# Patient Record
Sex: Female | Born: 1945 | Race: Black or African American | Hispanic: No | Marital: Married | Smoking: Never smoker
Health system: Southern US, Community
[De-identification: ages and names within clinical notes are randomized; demographics above are authoritative.]

## PROBLEM LIST (undated history)

## (undated) DIAGNOSIS — C801 Malignant (primary) neoplasm, unspecified: Secondary | ICD-10-CM

## (undated) DIAGNOSIS — C50919 Malignant neoplasm of unspecified site of unspecified female breast: Secondary | ICD-10-CM

## (undated) DIAGNOSIS — K56609 Unspecified intestinal obstruction, unspecified as to partial versus complete obstruction: Secondary | ICD-10-CM

## (undated) DIAGNOSIS — T783XXA Angioneurotic edema, initial encounter: Secondary | ICD-10-CM

## (undated) DIAGNOSIS — T4145XA Adverse effect of unspecified anesthetic, initial encounter: Secondary | ICD-10-CM

## (undated) DIAGNOSIS — J189 Pneumonia, unspecified organism: Secondary | ICD-10-CM

## (undated) DIAGNOSIS — Z923 Personal history of irradiation: Secondary | ICD-10-CM

## (undated) DIAGNOSIS — F419 Anxiety disorder, unspecified: Secondary | ICD-10-CM

## (undated) DIAGNOSIS — T8859XA Other complications of anesthesia, initial encounter: Secondary | ICD-10-CM

## (undated) HISTORY — PX: ABDOMINAL HYSTERECTOMY: SHX81

## (undated) HISTORY — PX: BREAST SURGERY: SHX581

## (undated) HISTORY — PX: APPENDECTOMY: SHX54

## (undated) HISTORY — PX: CYST EXCISION: SHX5701

## (undated) HISTORY — PX: CHOLECYSTECTOMY: SHX55

---

## 2004-04-25 ENCOUNTER — Ambulatory Visit (HOSPITAL_COMMUNITY): Admission: RE | Admit: 2004-04-25 | Discharge: 2004-04-25 | Payer: Self-pay | Admitting: Gastroenterology

## 2011-06-16 DIAGNOSIS — C50919 Malignant neoplasm of unspecified site of unspecified female breast: Secondary | ICD-10-CM

## 2011-06-16 HISTORY — DX: Malignant neoplasm of unspecified site of unspecified female breast: C50.919

## 2011-06-16 HISTORY — PX: BREAST LUMPECTOMY: SHX2

## 2014-08-13 ENCOUNTER — Inpatient Hospital Stay (HOSPITAL_COMMUNITY)
Admission: EM | Admit: 2014-08-13 | Discharge: 2014-08-14 | DRG: 390 | Disposition: A | Discharge status: Home / Self Care | Payer: Medicare HMO | Attending: Internal Medicine | Admitting: Internal Medicine

## 2014-08-13 ENCOUNTER — Emergency Department (HOSPITAL_COMMUNITY): Payer: Medicare HMO

## 2014-08-13 ENCOUNTER — Encounter (HOSPITAL_COMMUNITY): Payer: Self-pay | Admitting: *Deleted

## 2014-08-13 ENCOUNTER — Inpatient Hospital Stay (HOSPITAL_COMMUNITY): Payer: Medicare HMO

## 2014-08-13 DIAGNOSIS — K5669 Other intestinal obstruction: Secondary | ICD-10-CM

## 2014-08-13 DIAGNOSIS — Z880 Allergy status to penicillin: Secondary | ICD-10-CM

## 2014-08-13 DIAGNOSIS — Z8679 Personal history of other diseases of the circulatory system: Secondary | ICD-10-CM

## 2014-08-13 DIAGNOSIS — R7989 Other specified abnormal findings of blood chemistry: Secondary | ICD-10-CM

## 2014-08-13 DIAGNOSIS — Z853 Personal history of malignant neoplasm of breast: Secondary | ICD-10-CM

## 2014-08-13 DIAGNOSIS — R109 Unspecified abdominal pain: Secondary | ICD-10-CM | POA: Diagnosis present

## 2014-08-13 DIAGNOSIS — R030 Elevated blood-pressure reading, without diagnosis of hypertension: Secondary | ICD-10-CM | POA: Diagnosis present

## 2014-08-13 DIAGNOSIS — Z91018 Allergy to other foods: Secondary | ICD-10-CM

## 2014-08-13 DIAGNOSIS — Z885 Allergy status to narcotic agent status: Secondary | ICD-10-CM | POA: Diagnosis not present

## 2014-08-13 DIAGNOSIS — K565 Intestinal adhesions [bands] with obstruction (postprocedural) (postinfection): Principal | ICD-10-CM | POA: Diagnosis present

## 2014-08-13 DIAGNOSIS — E876 Hypokalemia: Secondary | ICD-10-CM | POA: Diagnosis present

## 2014-08-13 DIAGNOSIS — Z87898 Personal history of other specified conditions: Secondary | ICD-10-CM

## 2014-08-13 DIAGNOSIS — Z4659 Encounter for fitting and adjustment of other gastrointestinal appliance and device: Secondary | ICD-10-CM

## 2014-08-13 DIAGNOSIS — K56609 Unspecified intestinal obstruction, unspecified as to partial versus complete obstruction: Secondary | ICD-10-CM | POA: Diagnosis present

## 2014-08-13 HISTORY — DX: Unspecified intestinal obstruction, unspecified as to partial versus complete obstruction: K56.609

## 2014-08-13 HISTORY — DX: Malignant (primary) neoplasm, unspecified: C80.1

## 2014-08-13 HISTORY — DX: Malignant neoplasm of unspecified site of unspecified female breast: C50.919

## 2014-08-13 HISTORY — DX: Angioneurotic edema, initial encounter: T78.3XXA

## 2014-08-13 LAB — CBC WITH DIFFERENTIAL/PLATELET
BASOS ABS: 0 10*3/uL (ref 0.0–0.1)
Basophils Absolute: 0 10*3/uL (ref 0.0–0.1)
Basophils Relative: 0 % (ref 0–1)
Basophils Relative: 0 % (ref 0–1)
EOS PCT: 0 % (ref 0–5)
Eosinophils Absolute: 0 10*3/uL (ref 0.0–0.7)
Eosinophils Absolute: 0 10*3/uL (ref 0.0–0.7)
Eosinophils Relative: 0 % (ref 0–5)
HCT: 40.3 % (ref 36.0–46.0)
HEMATOCRIT: 42.5 % (ref 36.0–46.0)
Hemoglobin: 13.6 g/dL (ref 12.0–15.0)
Hemoglobin: 14.3 g/dL (ref 12.0–15.0)
LYMPHS ABS: 1.7 10*3/uL (ref 0.7–4.0)
LYMPHS PCT: 18 % (ref 12–46)
Lymphocytes Relative: 13 % (ref 12–46)
Lymphs Abs: 1.1 10*3/uL (ref 0.7–4.0)
MCH: 29.8 pg (ref 26.0–34.0)
MCH: 30.4 pg (ref 26.0–34.0)
MCHC: 33.7 g/dL (ref 30.0–36.0)
MCHC: 33.6 g/dL (ref 30.0–36.0)
MCV: 88.2 fL (ref 78.0–100.0)
MCV: 90.4 fL (ref 78.0–100.0)
MONO ABS: 0 10*3/uL — AB (ref 0.1–1.0)
Monocytes Absolute: 0.2 10*3/uL (ref 0.1–1.0)
Monocytes Relative: 2 % — ABNORMAL LOW (ref 3–12)
Monocytes Relative: 1 % — ABNORMAL LOW (ref 3–12)
NEUTROS PCT: 80 % — AB (ref 43–77)
Neutro Abs: 7.4 10*3/uL (ref 1.7–7.7)
Neutro Abs: 7.7 10*3/uL (ref 1.7–7.7)
Neutrophils Relative %: 86 % — ABNORMAL HIGH (ref 43–77)
PLATELETS: 342 10*3/uL (ref 150–400)
Platelets: 351 10*3/uL (ref 150–400)
RBC: 4.57 MIL/uL (ref 3.87–5.11)
RBC: 4.7 MIL/uL (ref 3.87–5.11)
RDW: 13.6 % (ref 11.5–15.5)
RDW: 13.4 % (ref 11.5–15.5)
WBC: 9.3 10*3/uL (ref 4.0–10.5)
WBC: 8.8 10*3/uL (ref 4.0–10.5)

## 2014-08-13 LAB — COMPREHENSIVE METABOLIC PANEL
ALBUMIN: 3.6 g/dL (ref 3.5–5.2)
ALBUMIN: 3.4 g/dL — AB (ref 3.5–5.2)
ALK PHOS: 85 U/L (ref 39–117)
ALT: 14 U/L (ref 0–35)
ALT: 14 U/L (ref 0–35)
ANION GAP: 9 (ref 5–15)
AST: 22 U/L (ref 0–37)
AST: 28 U/L (ref 0–37)
Alkaline Phosphatase: 85 U/L (ref 39–117)
Anion gap: 8 (ref 5–15)
BILIRUBIN TOTAL: 0.8 mg/dL (ref 0.3–1.2)
BUN: 7 mg/dL (ref 6–23)
BUN: <5 mg/dL — ABNORMAL LOW (ref 6–23)
CHLORIDE: 101 mmol/L (ref 96–112)
CHLORIDE: 104 mmol/L (ref 96–112)
CO2: 27 mmol/L (ref 19–32)
CO2: 28 mmol/L (ref 19–32)
CREATININE: 0.82 mg/dL (ref 0.50–1.10)
Calcium: 9.7 mg/dL (ref 8.4–10.5)
Calcium: 9.4 mg/dL (ref 8.4–10.5)
Creatinine, Ser: 0.78 mg/dL (ref 0.50–1.10)
GFR calc Af Amer: 82 mL/min — ABNORMAL LOW (ref >90)
GFR calc Af Amer: >90 mL/min (ref >90)
GFR calc non Af Amer: 71 mL/min — ABNORMAL LOW (ref >90)
GFR calc non Af Amer: 83 mL/min — ABNORMAL LOW (ref >90)
GLUCOSE: 174 mg/dL — AB (ref 70–99)
Glucose, Bld: 174 mg/dL — ABNORMAL HIGH (ref 70–99)
Potassium: 3.3 mmol/L — ABNORMAL LOW (ref 3.5–5.1)
Potassium: 3.3 mmol/L — ABNORMAL LOW (ref 3.5–5.1)
Sodium: 137 mmol/L (ref 135–145)
Sodium: 140 mmol/L (ref 135–145)
Total Bilirubin: 0.9 mg/dL (ref 0.3–1.2)
Total Protein: 7.7 g/dL (ref 6.0–8.3)
Total Protein: 7.8 g/dL (ref 6.0–8.3)

## 2014-08-13 LAB — GLUCOSE, CAPILLARY
GLUCOSE-CAPILLARY: 146 mg/dL — AB (ref 70–99)
GLUCOSE-CAPILLARY: 121 mg/dL — AB (ref 70–99)

## 2014-08-13 LAB — I-STAT CG4 LACTIC ACID, ED
Lactic Acid, Venous: 3.1 mmol/L (ref 0.5–2.0)
Lactic Acid, Venous: 1.8 mmol/L (ref 0.5–2.0)

## 2014-08-13 LAB — LIPASE, BLOOD: Lipase: 24 U/L (ref 11–59)

## 2014-08-13 LAB — LACTIC ACID, PLASMA
LACTIC ACID, VENOUS: 2.4 mmol/L — AB (ref 0.5–2.0)
Lactic Acid, Venous: 2 mmol/L (ref 0.5–2.0)

## 2014-08-13 MED ORDER — ACETAMINOPHEN 325 MG PO TABS: 650.0000 mg | ORAL_TABLET | Freq: Four times a day (QID) | ORAL | Status: DC | PRN

## 2014-08-13 MED ORDER — ACETAMINOPHEN 650 MG RE SUPP: 650.0000 mg | Freq: Four times a day (QID) | RECTAL | Status: DC | PRN

## 2014-08-13 MED ORDER — SODIUM CHLORIDE 0.9 % IV SOLN
1000.0000 mL | Freq: Once | INTRAVENOUS | Status: AC
  Administered 2014-08-13: 1000 mL via INTRAVENOUS

## 2014-08-13 MED ORDER — ONDANSETRON HCL 4 MG/2ML IJ SOLN: 4.0000 mg | Freq: Four times a day (QID) | INTRAMUSCULAR | Status: DC | PRN

## 2014-08-13 MED ORDER — ONDANSETRON 4 MG PO TBDP
ORAL_TABLET | ORAL | Status: AC
  Filled 2014-08-13: qty 2

## 2014-08-13 MED ORDER — KCL IN DEXTROSE-NACL 20-5-0.9 MEQ/L-%-% IV SOLN
INTRAVENOUS | Status: AC
  Administered 2014-08-13 – 2014-08-14 (×3): via INTRAVENOUS
  Filled 2014-08-13 (×4): qty 1000

## 2014-08-13 MED ORDER — IOHEXOL 300 MG/ML  SOLN
25.0000 mL | Freq: Once | INTRAMUSCULAR | Status: AC | PRN
  Administered 2014-08-13: 25 mL via ORAL

## 2014-08-13 MED ORDER — ONDANSETRON 4 MG PO TBDP
8.0000 mg | ORAL_TABLET | Freq: Once | ORAL | Status: AC
  Administered 2014-08-13: 8 mg via ORAL

## 2014-08-13 MED ORDER — DIATRIZOATE MEGLUMINE & SODIUM 66-10 % PO SOLN
90.0000 mL | Freq: Once | ORAL | Status: AC
  Administered 2014-08-13: 90 mL via ORAL

## 2014-08-13 MED ORDER — IOHEXOL 300 MG/ML  SOLN
100.0000 mL | Freq: Once | INTRAMUSCULAR | Status: AC | PRN
  Administered 2014-08-13: 100 mL via INTRAVENOUS

## 2014-08-13 MED ORDER — LIDOCAINE VISCOUS 2 % MT SOLN
15.0000 mL | Freq: Once | OROMUCOSAL | Status: AC
  Administered 2014-08-13: 15 mL via OROMUCOSAL
  Filled 2014-08-13: qty 15

## 2014-08-13 MED ORDER — SODIUM CHLORIDE 0.9 % IV SOLN
1000.0000 mL | INTRAVENOUS | Status: DC
  Administered 2014-08-13: 1000 mL via INTRAVENOUS

## 2014-08-13 MED ORDER — ONDANSETRON HCL 4 MG PO TABS: 4.0000 mg | ORAL_TABLET | Freq: Four times a day (QID) | ORAL | Status: DC | PRN

## 2014-08-13 MED ORDER — METHYLPREDNISOLONE SODIUM SUCC 125 MG IJ SOLR
125.0000 mg | Freq: Once | INTRAMUSCULAR | Status: AC
  Administered 2014-08-13: 125 mg via INTRAVENOUS
  Filled 2014-08-13: qty 2

## 2014-08-13 MED ORDER — MORPHINE SULFATE 2 MG/ML IJ SOLN: 1.0000 mg | INTRAMUSCULAR | Status: DC | PRN

## 2014-08-13 MED ORDER — DIPHENHYDRAMINE HCL 50 MG/ML IJ SOLN
25.0000 mg | Freq: Once | INTRAMUSCULAR | Status: AC
  Administered 2014-08-13: 25 mg via INTRAVENOUS
  Filled 2014-08-13: qty 1

## 2014-08-13 NOTE — ED Notes (Signed)
MD Glick at bedside. 

## 2014-08-13 NOTE — ED Notes (Signed)
Multiple attempts made by myself to place NG tube without success.  Dr Hal Hope notified.  States to let her rest and have the floor try to place the tube.  Dione Plover from [employer] notified.

## 2014-08-13 NOTE — ED Notes (Signed)
Ambulated to the bathroom without difficulty.  States my pain is coming back but I can't take anything for pain.

## 2014-08-13 NOTE — ED Notes (Signed)
Attempts made to place NG tube twice, patient not tolerating well. Paged admitting MD to request numbing medication.

## 2014-08-13 NOTE — Progress Notes (Signed)
CRITICAL VALUE ALERT  Critical value received:  Lactic acid 2.4  Date of notification:  08/13/14  Time of notification:  9562  Critical value read back: Yes  Nurse who received alert:  Mayra Neer, RN  MD notified (1st page):    Time of first page:    MD notified (2nd page):  Time of second page:  Responding MD:   Time MD responded:

## 2014-08-13 NOTE — ED Notes (Signed)
Pt transported to CT ?

## 2014-08-13 NOTE — ED Notes (Signed)
CT notified patient is done with contrast

## 2014-08-13 NOTE — ED Provider Notes (Signed)
CSN: [id number]     Arrival date & time 08/13/14  0036 History  This chart was scribed for Tracy Fuel, MD by Randa Evens, ED Scribe. This patient was seen in room B14C/B14C and the patient's care was started at 1:29 AM.    Chief Complaint  Patient presents with  . Abdominal Pain   Patient is a 69 y.o. female presenting with abdominal pain. The history is provided by the patient. No language interpreter was used.  Abdominal Pain Associated symptoms: constipation, nausea and vomiting   Associated symptoms: no diarrhea    HPI Comments: Nathasha Fiorillo is a 69 y.o. female with PSHx hysterectomy and cholecystectomy who presents to the Emergency Department complaining of severe abdominal pain onset 1 day prior after eating a grilled cheese sandwich. Pt states she has associated nausea, vomiting and abdominal distention. Pt states she has a Hx of angio edema that begins in her intestines and will begin to radiate up into her throat. Pt states she has a hx of flu symptoms 3 weeks prior. Pt denies any medications PTA. Pt denies recent BM or flatus. Pt states that her symptoms feel similar to previous angioedema that improved after receiving benadryl and Solu-medrol. Pt denies diarrhea, or other related symptoms.    Past Medical History  Diagnosis Date  . Cancer    History reviewed. No pertinent past surgical history. No family history on file. History  Substance Use Topics  . Smoking status: Never Smoker   . Smokeless tobacco: Not on file  . Alcohol Use: Yes   OB History    No data available     Review of Systems  Gastrointestinal: Positive for nausea, vomiting, abdominal pain, constipation and abdominal distention. Negative for diarrhea.  All other systems reviewed and are negative.   Allergies  Codeine; Morphine and related; Other; and Penicillins  Home Medications   Prior to Admission medications   Not on File   BP 154/75 mmHg  Pulse 96  Temp(Src) 98.8 F (37.1  C)  Resp 16  Ht 5' 7.5" (1.715 m)  Wt 258 lb (117.028 kg)  BMI 39.79 kg/m2  SpO2 96%   Physical Exam  Constitutional: She is oriented to person, place, and time. She appears well-developed and well-nourished. No distress.  Appears uncomfortable and  intermittently vomiting   HENT:  Head: Normocephalic and atraumatic.  Eyes: Conjunctivae and EOM are normal. Pupils are equal, round, and reactive to light.  Neck: Normal range of motion. Neck supple. No JVD present.  Cardiovascular: Normal rate, regular rhythm and normal heart sounds.   No murmur heard. Pulmonary/Chest: Effort normal and breath sounds normal. She has no wheezes. She has no rales.  Abdominal: Soft. She exhibits no distension and no mass. Bowel sounds are decreased. There is tenderness.  Moderate tenderness across upper abdomen   Musculoskeletal: Normal range of motion. She exhibits no edema.  Lymphadenopathy:    She has no cervical adenopathy.  Neurological: She is alert and oriented to person, place, and time. No cranial nerve deficit. Coordination normal.  Skin: Skin is warm and dry. No rash noted.  Psychiatric: She has a normal mood and affect. Her behavior is normal. Judgment and thought content normal.  Nursing note and vitals reviewed.   ED Course  Procedures (including critical care time) DIAGNOSTIC STUDIES: Oxygen Saturation is 98% on RA, normal by my interpretation.    COORDINATION OF CARE: 1:39 AM-Discussed treatment plan with pt at bedside and pt agreed to plan.  Labs Review Results for orders placed or performed during the hospital encounter of 08/13/14  CBC with Differential  Result Value Ref Range   WBC 9.3 4.0 - 10.5 K/uL   RBC 4.57 3.87 - 5.11 MIL/uL   Hemoglobin 13.6 12.0 - 15.0 g/dL   HCT 40.3 36.0 - 46.0 %   MCV 88.2 78.0 - 100.0 fL   MCH 29.8 26.0 - 34.0 pg   MCHC 33.7 30.0 - 36.0 g/dL   RDW 13.6 11.5 - 15.5 %   Platelets 342 150 - 400 K/uL   Neutrophils Relative % 80 (H) 43 - 77  %   Neutro Abs 7.4 1.7 - 7.7 K/uL   Lymphocytes Relative 18 12 - 46 %   Lymphs Abs 1.7 0.7 - 4.0 K/uL   Monocytes Relative 2 (L) 3 - 12 %   Monocytes Absolute 0.2 0.1 - 1.0 K/uL   Eosinophils Relative 0 0 - 5 %   Eosinophils Absolute 0.0 0.0 - 0.7 K/uL   Basophils Relative 0 0 - 1 %   Basophils Absolute 0.0 0.0 - 0.1 K/uL  Comprehensive metabolic panel  Result Value Ref Range   Sodium 137 135 - 145 mmol/L   Potassium 3.3 (L) 3.5 - 5.1 mmol/L   Chloride 101 96 - 112 mmol/L   CO2 27 19 - 32 mmol/L   Glucose, Bld 174 (H) 70 - 99 mg/dL   BUN 7 6 - 23 mg/dL   Creatinine, Ser 0.82 0.50 - 1.10 mg/dL   Calcium 9.7 8.4 - 10.5 mg/dL   Total Protein 7.7 6.0 - 8.3 g/dL   Albumin 3.6 3.5 - 5.2 g/dL   AST 22 0 - 37 U/L   ALT 14 0 - 35 U/L   Alkaline Phosphatase 85 39 - 117 U/L   Total Bilirubin 0.9 0.3 - 1.2 mg/dL   GFR calc non Af Amer 71 (L) >90 mL/min   GFR calc Af Amer 82 (L) >90 mL/min   Anion gap 9 5 - 15  Lipase, blood  Result Value Ref Range   Lipase 24 11 - 59 U/L  I-Stat CG4 Lactic Acid, ED  Result Value Ref Range   Lactic Acid, Venous 3.10 (HH) 0.5 - 2.0 mmol/L   Comment NOTIFIED PHYSICIAN    Imaging Review Ct Abdomen Pelvis W Contrast  08/13/2014   CLINICAL DATA:  Abdominal pain, site unspecified  EXAM: CT ABDOMEN AND PELVIS WITH CONTRAST  TECHNIQUE: Multidetector CT imaging of the abdomen and pelvis was performed using the standard protocol following bolus administration of intravenous contrast.  CONTRAST:  125mL OMNIPAQUE IOHEXOL 300 MG/ML  SOLN  COMPARISON:  None.  FINDINGS: BODY WALL: Unremarkable.  LOWER CHEST: Reticulation the lower lungs is likely atelectasis.  ABDOMEN/PELVIS:  Liver: No focal abnormality.  Biliary: Cholecystectomy.  No biliary dilatation  Pancreas: Unremarkable.  Spleen: Unremarkable.  Adrenals: Unremarkable.  Kidneys and ureters: No hydronephrosis or stone.  Bladder: Unremarkable.  Reproductive: Hysterectomy and probable left oophorectomy. No pathologic  findings  Bowel: Dilated and fluid-filled small bowel with mesenteric edema, leading to a transition point in the right lower quadrant. Bowel caliber change occurs on image 61 were there is non circumferential mural enhancement with mild subsequent submucosal edema. The distal small bowel and colon is decompressed. No evidence of pneumatosis or perforation. Distal colonic diverticulosis.  Retroperitoneum: No mass or adenopathy.  Peritoneum: Trace pelvic and perihepatic ascites  Vascular: No acute abnormality.  OSSEOUS: No acute abnormalities.  IMPRESSION: Small bowel obstruction with ileal  transition point. Focal mural enhancement at the transition point is likely reactive, although underlying lesion cannot be excluded.   Electronically Signed   By: Monte Fantasia M.D.   On: 08/13/2014 04:02   Images viewed by me.  MDM   Final diagnoses:  Abdominal pain, unspecified abdominal location  Elevated lactic acid level      Abdominal pain of uncertain cause. She does have a history of angioedema which has involved the abdomen. However, she is also had multiple abdominal surgeries and is at risk for small bowel obstruction from adhesions. She states she is unable to take any kind of narcotic analgesic because of severe vomiting even if given antiemetics. She is given dose of methylprednisolone and diphenhydramine and is sent for CT of abdomen and pelvis.  Patient feels significantly better or she is continuing to have abdominal pain. CT scan shows small bowel obstruction with transition point in the right lower quadrant consistent with adhesions from prior abdominal surgery. Lactic acid level has come back slightly elevated. She has been given IV hydration lactic acid level be repeated. Case is discussed with Dr. Hal Hope of [facility] who agrees to admit the patient. He is requested that general surgery be made aware of the patient and case is discussed with Dr. Grandville Silos of [facility]  surgery.     I personally performed the services described in this documentation, which was scribed in my presence. The recorded information has been reviewed and is accurate.       Tracy Fuel, MD 12/45/80 9983

## 2014-08-13 NOTE — ED Notes (Signed)
CG-4 reported to Dr. Roxanne Mins

## 2014-08-13 NOTE — ED Notes (Signed)
The pt is c/o abd pain since yesterday.  She has just recently gotten over the flu.  She feels like her abd is bloated    She has a mega colon.Marland Kitchen  n v today.  She has a history of angio edema and sometimes she has abd bloating with that.  Exertional sob that subsides with rest.

## 2014-08-13 NOTE — Progress Notes (Signed)
[facility] PROGRESS NOTE  Tracy Howell YQM:[id number] DOB: 1945/07/14 DOA: 08/13/2014 PCP: Elyn Peers, MD  Assessment/Plan: 69 y/o female PMH of Appendectomy, Cholecystectomy and c section x2, hereditary angioedema, breast cancer s/p lumpectomy in 2013 admitted with abdominal pain and vomiting and found to have SBO; Last BM was on Saturday, no flatus since.She had multiple unsuccessful NGT attempts, not will go NGT under radiology   1. SBO; CT abd: Small bowel obstruction with ileal transition point. Focal mural enhancement at the transition point is likely reactive, although underlying lesion cannot be excluded -failed bedside NTG placement, will place under radiology guidance; management per surgery, appreciate input -patient does not want any medications due to previous angioedema (although reports only vomiting to opioids  2. Hypokalemia; patient is getting potassium in her IV fluids. Replace and recheck. Hypokalemia probably from vomiting 3. History of angioedema; no symptoms at this time; patient was given 1 dose of IV Solu-Medrol by the ER physician. At this time we'll closely observe. 4. Elevated BP likely in the setting of SBO/Pain, patient denies h/o HTN; will monitor; prn hydralazine as needed   Code Status: full Family Communication: d/w patient (indicate person spoken with, relationship, and if by phone, the number) Disposition Plan: pend clinical impriovement    Consultants:  Surgery   Procedures:  NGT 2/29  Antibiotics:  none (indicate start date, and stop date if known)  HPI/Subjective: alert  Objective: Filed Vitals:   08/13/14 0700  BP: 168/81  Pulse: 104  Temp: 98.3 F (36.8 C)  Resp: 20   No intake or output data in the 24 hours ending 08/13/14 1127 Filed Weights   08/13/14 0042  Weight: 117.028 kg (258 lb)    Exam:   General:  alert  Cardiovascular: s1,s2 rrr  Respiratory: CTA BL  Abdomen: soft, distended, mild  tender  Musculoskeletal: no leg edema   Data Reviewed: Basic Metabolic Panel:  Recent Labs Lab 08/13/14 0102 08/13/14 0818  NA 137 140  K 3.3* 3.3*  CL 101 104  CO2 27 28  GLUCOSE 174* 174*  BUN 7 <5*  CREATININE 0.82 0.78  CALCIUM 9.7 9.4   Liver Function Tests:  Recent Labs Lab 08/13/14 0102 08/13/14 0818  AST 22 28  ALT 14 14  ALKPHOS 85 85  BILITOT 0.9 0.8  PROT 7.7 7.8  ALBUMIN 3.6 3.4*    Recent Labs Lab 08/13/14 0102  LIPASE 24   No results for input(s): AMMONIA in the last 168 hours. CBC:  Recent Labs Lab 08/13/14 0102 08/13/14 0818  WBC 9.3 8.8  NEUTROABS 7.4 7.7  HGB 13.6 14.3  HCT 40.3 42.5  MCV 88.2 90.4  PLT 342 351   Cardiac Enzymes: No results for input(s): CKTOTAL, CKMB, CKMBINDEX, TROPONINI in the last 168 hours. BNP (last 3 results) No results for input(s): BNP in the last 8760 hours.  ProBNP (last 3 results) No results for input(s): PROBNP in the last 8760 hours.  CBG: No results for input(s): GLUCAP in the last 168 hours.  No results found for this or any previous visit (from the past 240 hour(s)).   Studies: Ct Abdomen Pelvis W Contrast  08/13/2014   CLINICAL DATA:  Abdominal pain, site unspecified  EXAM: CT ABDOMEN AND PELVIS WITH CONTRAST  TECHNIQUE: Multidetector CT imaging of the abdomen and pelvis was performed using the standard protocol following bolus administration of intravenous contrast.  CONTRAST:  111mL OMNIPAQUE IOHEXOL 300 MG/ML  SOLN  COMPARISON:  None.  FINDINGS: BODY  WALL: Unremarkable.  LOWER CHEST: Reticulation the lower lungs is likely atelectasis.  ABDOMEN/PELVIS:  Liver: No focal abnormality.  Biliary: Cholecystectomy.  No biliary dilatation  Pancreas: Unremarkable.  Spleen: Unremarkable.  Adrenals: Unremarkable.  Kidneys and ureters: No hydronephrosis or stone.  Bladder: Unremarkable.  Reproductive: Hysterectomy and probable left oophorectomy. No pathologic findings  Bowel: Dilated and fluid-filled small  bowel with mesenteric edema, leading to a transition point in the right lower quadrant. Bowel caliber change occurs on image 61 were there is non circumferential mural enhancement with mild subsequent submucosal edema. The distal small bowel and colon is decompressed. No evidence of pneumatosis or perforation. Distal colonic diverticulosis.  Retroperitoneum: No mass or adenopathy.  Peritoneum: Trace pelvic and perihepatic ascites  Vascular: No acute abnormality.  OSSEOUS: No acute abnormalities.  IMPRESSION: Small bowel obstruction with ileal transition point. Focal mural enhancement at the transition point is likely reactive, although underlying lesion cannot be excluded.   Electronically Signed   By: Monte Fantasia M.D.   On: 08/13/2014 04:02    Scheduled Meds:  Continuous Infusions: . dextrose 5 % and 0.9 % NaCl with KCl 20 mEq/L      Principal Problem:   SBO (small bowel obstruction) Active Problems:   History of angioedema   History of breast cancer    Time spent: >35 minutes     Kinnie Feil  [facility] Pager [phone]. If 7PM-7AM, please contact night-coverage at [email or web address], password [email or web address] 08/13/2014, 11:27 AM  LOS: 0 days

## 2014-08-13 NOTE — H&P (Signed)
[facility] History and Physical  Tracy Howell GXQ:[id number] DOB: [birth year] DOA: 08/13/2014  Referring physician: ER physician. PCP: Tracy Peers, MD   Chief Complaint: Abdominal pain with nausea and vomiting.  HPI: Tracy Howell is a 69 y.o. female with history of angioedema and breast cancer status post surgery presents to the ER because of abdominal pain. Patient states abdominal pain started 2 days ago with nausea and vomiting and diarrhea. But subsequently patient has not had any bowel movements and had multiple episodes of nausea vomiting with persistent abdominal pain. In the ER patient's CT abdomen and pelvis shows small bowel obstruction with transition point. Patient is still having vomiting. At this time NG tube is in place and I have consulted Dr. Grandville Silos surgeon on call for further recommendations. Patient denies any chest pain or shortness of breath.   Review of Systems: As presented in the history of presenting illness, rest negative.  Past Medical History  Diagnosis Date  . Cancer   . Angioedema    Past Surgical History  Procedure Laterality Date  . Breast surgery    . Cholecystectomy    . Appendectomy     Social History:  reports that she has never smoked. She does not have any smokeless tobacco history on file. She reports that she does not drink alcohol. Her drug history is not on file. Where does patient live home. Can patient participate in ADLs? Yes.  Allergies  Allergen Reactions  . Codeine Nausea And Vomiting  . Morphine And Related Nausea And Vomiting  . Other Nausea And Vomiting    Onions,green peppers and mushrooms  . Penicillins Rash    vomiting    Family History:  Family History  Problem Relation Age of Onset  . Angioedema Mother       Prior to Admission medications   Not on File    Physical Exam: Filed Vitals:   08/13/14 0404 08/13/14 0430 08/13/14 0500 08/13/14 0530  BP: 169/83 142/80 163/50 151/85   Pulse: 117 100 104 103  Temp:      Resp: 34 18 24 20   Height:      Weight:      SpO2: 96% 98% 95% 96%     General:  Well-developed and nourished.  Eyes: Anicteric no pallor.  ENT: No discharge from the ears eyes nose and mouth.  Neck: No mass felt.  Cardiovascular: S1-S2 heard.  Respiratory: No rhonchi or crepitations.  Abdomen: Distended with bowel sounds not appreciated no guarding or rigidity.  Skin: No rash.  Musculoskeletal: No edema.  Psychiatric: Appears normal.  Neurologic: Alert awake oriented to time place and person. Moves all extremities.  Labs on Admission:  Basic Metabolic Panel:  Recent Labs Lab 08/13/14 0102  NA 137  K 3.3*  CL 101  CO2 27  GLUCOSE 174*  BUN 7  CREATININE 0.82  CALCIUM 9.7   Liver Function Tests:  Recent Labs Lab 08/13/14 0102  AST 22  ALT 14  ALKPHOS 85  BILITOT 0.9  PROT 7.7  ALBUMIN 3.6    Recent Labs Lab 08/13/14 0102  LIPASE 24   No results for input(s): AMMONIA in the last 168 hours. CBC:  Recent Labs Lab 08/13/14 0102  WBC 9.3  NEUTROABS 7.4  HGB 13.6  HCT 40.3  MCV 88.2  PLT 342   Cardiac Enzymes: No results for input(s): CKTOTAL, CKMB, CKMBINDEX, TROPONINI in the last 168 hours.  BNP (last 3 results) No results for input(s): BNP in the  last 8760 hours.  ProBNP (last 3 results) No results for input(s): PROBNP in the last 8760 hours.  CBG: No results for input(s): GLUCAP in the last 168 hours.  Radiological Exams on Admission: Ct Abdomen Pelvis W Contrast  08/13/2014   CLINICAL DATA:  Abdominal pain, site unspecified  EXAM: CT ABDOMEN AND PELVIS WITH CONTRAST  TECHNIQUE: Multidetector CT imaging of the abdomen and pelvis was performed using the standard protocol following bolus administration of intravenous contrast.  CONTRAST:  17mL OMNIPAQUE IOHEXOL 300 MG/ML  SOLN  COMPARISON:  None.  FINDINGS: BODY WALL: Unremarkable.  LOWER CHEST: Reticulation the lower lungs is likely  atelectasis.  ABDOMEN/PELVIS:  Liver: No focal abnormality.  Biliary: Cholecystectomy.  No biliary dilatation  Pancreas: Unremarkable.  Spleen: Unremarkable.  Adrenals: Unremarkable.  Kidneys and ureters: No hydronephrosis or stone.  Bladder: Unremarkable.  Reproductive: Hysterectomy and probable left oophorectomy. No pathologic findings  Bowel: Dilated and fluid-filled small bowel with mesenteric edema, leading to a transition point in the right lower quadrant. Bowel caliber change occurs on image 61 were there is non circumferential mural enhancement with mild subsequent submucosal edema. The distal small bowel and colon is decompressed. No evidence of pneumatosis or perforation. Distal colonic diverticulosis.  Retroperitoneum: No mass or adenopathy.  Peritoneum: Trace pelvic and perihepatic ascites  Vascular: No acute abnormality.  OSSEOUS: No acute abnormalities.  IMPRESSION: Small bowel obstruction with ileal transition point. Focal mural enhancement at the transition point is likely reactive, although underlying lesion cannot be excluded.   Electronically Signed   By: Monte Fantasia M.D.   On: 08/13/2014 04:02    Assessment/Plan Principal Problem:   SBO (small bowel obstruction) Active Problems:   History of angioedema   History of breast cancer   1. Small bowel obstruction - I have consulted Dr. Grandville Silos on-call surgeon who will be seeing patient in consult. Patient will be kept nothing by mouth and NG tube suction. Gently hydrate. Pain related medications. 2. Mild hypokalemia - patient is getting potassium in her IV fluids. Replace and recheck. Hypokalemia probably from vomiting. 3. History of angioedema - patient was given 1 dose of IV Solu-Medrol by the ER physician. At this time we'll closely observe. 4. History of breast cancer.   DVT Prophylaxis SCDs.  Code Status: Full code.  Family Communication: None.  Disposition Plan: Admit to inpatient.    Carlean Crowl N. [facility] Pager [phone].  If 7PM-7AM, please contact night-coverage [email or web address] Password [facility] 08/13/2014, 5:55 AM

## 2014-08-13 NOTE — Consult Note (Signed)
Reason for Consult: SBO Referring Physician: Dr. Gean Birchwood    HPI: Tracy Howell is a 69 year old female with a history of appendectomy, cholecystectomy and c section x2, angioedema, breast cancer s/p lumpectomy in 2013 admitted with abdominal pain and vomiting.  Symptoms initially started 3 weeks ago with diarrhea and vomiting, this resolved about 1 week ago.  The patient continued to consume a clear liquid diet until Saturday at which time she had a grilled cheese and developed bloating, pain and vomiting about 30 minutes afterwards.  Last BM was on Saturday, no flatus since.  No modifying factors.  No aggravating or alleviating factors.  Moderate in severity.  Location is generalized abdomen, more in the epigastric region.  She cannon tolerate any pain medication due to vomiting, discussed that its a common side effect, can give zofran, the patient declines pain medication at this time.    She continues to vomit bilious content.  She had multiple unsuccessful NGT attempts, now having some bleeding.  She denies fever or chills.  Denies recent weight loss.  Her work up shows SBO with transition point at the ileum.  We have therefore been asked to evaluate the patient.    Past Medical History  Diagnosis Date  . Cancer   . Angioedema   . Breast cancer 2013    Past Surgical History  Procedure Laterality Date  . Breast surgery    . Cholecystectomy    . Appendectomy    . Breast lumpectomy  2013  . Cesarean section      x2    Family History  Problem Relation Age of Onset  . Angioedema Mother     Social History:  reports that she has never smoked. She does not have any smokeless tobacco history on file. She reports that she does not drink alcohol or use illicit drugs.  Allergies:  Allergies  Allergen Reactions  . Codeine Nausea And Vomiting  . Morphine And Related Nausea And Vomiting  . Other Nausea And Vomiting    Onions,green peppers and mushrooms  . Penicillins Rash   vomiting    Medications:  No current facility-administered medications on file prior to encounter.   No current outpatient prescriptions on file prior to encounter.     Results for orders placed or performed during the hospital encounter of 08/13/14 (from the past 48 hour(s))  CBC with Differential     Status: Abnormal   Collection Time: 08/13/14  1:02 AM  Result Value Ref Range   WBC 9.3 4.0 - 10.5 K/uL   RBC 4.57 3.87 - 5.11 MIL/uL   Hemoglobin 13.6 12.0 - 15.0 g/dL   HCT 40.3 36.0 - 46.0 %   MCV 88.2 78.0 - 100.0 fL   MCH 29.8 26.0 - 34.0 pg   MCHC 33.7 30.0 - 36.0 g/dL   RDW 13.6 11.5 - 15.5 %   Platelets 342 150 - 400 K/uL   Neutrophils Relative % 80 (H) 43 - 77 %   Neutro Abs 7.4 1.7 - 7.7 K/uL   Lymphocytes Relative 18 12 - 46 %   Lymphs Abs 1.7 0.7 - 4.0 K/uL   Monocytes Relative 2 (L) 3 - 12 %   Monocytes Absolute 0.2 0.1 - 1.0 K/uL   Eosinophils Relative 0 0 - 5 %   Eosinophils Absolute 0.0 0.0 - 0.7 K/uL   Basophils Relative 0 0 - 1 %   Basophils Absolute 0.0 0.0 - 0.1 K/uL  Comprehensive metabolic panel  Status: Abnormal   Collection Time: 08/13/14  1:02 AM  Result Value Ref Range   Sodium 137 135 - 145 mmol/L   Potassium 3.3 (L) 3.5 - 5.1 mmol/L   Chloride 101 96 - 112 mmol/L   CO2 27 19 - 32 mmol/L   Glucose, Bld 174 (H) 70 - 99 mg/dL   BUN 7 6 - 23 mg/dL   Creatinine, Ser 0.82 0.50 - 1.10 mg/dL   Calcium 9.7 8.4 - 10.5 mg/dL   Total Protein 7.7 6.0 - 8.3 g/dL   Albumin 3.6 3.5 - 5.2 g/dL   AST 22 0 - 37 U/L   ALT 14 0 - 35 U/L   Alkaline Phosphatase 85 39 - 117 U/L   Total Bilirubin 0.9 0.3 - 1.2 mg/dL   GFR calc non Af Amer 71 (L) >90 mL/min   GFR calc Af Amer 82 (L) >90 mL/min    Comment: (NOTE) The eGFR has been calculated using the CKD EPI equation. This calculation has not been validated in all clinical situations. eGFR's persistently <90 mL/min signify possible Chronic Kidney Disease.    Anion gap 9 5 - 15  Lipase, blood     Status:  None   Collection Time: 08/13/14  1:02 AM  Result Value Ref Range   Lipase 24 11 - 59 U/L  I-Stat CG4 Lactic Acid, ED     Status: Abnormal   Collection Time: 08/13/14  1:44 AM  Result Value Ref Range   Lactic Acid, Venous 3.10 (HH) 0.5 - 2.0 mmol/L   Comment NOTIFIED PHYSICIAN   I-Stat CG4 Lactic Acid, ED     Status: None   Collection Time: 08/13/14  5:14 AM  Result Value Ref Range   Lactic Acid, Venous 1.80 0.5 - 2.0 mmol/L  Comprehensive metabolic panel     Status: Abnormal   Collection Time: 08/13/14  8:18 AM  Result Value Ref Range   Sodium 140 135 - 145 mmol/L   Potassium 3.3 (L) 3.5 - 5.1 mmol/L   Chloride 104 96 - 112 mmol/L   CO2 28 19 - 32 mmol/L   Glucose, Bld 174 (H) 70 - 99 mg/dL   BUN <5 (L) 6 - 23 mg/dL   Creatinine, Ser 0.78 0.50 - 1.10 mg/dL   Calcium 9.4 8.4 - 10.5 mg/dL   Total Protein 7.8 6.0 - 8.3 g/dL   Albumin 3.4 (L) 3.5 - 5.2 g/dL   AST 28 0 - 37 U/L   ALT 14 0 - 35 U/L   Alkaline Phosphatase 85 39 - 117 U/L   Total Bilirubin 0.8 0.3 - 1.2 mg/dL   GFR calc non Af Amer 83 (L) >90 mL/min   GFR calc Af Amer >90 >90 mL/min    Comment: (NOTE) The eGFR has been calculated using the CKD EPI equation. This calculation has not been validated in all clinical situations. eGFR's persistently <90 mL/min signify possible Chronic Kidney Disease.    Anion gap 8 5 - 15  CBC with Differential/Platelet     Status: Abnormal   Collection Time: 08/13/14  8:18 AM  Result Value Ref Range   WBC 8.8 4.0 - 10.5 K/uL   RBC 4.70 3.87 - 5.11 MIL/uL   Hemoglobin 14.3 12.0 - 15.0 g/dL   HCT 42.5 36.0 - 46.0 %   MCV 90.4 78.0 - 100.0 fL   MCH 30.4 26.0 - 34.0 pg   MCHC 33.6 30.0 - 36.0 g/dL   RDW 13.4 11.5 - 15.5 %  Platelets 351 150 - 400 K/uL   Neutrophils Relative % 86 (H) 43 - 77 %   Neutro Abs 7.7 1.7 - 7.7 K/uL   Lymphocytes Relative 13 12 - 46 %   Lymphs Abs 1.1 0.7 - 4.0 K/uL   Monocytes Relative 1 (L) 3 - 12 %   Monocytes Absolute 0.0 (L) 0.1 - 1.0 K/uL    Eosinophils Relative 0 0 - 5 %   Eosinophils Absolute 0.0 0.0 - 0.7 K/uL   Basophils Relative 0 0 - 1 %   Basophils Absolute 0.0 0.0 - 0.1 K/uL  Lactic acid, plasma     Status: None   Collection Time: 08/13/14  8:18 AM  Result Value Ref Range   Lactic Acid, Venous 2.0 0.5 - 2.0 mmol/L    Ct Abdomen Pelvis W Contrast  08/13/2014   CLINICAL DATA:  Abdominal pain, site unspecified  EXAM: CT ABDOMEN AND PELVIS WITH CONTRAST  TECHNIQUE: Multidetector CT imaging of the abdomen and pelvis was performed using the standard protocol following bolus administration of intravenous contrast.  CONTRAST:  121mL OMNIPAQUE IOHEXOL 300 MG/ML  SOLN  COMPARISON:  None.  FINDINGS: BODY WALL: Unremarkable.  LOWER CHEST: Reticulation the lower lungs is likely atelectasis.  ABDOMEN/PELVIS:  Liver: No focal abnormality.  Biliary: Cholecystectomy.  No biliary dilatation  Pancreas: Unremarkable.  Spleen: Unremarkable.  Adrenals: Unremarkable.  Kidneys and ureters: No hydronephrosis or stone.  Bladder: Unremarkable.  Reproductive: Hysterectomy and probable left oophorectomy. No pathologic findings  Bowel: Dilated and fluid-filled small bowel with mesenteric edema, leading to a transition point in the right lower quadrant. Bowel caliber change occurs on image 61 were there is non circumferential mural enhancement with mild subsequent submucosal edema. The distal small bowel and colon is decompressed. No evidence of pneumatosis or perforation. Distal colonic diverticulosis.  Retroperitoneum: No mass or adenopathy.  Peritoneum: Trace pelvic and perihepatic ascites  Vascular: No acute abnormality.  OSSEOUS: No acute abnormalities.  IMPRESSION: Small bowel obstruction with ileal transition point. Focal mural enhancement at the transition point is likely reactive, although underlying lesion cannot be excluded.   Electronically Signed   By: Monte Fantasia M.D.   On: 08/13/2014 04:02    Review of Systems  All other systems reviewed and  are negative.  Blood pressure 168/81, pulse 104, temperature 98.3 F (36.8 C), temperature source Oral, resp. rate 20, height 5' 7.5" (1.715 m), weight 258 lb (117.028 kg), SpO2 98 %. Physical Exam  Constitutional: She is oriented to person, place, and time. She appears well-developed and well-nourished. No distress.  HENT:  Head: Atraumatic.  Eyes: Right eye exhibits no discharge. Left eye exhibits no discharge. No scleral icterus.  Cardiovascular: Normal rate, regular rhythm, normal heart sounds and intact distal pulses.  Exam reveals no gallop and no friction rub.   No murmur heard. Respiratory: Effort normal and breath sounds normal. No respiratory distress. She has no wheezes. She has no rales. She exhibits no tenderness.  GI: Soft. Bowel sounds are normal. She exhibits distension. She exhibits no mass. There is tenderness. There is no rebound and no guarding.  Neurological: She is alert and oriented to person, place, and time.  Skin: Skin is warm and dry. No rash noted. She is not diaphoretic. No erythema. No pallor.  Psychiatric: She has a normal mood and affect. Her behavior is normal. Judgment and thought content normal.    Assessment/Plan: HD#1 SBO likely secondary to adhesions -will place NGT under fluoro -once inserted, will start  the SBO protocol---NGT to LWIS x2 hours, then give gastrografin, clamp x1 hour, repeat AXR 8 hours after gastrografin has been given.   -SCD, mobilize, may start chemical VTE prophylaxis -IVF, monitor electrolytes -pain control/antiemetics  Lyndon Chapel ANP-BC 08/13/2014, 9:45 AM

## 2014-08-14 ENCOUNTER — Encounter (HOSPITAL_COMMUNITY): Payer: Self-pay | Admitting: General Practice

## 2014-08-14 LAB — CBC
HEMATOCRIT: 37.6 % (ref 36.0–46.0)
Hemoglobin: 12.3 g/dL (ref 12.0–15.0)
MCH: 30.1 pg (ref 26.0–34.0)
MCHC: 32.7 g/dL (ref 30.0–36.0)
MCV: 91.9 fL (ref 78.0–100.0)
PLATELETS: 287 10*3/uL (ref 150–400)
RBC: 4.09 MIL/uL (ref 3.87–5.11)
RDW: 14 % (ref 11.5–15.5)
WBC: 10.2 10*3/uL (ref 4.0–10.5)

## 2014-08-14 LAB — GLUCOSE, CAPILLARY: Glucose-Capillary: 132 mg/dL — ABNORMAL HIGH (ref 70–99)

## 2014-08-14 LAB — BASIC METABOLIC PANEL
Anion gap: 10 (ref 5–15)
BUN: 9 mg/dL (ref 6–23)
CO2: 25 mmol/L (ref 19–32)
CREATININE: 0.77 mg/dL (ref 0.50–1.10)
Calcium: 8.4 mg/dL (ref 8.4–10.5)
Chloride: 108 mmol/L (ref 96–112)
GFR calc Af Amer: >90 mL/min (ref >90)
GFR calc non Af Amer: 83 mL/min — ABNORMAL LOW (ref >90)
Glucose, Bld: 123 mg/dL — ABNORMAL HIGH (ref 70–99)
Potassium: 3.5 mmol/L (ref 3.5–5.1)
Sodium: 143 mmol/L (ref 135–145)

## 2014-08-14 NOTE — Progress Notes (Signed)
Patient ID: Tracy Howell, female   DOB: [birth year], 69 y.o.   MRN: [id number]     [facility]      [address] [place]   [place], [place] [address]    Phone: [phone] FAX: [phone]     Subjective: NGT is out.  No n/v.  Having BMs.  SBO protocol done--contrast in colon.  Labs reviewed, normal.   Objective:  Vital signs:  Filed Vitals:   08/13/14 0649 08/13/14 0700 08/13/14 2127 08/14/14 0508  BP:  168/81 126/56 132/64  Pulse:  104 90 100  Temp: 98.8 F (37.1 C) 98.3 F (36.8 C) 98.5 F (36.9 C) 98.5 F (36.9 C)  TempSrc: Oral  Oral Oral  Resp:  20 19 18   Height:      Weight:      SpO2:  98% 94% 95%    Last BM Date: 08/14/14  Intake/Output   Yesterday:    This shift:      Physical Exam: General: Pt awake/alert/oriented x4 in no acute distress Abdomen: Soft.  Nondistended.  Non tender.  No evidence of peritonitis.  No incarcerated hernias.    Problem List:   Principal Problem:   SBO (small bowel obstruction) Active Problems:   History of angioedema   History of breast cancer    Results:   Labs: Results for orders placed or performed during the hospital encounter of 08/13/14 (from the past 48 hour(s))  CBC with Differential     Status: Abnormal   Collection Time: 08/13/14  1:02 AM  Result Value Ref Range   WBC 9.3 4.0 - 10.5 K/uL   RBC 4.57 3.87 - 5.11 MIL/uL   Hemoglobin 13.6 12.0 - 15.0 g/dL   HCT 08/15/14 87.9 - 23.2 %   MCV 88.2 78.0 - 100.0 fL   MCH 29.8 26.0 - 34.0 pg   MCHC 33.7 30.0 - 36.0 g/dL   RDW 49.2 76.4 - 79.3 %   Platelets 342 150 - 400 K/uL   Neutrophils Relative % 80 (H) 43 - 77 %   Neutro Abs 7.4 1.7 - 7.7 K/uL   Lymphocytes Relative 18 12 - 46 %   Lymphs Abs 1.7 0.7 - 4.0 K/uL   Monocytes Relative 2 (L) 3 - 12 %   Monocytes Absolute 0.2 0.1 - 1.0 K/uL   Eosinophils Relative 0 0 - 5 %   Eosinophils Absolute 0.0 0.0 - 0.7 K/uL   Basophils Relative 0 0 - 1 %   Basophils  Absolute 0.0 0.0 - 0.1 K/uL  Comprehensive metabolic panel     Status: Abnormal   Collection Time: 08/13/14  1:02 AM  Result Value Ref Range   Sodium 137 135 - 145 mmol/L   Potassium 3.3 (L) 3.5 - 5.1 mmol/L   Chloride 101 96 - 112 mmol/L   CO2 27 19 - 32 mmol/L   Glucose, Bld 174 (H) 70 - 99 mg/dL   BUN 7 6 - 23 mg/dL   Creatinine, Ser 08/15/14 0.50 - 1.10 mg/dL   Calcium 9.7 8.4 - 2.69 mg/dL   Total Protein 7.7 6.0 - 8.3 g/dL   Albumin 3.6 3.5 - 5.2 g/dL   AST 22 0 - 37 U/L   ALT 14 0 - 35 U/L   Alkaline Phosphatase 85 39 - 117 U/L   Total Bilirubin 0.9 0.3 - 1.2 mg/dL   GFR calc non Af Amer 71 (L) >90 mL/min   GFR calc Af Amer 82 (L) >  90 mL/min    Comment: (NOTE) The eGFR has been calculated using the CKD EPI equation. This calculation has not been validated in all clinical situations. eGFR's persistently <90 mL/min signify possible Chronic Kidney Disease.    Anion gap 9 5 - 15  Lipase, blood     Status: None   Collection Time: 08/13/14  1:02 AM  Result Value Ref Range   Lipase 24 11 - 59 U/L  I-Stat CG4 Lactic Acid, ED     Status: Abnormal   Collection Time: 08/13/14  1:44 AM  Result Value Ref Range   Lactic Acid, Venous 3.10 (HH) 0.5 - 2.0 mmol/L   Comment NOTIFIED PHYSICIAN   I-Stat CG4 Lactic Acid, ED     Status: None   Collection Time: 08/13/14  5:14 AM  Result Value Ref Range   Lactic Acid, Venous 1.80 0.5 - 2.0 mmol/L  Comprehensive metabolic panel     Status: Abnormal   Collection Time: 08/13/14  8:18 AM  Result Value Ref Range   Sodium 140 135 - 145 mmol/L   Potassium 3.3 (L) 3.5 - 5.1 mmol/L   Chloride 104 96 - 112 mmol/L   CO2 28 19 - 32 mmol/L   Glucose, Bld 174 (H) 70 - 99 mg/dL   BUN <5 (L) 6 - 23 mg/dL   Creatinine, Ser 2.00 0.50 - 1.10 mg/dL   Calcium 9.4 8.4 - 34.3 mg/dL   Total Protein 7.8 6.0 - 8.3 g/dL   Albumin 3.4 (L) 3.5 - 5.2 g/dL   AST 28 0 - 37 U/L   ALT 14 0 - 35 U/L   Alkaline Phosphatase 85 39 - 117 U/L   Total Bilirubin 0.8 0.3 -  1.2 mg/dL   GFR calc non Af Amer 83 (L) >90 mL/min   GFR calc Af Amer >90 >90 mL/min    Comment: (NOTE) The eGFR has been calculated using the CKD EPI equation. This calculation has not been validated in all clinical situations. eGFR's persistently <90 mL/min signify possible Chronic Kidney Disease.    Anion gap 8 5 - 15  CBC with Differential/Platelet     Status: Abnormal   Collection Time: 08/13/14  8:18 AM  Result Value Ref Range   WBC 8.8 4.0 - 10.5 K/uL   RBC 4.70 3.87 - 5.11 MIL/uL   Hemoglobin 14.3 12.0 - 15.0 g/dL   HCT 49.4 14.4 - 40.7 %   MCV 90.4 78.0 - 100.0 fL   MCH 30.4 26.0 - 34.0 pg   MCHC 33.6 30.0 - 36.0 g/dL   RDW 35.9 97.6 - 08.0 %   Platelets 351 150 - 400 K/uL   Neutrophils Relative % 86 (H) 43 - 77 %   Neutro Abs 7.7 1.7 - 7.7 K/uL   Lymphocytes Relative 13 12 - 46 %   Lymphs Abs 1.1 0.7 - 4.0 K/uL   Monocytes Relative 1 (L) 3 - 12 %   Monocytes Absolute 0.0 (L) 0.1 - 1.0 K/uL   Eosinophils Relative 0 0 - 5 %   Eosinophils Absolute 0.0 0.0 - 0.7 K/uL   Basophils Relative 0 0 - 1 %   Basophils Absolute 0.0 0.0 - 0.1 K/uL  Lactic acid, plasma     Status: None   Collection Time: 08/13/14  8:18 AM  Result Value Ref Range   Lactic Acid, Venous 2.0 0.5 - 2.0 mmol/L  Lactic acid, plasma     Status: Abnormal   Collection Time: 08/13/14 12:00 PM  Result Value Ref Range   Lactic Acid, Venous 2.4 (HH) 0.5 - 2.0 mmol/L    Comment: CRITICAL RESULT CALLED TO, READ BACK BY AND VERIFIED WITH: BUTLER R RN 08/13/14 1415 COSTELLO B REPEATED TO VERIFY   Glucose, capillary     Status: Abnormal   Collection Time: 08/13/14  6:14 PM  Result Value Ref Range   Glucose-Capillary 146 (H) 70 - 99 mg/dL  Glucose, capillary     Status: Abnormal   Collection Time: 08/13/14 11:10 PM  Result Value Ref Range   Glucose-Capillary 121 (H) 70 - 99 mg/dL   Comment 1 Notify RN   Glucose, capillary     Status: Abnormal   Collection Time: 08/14/14  5:06 AM  Result Value Ref Range    Glucose-Capillary 132 (H) 70 - 99 mg/dL   Comment 1 Notify RN   Basic metabolic panel     Status: Abnormal   Collection Time: 08/14/14  5:31 AM  Result Value Ref Range   Sodium 143 135 - 145 mmol/L   Potassium 3.5 3.5 - 5.1 mmol/L   Chloride 108 96 - 112 mmol/L   CO2 25 19 - 32 mmol/L   Glucose, Bld 123 (H) 70 - 99 mg/dL   BUN 9 6 - 23 mg/dL   Creatinine, Ser 1.14 0.50 - 1.10 mg/dL   Calcium 8.4 8.4 - 65.4 mg/dL   GFR calc non Af Amer 83 (L) >90 mL/min   GFR calc Af Amer >90 >90 mL/min    Comment: (NOTE) The eGFR has been calculated using the CKD EPI equation. This calculation has not been validated in all clinical situations. eGFR's persistently <90 mL/min signify possible Chronic Kidney Disease.    Anion gap 10 5 - 15  CBC     Status: None   Collection Time: 08/14/14  5:31 AM  Result Value Ref Range   WBC 10.2 4.0 - 10.5 K/uL   RBC 4.09 3.87 - 5.11 MIL/uL   Hemoglobin 12.3 12.0 - 15.0 g/dL   HCT 55.7 66.3 - 05.8 %   MCV 91.9 78.0 - 100.0 fL   MCH 30.1 26.0 - 34.0 pg   MCHC 32.7 30.0 - 36.0 g/dL   RDW 66.9 13.1 - 44.3 %   Platelets 287 150 - 400 K/uL    Imaging / Studies: Dg Abd 1 View  08/14/2014   CLINICAL DATA:  Follow-up small bowel obstruction. 8 hours status post Gastrografin administration. Initial encounter.  EXAM: ABDOMEN - 1 VIEW  COMPARISON:  None.  FINDINGS: Contrast has progressed into the sigmoid colon and rectum. There is no evidence for bowel obstruction.  No acute osseous abnormalities are seen. Contrast is seen partially filling the bladder.  IMPRESSION: Contrast now noted within the sigmoid colon and rectum. No evidence for bowel obstruction.   Electronically Signed   By: Roanna Raider M.D.   On: 08/14/2014 02:45   Dg Abd 1 View  08/13/2014   CLINICAL DATA:  NG tube insertion  EXAM: ABDOMEN - 1 VIEW  COMPARISON:  None.  FINDINGS: The nasogastric tube tip is in the stomach. The side-port appears to be just below the level of the GE junction.  IMPRESSION: 1.  Tip of nasogastric tube is in the stomach.   Electronically Signed   By: Signa Kell M.D.   On: 08/13/2014 13:20   Ct Abdomen Pelvis W Contrast  08/13/2014   CLINICAL DATA:  Abdominal pain, site unspecified  EXAM: CT ABDOMEN AND PELVIS WITH CONTRAST  TECHNIQUE: Multidetector CT imaging of the abdomen and pelvis was performed using the standard protocol following bolus administration of intravenous contrast.  CONTRAST:  137mL OMNIPAQUE IOHEXOL 300 MG/ML  SOLN  COMPARISON:  None.  FINDINGS: BODY WALL: Unremarkable.  LOWER CHEST: Reticulation the lower lungs is likely atelectasis.  ABDOMEN/PELVIS:  Liver: No focal abnormality.  Biliary: Cholecystectomy.  No biliary dilatation  Pancreas: Unremarkable.  Spleen: Unremarkable.  Adrenals: Unremarkable.  Kidneys and ureters: No hydronephrosis or stone.  Bladder: Unremarkable.  Reproductive: Hysterectomy and probable left oophorectomy. No pathologic findings  Bowel: Dilated and fluid-filled small bowel with mesenteric edema, leading to a transition point in the right lower quadrant. Bowel caliber change occurs on image 61 were there is non circumferential mural enhancement with mild subsequent submucosal edema. The distal small bowel and colon is decompressed. No evidence of pneumatosis or perforation. Distal colonic diverticulosis.  Retroperitoneum: No mass or adenopathy.  Peritoneum: Trace pelvic and perihepatic ascites  Vascular: No acute abnormality.  OSSEOUS: No acute abnormalities.  IMPRESSION: Small bowel obstruction with ileal transition point. Focal mural enhancement at the transition point is likely reactive, although underlying lesion cannot be excluded.   Electronically Signed   By: Monte Fantasia M.D.   On: 08/13/2014 04:02   Dg Loyce Dys Tube Plc W/fl-no Rad  08/13/2014   CLINICAL DATA:    NASO G TUBE PLACEMENT WITH FLUORO  Fluoroscopy was utilized by the requesting physician.  No radiographic  interpretation.     Medications / Allergies:  Scheduled  Meds:  Continuous Infusions:  PRN Meds:.acetaminophen **OR** acetaminophen, morphine injection, ondansetron **OR** ondansetron (ZOFRAN) IV  Antibiotics: Anti-infectives    None      Assessment/Plan HD#2 SBO likely secondary to adhesions -contrast in colon, having BMs, benign abdominal exam--resolved -start clear liquid diet and advance as tolerated -could DC later today if able to tolerate solids -surgery signing off.  Please call [facility] with questions or concerns.   Erby Pian, [facility] Pager [phone]) For consults and floor pages call [phone])  08/14/2014 8:46 AM

## 2014-08-14 NOTE — Progress Notes (Addendum)
[facility] PROGRESS NOTE  Tracy Howell CHY:[id number] DOB: 06-Feb-1946 DOA: 08/13/2014 PCP: Elyn Peers, MD  Assessment/Plan: 69 y/o female PMH of Appendectomy, Cholecystectomy and c section x2, hereditary angioedema, breast cancer s/p lumpectomy in 2013 admitted with abdominal pain and vomiting and found to have SBO;   1. SBO; CT abd: Small bowel obstruction with ileal transition point. Focal mural enhancement at the transition point is likely reactive, although underlying lesion cannot be excluded -SBO resolved with NGT; patient had BM, no new symptoms; will advance diet, possible discharge home today   2. Hypokalemia; replaced  3. History of angioedema; no symptoms at this time; patient was given 1 dose of IV Solu-Medrol by the ER physician 4. Elevated BP likely in the setting of SBO/Pain, resolved   Code Status: full Family Communication: d/w patient (indicate person spoken with, relationship, and if by phone, the number) Disposition Plan: pend clinical impriovement    Consultants:  Surgery   Procedures:  NGT 2/29  Antibiotics:  none (indicate start date, and stop date if known)  HPI/Subjective: alert  Objective: Filed Vitals:   08/14/14 0508  BP: 132/64  Pulse: 100  Temp: 98.5 F (36.9 C)  Resp: 18    Intake/Output Summary (Last 24 hours) at 08/14/14 1005 Last data filed at 08/13/14 1700  Gross per 24 hour  Intake      0 ml  Output      0 ml  Net      0 ml   Filed Weights   08/13/14 0042  Weight: 117.028 kg (258 lb)    Exam:   General:  alert  Cardiovascular: s1,s2 rrr  Respiratory: CTA BL  Abdomen: soft, distended, mild tender  Musculoskeletal: no leg edema   Data Reviewed: Basic Metabolic Panel:  Recent Labs Lab 08/13/14 0102 08/13/14 0818 08/14/14 0531  NA 137 140 143  K 3.3* 3.3* 3.5  CL 101 104 108  CO2 27 28 25   GLUCOSE 174* 174* 123*  BUN 7 <5* 9  CREATININE 0.82 0.78 0.77  CALCIUM 9.7 9.4 8.4    Liver Function Tests:  Recent Labs Lab 08/13/14 0102 08/13/14 0818  AST 22 28  ALT 14 14  ALKPHOS 85 85  BILITOT 0.9 0.8  PROT 7.7 7.8  ALBUMIN 3.6 3.4*    Recent Labs Lab 08/13/14 0102  LIPASE 24   No results for input(s): AMMONIA in the last 168 hours. CBC:  Recent Labs Lab 08/13/14 0102 08/13/14 0818 08/14/14 0531  WBC 9.3 8.8 10.2  NEUTROABS 7.4 7.7  --   HGB 13.6 14.3 12.3  HCT 40.3 42.5 37.6  MCV 88.2 90.4 91.9  PLT 342 351 287   Cardiac Enzymes: No results for input(s): CKTOTAL, CKMB, CKMBINDEX, TROPONINI in the last 168 hours. BNP (last 3 results) No results for input(s): BNP in the last 8760 hours.  ProBNP (last 3 results) No results for input(s): PROBNP in the last 8760 hours.  CBG:  Recent Labs Lab 08/13/14 1814 08/13/14 2310 08/14/14 0506  GLUCAP 146* 121* 132*    No results found for this or any previous visit (from the past 240 hour(s)).   Studies: Dg Abd 1 View  08/14/2014   CLINICAL DATA:  Follow-up small bowel obstruction. 8 hours status post Gastrografin administration. Initial encounter.  EXAM: ABDOMEN - 1 VIEW  COMPARISON:  None.  FINDINGS: Contrast has progressed into the sigmoid colon and rectum. There is no evidence for bowel obstruction.  No acute osseous abnormalities are seen.  Contrast is seen partially filling the bladder.  IMPRESSION: Contrast now noted within the sigmoid colon and rectum. No evidence for bowel obstruction.   Electronically Signed   By: Garald Balding M.D.   On: 08/14/2014 02:45   Dg Abd 1 View  08/13/2014   CLINICAL DATA:  NG tube insertion  EXAM: ABDOMEN - 1 VIEW  COMPARISON:  None.  FINDINGS: The nasogastric tube tip is in the stomach. The side-port appears to be just below the level of the GE junction.  IMPRESSION: 1. Tip of nasogastric tube is in the stomach.   Electronically Signed   By: Kerby Moors M.D.   On: 08/13/2014 13:20   Ct Abdomen Pelvis W Contrast  08/13/2014   CLINICAL DATA:  Abdominal  pain, site unspecified  EXAM: CT ABDOMEN AND PELVIS WITH CONTRAST  TECHNIQUE: Multidetector CT imaging of the abdomen and pelvis was performed using the standard protocol following bolus administration of intravenous contrast.  CONTRAST:  161mL OMNIPAQUE IOHEXOL 300 MG/ML  SOLN  COMPARISON:  None.  FINDINGS: BODY WALL: Unremarkable.  LOWER CHEST: Reticulation the lower lungs is likely atelectasis.  ABDOMEN/PELVIS:  Liver: No focal abnormality.  Biliary: Cholecystectomy.  No biliary dilatation  Pancreas: Unremarkable.  Spleen: Unremarkable.  Adrenals: Unremarkable.  Kidneys and ureters: No hydronephrosis or stone.  Bladder: Unremarkable.  Reproductive: Hysterectomy and probable left oophorectomy. No pathologic findings  Bowel: Dilated and fluid-filled small bowel with mesenteric edema, leading to a transition point in the right lower quadrant. Bowel caliber change occurs on image 61 were there is non circumferential mural enhancement with mild subsequent submucosal edema. The distal small bowel and colon is decompressed. No evidence of pneumatosis or perforation. Distal colonic diverticulosis.  Retroperitoneum: No mass or adenopathy.  Peritoneum: Trace pelvic and perihepatic ascites  Vascular: No acute abnormality.  OSSEOUS: No acute abnormalities.  IMPRESSION: Small bowel obstruction with ileal transition point. Focal mural enhancement at the transition point is likely reactive, although underlying lesion cannot be excluded.   Electronically Signed   By: Monte Fantasia M.D.   On: 08/13/2014 04:02   Dg Loyce Dys Tube Plc W/fl-no Rad  08/13/2014   CLINICAL DATA:    NASO G TUBE PLACEMENT WITH FLUORO  Fluoroscopy was utilized by the requesting physician.  No radiographic  interpretation.     Scheduled Meds:  Continuous Infusions:    Principal Problem:   SBO (small bowel obstruction) Active Problems:   History of angioedema   History of breast cancer    Time spent: >35 minutes     Kinnie Feil  [facility] Pager [phone]. If 7PM-7AM, please contact night-coverage at [email or web address], password [facility] 08/14/2014, 10:05 AM  LOS: 1 day

## 2014-08-14 NOTE — Discharge Summary (Signed)
Physician Discharge Summary  Tracy Howell KDX:[id number] DOB: Dec 08, 1945 DOA: 08/13/2014  PCP: Elyn Peers, MD  Admit date: 08/13/2014 Discharge date: 08/14/2014  Time spent: >35 minutes  Recommendations for Outpatient Follow-up:  F/u with PCP in 1 week as needed Discharge Diagnoses:  Principal Problem:   SBO (small bowel obstruction) Active Problems:   History of angioedema   History of breast cancer   Discharge Condition: stable   Diet recommendation: regular   Filed Weights   08/13/14 0042  Weight: 117.028 kg (258 lb)    History of present illness:  69 y/o female PMH of Appendectomy, Cholecystectomy and c section x2, hereditary angioedema, breast cancer s/p lumpectomy in 2013 admitted with abdominal pain and vomiting and found to have SBO; Last BM was on Saturday prior to admission; initially, patient had multiple unsuccessful NGT attempts, so NG tube was placed under radiology guidance   Hospital Course:  1. SBO; CT abd: Small bowel obstruction with ileal transition point. Focal mural enhancement at the transition point is likely reactive, although underlying lesion cannot be excluded -SBO resolved with NGT; patient had BM, no new symptoms; advancing diet, and possible discharge home today if tolerated diet well; continue outpatient follow up with GI to evaluate for screening colonoscopy   2. Hypokalemia; replaced  3. History of angioedema; no symptoms at this time; patient was given 1 dose of IV Solu-Medrol by the ER physician 4. Elevated BP likely in the setting of SBO/Pain, resolved   D/c plans today if tolerated diet well   Procedures:  NTG (i.e. Studies not automatically included, echos, thoracentesis, etc; not x-rays)  Consultations:  Surgery   Discharge Exam: Filed Vitals:   08/14/14 0508  BP: 132/64  Pulse: 100  Temp: 98.5 F (36.9 C)  Resp: 18    General: alert Cardiovascular: s1,s2 rrr Respiratory: CTA BL  Discharge  Instructions  Discharge Instructions    Diet - low sodium heart healthy    Complete by:  As directed      Discharge instructions    Complete by:  As directed   Please follow up with primary care doctor in 1 week as needed     Increase activity slowly    Complete by:  As directed             Medication List    Notice    You have not been prescribed any medications.     Allergies  Allergen Reactions  . Codeine Nausea And Vomiting  . Morphine And Related Nausea And Vomiting  . Other Nausea And Vomiting    Onions,green peppers and mushrooms  . Penicillins Rash    vomiting       Follow-up Information    Follow up with Elyn Peers, MD In 1 week.   Specialty:  Family Medicine   Contact information:   [address] [place] [place] [address] [phone]        The results of significant diagnostics from this hospitalization (including imaging, microbiology, ancillary and laboratory) are listed below for reference.    Significant Diagnostic Studies: Dg Abd 1 View  08/14/2014   CLINICAL DATA:  Follow-up small bowel obstruction. 8 hours status post Gastrografin administration. Initial encounter.  EXAM: ABDOMEN - 1 VIEW  COMPARISON:  None.  FINDINGS: Contrast has progressed into the sigmoid colon and rectum. There is no evidence for bowel obstruction.  No acute osseous abnormalities are seen. Contrast is seen partially filling the bladder.  IMPRESSION: Contrast now noted  within the sigmoid colon and rectum. No evidence for bowel obstruction.   Electronically Signed   By: Garald Balding M.D.   On: 08/14/2014 02:45   Dg Abd 1 View  08/13/2014   CLINICAL DATA:  NG tube insertion  EXAM: ABDOMEN - 1 VIEW  COMPARISON:  None.  FINDINGS: The nasogastric tube tip is in the stomach. The side-port appears to be just below the level of the GE junction.  IMPRESSION: 1. Tip of nasogastric tube is in the stomach.   Electronically Signed   By: Kerby Moors M.D.   On: 08/13/2014 13:20   Ct  Abdomen Pelvis W Contrast  08/13/2014   CLINICAL DATA:  Abdominal pain, site unspecified  EXAM: CT ABDOMEN AND PELVIS WITH CONTRAST  TECHNIQUE: Multidetector CT imaging of the abdomen and pelvis was performed using the standard protocol following bolus administration of intravenous contrast.  CONTRAST:  159mL OMNIPAQUE IOHEXOL 300 MG/ML  SOLN  COMPARISON:  None.  FINDINGS: BODY WALL: Unremarkable.  LOWER CHEST: Reticulation the lower lungs is likely atelectasis.  ABDOMEN/PELVIS:  Liver: No focal abnormality.  Biliary: Cholecystectomy.  No biliary dilatation  Pancreas: Unremarkable.  Spleen: Unremarkable.  Adrenals: Unremarkable.  Kidneys and ureters: No hydronephrosis or stone.  Bladder: Unremarkable.  Reproductive: Hysterectomy and probable left oophorectomy. No pathologic findings  Bowel: Dilated and fluid-filled small bowel with mesenteric edema, leading to a transition point in the right lower quadrant. Bowel caliber change occurs on image 61 were there is non circumferential mural enhancement with mild subsequent submucosal edema. The distal small bowel and colon is decompressed. No evidence of pneumatosis or perforation. Distal colonic diverticulosis.  Retroperitoneum: No mass or adenopathy.  Peritoneum: Trace pelvic and perihepatic ascites  Vascular: No acute abnormality.  OSSEOUS: No acute abnormalities.  IMPRESSION: Small bowel obstruction with ileal transition point. Focal mural enhancement at the transition point is likely reactive, although underlying lesion cannot be excluded.   Electronically Signed   By: Monte Fantasia M.D.   On: 08/13/2014 04:02   Dg Loyce Dys Tube Plc W/fl-no Rad  08/13/2014   CLINICAL DATA:    NASO G TUBE PLACEMENT WITH FLUORO  Fluoroscopy was utilized by the requesting physician.  No radiographic  interpretation.     Microbiology: No results found for this or any previous visit (from the past 240 hour(s)).   Labs: Basic Metabolic Panel:  Recent Labs Lab 08/13/14 0102  08/13/14 0818 08/14/14 0531  NA 137 140 143  K 3.3* 3.3* 3.5  CL 101 104 108  CO2 27 28 25   GLUCOSE 174* 174* 123*  BUN 7 <5* 9  CREATININE 0.82 0.78 0.77  CALCIUM 9.7 9.4 8.4   Liver Function Tests:  Recent Labs Lab 08/13/14 0102 08/13/14 0818  AST 22 28  ALT 14 14  ALKPHOS 85 85  BILITOT 0.9 0.8  PROT 7.7 7.8  ALBUMIN 3.6 3.4*    Recent Labs Lab 08/13/14 0102  LIPASE 24   No results for input(s): AMMONIA in the last 168 hours. CBC:  Recent Labs Lab 08/13/14 0102 08/13/14 0818 08/14/14 0531  WBC 9.3 8.8 10.2  NEUTROABS 7.4 7.7  --   HGB 13.6 14.3 12.3  HCT 40.3 42.5 37.6  MCV 88.2 90.4 91.9  PLT 342 351 287   Cardiac Enzymes: No results for input(s): CKTOTAL, CKMB, CKMBINDEX, TROPONINI in the last 168 hours. BNP: BNP (last 3 results) No results for input(s): BNP in the last 8760 hours.  ProBNP (last 3 results) No results  for input(s): PROBNP in the last 8760 hours.  CBG:  Recent Labs Lab 08/13/14 1814 08/13/14 2310 08/14/14 0506  GLUCAP 146* 121* 132*       Signed:  Rowe Clack N  [facility] 08/14/2014, 10:09 AM

## 2014-08-14 NOTE — Care Management (Signed)
An Important Message From Medicare About Your Rights , given to patient . Magdalen Spatz RN BSN

## 2014-10-09 ENCOUNTER — Other Ambulatory Visit: Payer: Self-pay | Admitting: Family Medicine

## 2014-10-09 ENCOUNTER — Ambulatory Visit
Admission: RE | Admit: 2014-10-09 | Discharge: 2014-10-09 | Disposition: A | Discharge status: Home / Self Care | Payer: Medicare Other | Source: Ambulatory Visit | Attending: Family Medicine | Admitting: Family Medicine

## 2014-10-09 DIAGNOSIS — J209 Acute bronchitis, unspecified: Secondary | ICD-10-CM

## 2015-09-20 DIAGNOSIS — J069 Acute upper respiratory infection, unspecified: Secondary | ICD-10-CM | POA: Diagnosis not present

## 2015-09-20 DIAGNOSIS — J209 Acute bronchitis, unspecified: Secondary | ICD-10-CM | POA: Diagnosis not present

## 2015-10-07 ENCOUNTER — Ambulatory Visit
Admission: RE | Admit: 2015-10-07 | Discharge: 2015-10-07 | Disposition: A | Discharge status: Home / Self Care | Payer: Medicare Other | Source: Ambulatory Visit | Attending: Family Medicine | Admitting: Family Medicine

## 2015-10-07 ENCOUNTER — Other Ambulatory Visit: Payer: Self-pay | Admitting: Family Medicine

## 2015-10-07 DIAGNOSIS — R05 Cough: Secondary | ICD-10-CM | POA: Diagnosis not present

## 2015-10-07 DIAGNOSIS — R053 Chronic cough: Secondary | ICD-10-CM

## 2016-06-30 DIAGNOSIS — Z853 Personal history of malignant neoplasm of breast: Secondary | ICD-10-CM | POA: Diagnosis not present

## 2016-06-30 DIAGNOSIS — J189 Pneumonia, unspecified organism: Secondary | ICD-10-CM | POA: Diagnosis not present

## 2016-06-30 DIAGNOSIS — J159 Unspecified bacterial pneumonia: Secondary | ICD-10-CM | POA: Diagnosis not present

## 2016-07-30 ENCOUNTER — Other Ambulatory Visit: Payer: Self-pay | Admitting: Family Medicine

## 2016-07-30 ENCOUNTER — Ambulatory Visit
Admission: RE | Admit: 2016-07-30 | Discharge: 2016-07-30 | Disposition: A | Discharge status: Home / Self Care | Payer: Medicare Other | Source: Ambulatory Visit | Attending: Family Medicine | Admitting: Family Medicine

## 2016-07-30 DIAGNOSIS — Z09 Encounter for follow-up examination after completed treatment for conditions other than malignant neoplasm: Secondary | ICD-10-CM

## 2016-07-30 DIAGNOSIS — J189 Pneumonia, unspecified organism: Secondary | ICD-10-CM | POA: Diagnosis not present

## 2016-08-30 DIAGNOSIS — R110 Nausea: Secondary | ICD-10-CM | POA: Diagnosis not present

## 2016-08-30 DIAGNOSIS — R61 Generalized hyperhidrosis: Secondary | ICD-10-CM | POA: Diagnosis not present

## 2016-08-30 DIAGNOSIS — R531 Weakness: Secondary | ICD-10-CM | POA: Diagnosis not present

## 2016-08-30 DIAGNOSIS — I10 Essential (primary) hypertension: Secondary | ICD-10-CM | POA: Diagnosis not present

## 2016-08-30 DIAGNOSIS — H538 Other visual disturbances: Secondary | ICD-10-CM | POA: Diagnosis not present

## 2016-08-30 DIAGNOSIS — H8113 Benign paroxysmal vertigo, bilateral: Secondary | ICD-10-CM | POA: Diagnosis not present

## 2016-08-30 DIAGNOSIS — R42 Dizziness and giddiness: Secondary | ICD-10-CM | POA: Diagnosis not present

## 2016-08-30 DIAGNOSIS — R270 Ataxia, unspecified: Secondary | ICD-10-CM | POA: Diagnosis not present

## 2016-08-30 DIAGNOSIS — R918 Other nonspecific abnormal finding of lung field: Secondary | ICD-10-CM | POA: Diagnosis not present

## 2016-09-08 DIAGNOSIS — R42 Dizziness and giddiness: Secondary | ICD-10-CM | POA: Diagnosis not present

## 2017-03-29 DIAGNOSIS — R921 Mammographic calcification found on diagnostic imaging of breast: Secondary | ICD-10-CM | POA: Diagnosis not present

## 2017-03-29 DIAGNOSIS — Z853 Personal history of malignant neoplasm of breast: Secondary | ICD-10-CM | POA: Diagnosis not present

## 2017-03-30 ENCOUNTER — Other Ambulatory Visit: Payer: Self-pay | Admitting: Radiology

## 2017-03-30 DIAGNOSIS — C50211 Malignant neoplasm of upper-inner quadrant of right female breast: Secondary | ICD-10-CM | POA: Diagnosis not present

## 2017-03-30 DIAGNOSIS — C50212 Malignant neoplasm of upper-inner quadrant of left female breast: Secondary | ICD-10-CM | POA: Diagnosis not present

## 2017-03-31 DIAGNOSIS — R928 Other abnormal and inconclusive findings on diagnostic imaging of breast: Secondary | ICD-10-CM | POA: Diagnosis not present

## 2017-04-13 ENCOUNTER — Encounter: Payer: Self-pay | Admitting: Genetics

## 2017-04-22 ENCOUNTER — Encounter: Payer: Self-pay | Admitting: Oncology

## 2017-04-30 ENCOUNTER — Encounter: Payer: Self-pay | Admitting: *Deleted

## 2017-05-07 ENCOUNTER — Other Ambulatory Visit: Payer: Self-pay | Admitting: *Deleted

## 2017-05-07 DIAGNOSIS — Z170 Estrogen receptor positive status [ER+]: Principal | ICD-10-CM

## 2017-05-07 DIAGNOSIS — C50212 Malignant neoplasm of upper-inner quadrant of left female breast: Secondary | ICD-10-CM | POA: Insufficient documentation

## 2017-05-12 ENCOUNTER — Ambulatory Visit
Admission: RE | Admit: 2017-05-12 | Discharge: 2017-05-12 | Disposition: A | Discharge status: Home / Self Care | Payer: Medicare Other | Source: Ambulatory Visit | Attending: Oncology | Admitting: Oncology

## 2017-05-12 ENCOUNTER — Encounter: Payer: Self-pay | Admitting: Radiation Oncology

## 2017-05-12 ENCOUNTER — Ambulatory Visit
Admission: RE | Admit: 2017-05-12 | Discharge: 2017-05-12 | Disposition: A | Discharge status: Home / Self Care | Payer: Medicare Other | Source: Ambulatory Visit | Attending: Radiation Oncology | Admitting: Radiation Oncology

## 2017-05-12 ENCOUNTER — Encounter: Payer: Self-pay | Admitting: Physical Therapy

## 2017-05-12 ENCOUNTER — Other Ambulatory Visit (HOSPITAL_BASED_OUTPATIENT_CLINIC_OR_DEPARTMENT_OTHER): Payer: Medicare Other

## 2017-05-12 ENCOUNTER — Ambulatory Visit: Payer: Medicare Other | Attending: Surgery | Admitting: Physical Therapy

## 2017-05-12 ENCOUNTER — Encounter: Payer: Self-pay | Admitting: *Deleted

## 2017-05-12 ENCOUNTER — Encounter: Payer: Self-pay | Admitting: Oncology

## 2017-05-12 ENCOUNTER — Ambulatory Visit (HOSPITAL_BASED_OUTPATIENT_CLINIC_OR_DEPARTMENT_OTHER): Payer: Medicare Other | Admitting: Oncology

## 2017-05-12 ENCOUNTER — Other Ambulatory Visit: Payer: Self-pay | Admitting: *Deleted

## 2017-05-12 ENCOUNTER — Ambulatory Visit: Payer: Self-pay | Admitting: Surgery

## 2017-05-12 VITALS — BP 156/77 | HR 102 | Temp 97.6°F | Resp 18 | Ht 67.5 in | Wt 250.4 lb

## 2017-05-12 DIAGNOSIS — C50212 Malignant neoplasm of upper-inner quadrant of left female breast: Secondary | ICD-10-CM

## 2017-05-12 DIAGNOSIS — Z170 Estrogen receptor positive status [ER+]: Principal | ICD-10-CM

## 2017-05-12 DIAGNOSIS — C50912 Malignant neoplasm of unspecified site of left female breast: Secondary | ICD-10-CM | POA: Diagnosis not present

## 2017-05-12 DIAGNOSIS — L723 Sebaceous cyst: Secondary | ICD-10-CM | POA: Diagnosis not present

## 2017-05-12 DIAGNOSIS — R293 Abnormal posture: Secondary | ICD-10-CM

## 2017-05-12 DIAGNOSIS — Z803 Family history of malignant neoplasm of breast: Secondary | ICD-10-CM | POA: Diagnosis not present

## 2017-05-12 DIAGNOSIS — N6489 Other specified disorders of breast: Secondary | ICD-10-CM | POA: Diagnosis not present

## 2017-05-12 DIAGNOSIS — Z853 Personal history of malignant neoplasm of breast: Secondary | ICD-10-CM | POA: Diagnosis not present

## 2017-05-12 LAB — COMPREHENSIVE METABOLIC PANEL
ALT: 10 U/L (ref 0–55)
ANION GAP: 10 meq/L (ref 3–11)
AST: 15 U/L (ref 5–34)
Albumin: 3.3 g/dL — ABNORMAL LOW (ref 3.5–5.0)
Alkaline Phosphatase: 90 U/L (ref 40–150)
BUN: 9.8 mg/dL (ref 7.0–26.0)
CO2: 26 mEq/L (ref 22–29)
CREATININE: 0.9 mg/dL (ref 0.6–1.1)
Calcium: 9.3 mg/dL (ref 8.4–10.4)
Chloride: 107 mEq/L (ref 98–109)
EGFR: >60 mL/min/{1.73_m2} (ref >60)
Glucose: 133 mg/dl (ref 70–140)
Potassium: 3.5 mEq/L (ref 3.5–5.1)
Sodium: 142 mEq/L (ref 136–145)
Total Bilirubin: 0.56 mg/dL (ref 0.20–1.20)
Total Protein: 7.3 g/dL (ref 6.4–8.3)

## 2017-05-12 LAB — CBC WITH DIFFERENTIAL/PLATELET
BASO%: 1 % (ref 0.0–2.0)
Basophils Absolute: 0 10*3/uL (ref 0.0–0.1)
EOS ABS: 0.2 10*3/uL (ref 0.0–0.5)
EOS%: 3.5 % (ref 0.0–7.0)
HCT: 39.6 % (ref 34.8–46.6)
HEMOGLOBIN: 13.1 g/dL (ref 11.6–15.9)
LYMPH#: 1.9 10*3/uL (ref 0.9–3.3)
LYMPH%: 40.6 % (ref 14.0–49.7)
MCH: 30.1 pg (ref 25.1–34.0)
MCHC: 33.1 g/dL (ref 31.5–36.0)
MCV: 91.1 fL (ref 79.5–101.0)
MONO#: 0.4 10*3/uL (ref 0.1–0.9)
MONO%: 8.5 % (ref 0.0–14.0)
NEUT#: 2.2 10*3/uL (ref 1.5–6.5)
NEUT%: 46.4 % (ref 38.4–76.8)
PLATELETS: 270 10*3/uL (ref 145–400)
RBC: 4.35 10*6/uL (ref 3.70–5.45)
RDW: 13.7 % (ref 11.2–14.5)
WBC: 4.7 10*3/uL (ref 3.9–10.3)

## 2017-05-12 MED ORDER — GADOBENATE DIMEGLUMINE 529 MG/ML IV SOLN: 20.0000 mL | Freq: Once | INTRAVENOUS | Status: DC | PRN

## 2017-05-12 NOTE — Therapy (Signed)
[place], [place], [address] Phone: [phone]   Fax:  [phone]  Physical Therapy Evaluation  Patient Details  Name: Tracy Howell MRN: [id number] Date of Birth: 25-Aug-1945 Referring Provider: Dr. Alphonsa Overall   Encounter Date: 05/12/2017  PT End of Session - 05/12/17 1303    Visit Number  1    Number of Visits  1    PT Start Time  1016    PT Stop Time  2831 Also saw pt from 1141-1201 for a total of 30 minutes    PT Time Calculation (min)  10 min    Activity Tolerance  Patient tolerated treatment well    Behavior During Therapy  [facility] for tasks assessed/performed       Past Medical History:  Diagnosis Date  . Angioedema   . Breast cancer ([place]) 2013  . Cancer ([place])   . SBO (small bowel obstruction) ([place]) 08/13/2014    Past Surgical History:  Procedure Laterality Date  . ABDOMINAL HYSTERECTOMY    . APPENDECTOMY    . BREAST LUMPECTOMY  2013  . BREAST SURGERY    . CESAREAN SECTION     x2  . CHOLECYSTECTOMY      There were no vitals filed for this visit.   Subjective Assessment - 05/12/17 1252    Subjective  Patient reports she is here today to be seen by her medical team for her left breast cancer.    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 The mass appears to be now involving her skin. She has a previous left breast cancer from 2012 which was grade 2 invasive cancer. It was treated with surgery but she declined chemo and radiation.    Patient Stated Goals  reduce lymphedema risk and learn post op shoulder ROM HEP    Currently in Pain?  Yes    Pain Score  -- varies    Pain Location  Breast    Pain Orientation  Left    Pain Descriptors / Indicators  Constant    Pain Type  Other (Comment) Active cancer pain    Pain Onset  More than a month ago    Pain Frequency  Constant    Aggravating Factors   Nothing    Pain Relieving Factors  Nothing    Multiple Pain Sites  No         [facility] PT Assessment - 05/12/17 0001      Assessment   Medical Diagnosis  Left breast cancer    Referring Provider  Dr. Alphonsa Overall    Onset Date/Surgical Date  03/30/17    Hand Dominance  Right    Prior Therapy  none      Precautions   Precautions  Other (comment)    Precaution Comments  active cancer      Restrictions   Weight Bearing Restrictions  No      Balance Screen   Has the patient fallen in the past 6 months  No    Has the patient had a decrease in activity level because of a fear of falling?   No    Is the patient reluctant to leave their home because of a fear of falling?   No  Home Environment   Living Environment  Private residence    Living Arrangements  Spouse/significant other    Available Help at Discharge  Family      Prior Function   Level of Independence  Independent    Vocation  Part time employment    Loss adjuster, chartered    Leisure  She does not exercise      Cognition   Overall Cognitive Status  Within Functional Limits for tasks assessed      Posture/Postural Control   Posture/Postural Control  Postural limitations    Postural Limitations  Rounded Shoulders;Forward head      ROM / Strength   AROM / PROM / Strength  AROM;Strength      AROM   AROM Assessment Site  Shoulder;Cervical    Right/Left Shoulder  Right;Left    Right Shoulder Extension  50 Degrees    Right Shoulder Flexion  146 Degrees    Right Shoulder ABduction  148 Degrees    Right Shoulder Internal Rotation  66 Degrees    Right Shoulder External Rotation  75 Degrees    Left Shoulder Extension  40 Degrees    Left Shoulder Flexion  140 Degrees    Left Shoulder ABduction  144 Degrees    Left Shoulder Internal Rotation  66 Degrees    Left Shoulder  External Rotation  85 Degrees    Cervical Flexion  WNL    Cervical Extension  WNL    Cervical - Right Side Bend  WNL    Cervical - Left Side Bend  WNL    Cervical - Right Rotation  WNL    Cervical - Left Rotation  WNL      Strength   Overall Strength  Within functional limits for tasks performed        LYMPHEDEMA/ONCOLOGY QUESTIONNAIRE - 05/12/17 1302      Type   Cancer Type  Left breast cancer      Lymphedema Assessments   Lymphedema Assessments  Upper extremities      Right Upper Extremity Lymphedema   10 cm Proximal to Olecranon Process  38.4 cm    Olecranon Process  29.5 cm    10 cm Proximal to Ulnar Styloid Process  28.2 cm    Just Proximal to Ulnar Styloid Process  19.7 cm    Across Hand at [employer]  21.2 cm    At [place] of 2nd Digit  7.4 cm      Left Upper Extremity Lymphedema   10 cm Proximal to Olecranon Process  41 cm    Olecranon Process  30.8 cm    10 cm Proximal to Ulnar Styloid Process  27.8 cm    Just Proximal to Ulnar Styloid Process  20.3 cm    Across Hand at [employer]  20.9 cm    At [place] of 2nd Digit  7.2 cm          Objective measurements completed on examination: See above findings.      Patient was instructed today in a home exercise program today for post op shoulder range of motion. These included active assist shoulder flexion in sitting, scapular retraction, wall walking with shoulder abduction, and hands behind head external rotation.  She was encouraged to do these twice a day, holding 3 seconds and repeating 5 times when permitted by her physician.            PT Education - 05/12/17 1303    Education  provided  Yes    Education Details  Lymphedema risk reduction and post op shoulder ROM HEP    Person(s) Educated  Patient;Spouse    Methods  Explanation;Demonstration;Handout    Comprehension  Returned demonstration           Breast Clinic Goals - 05/12/17 1309      Patient will be able to verbalize  understanding of pertinent lymphedema risk reduction practices relevant to her diagnosis specifically related to skin care.   Time  1    Period  Days    Status  Achieved      Patient will be able to return demonstrate and/or verbalize understanding of the post-op home exercise program related to regaining shoulder range of motion.   Time  1    Period  Days    Status  Achieved      Patient will be able to verbalize understanding of the importance of attending the postoperative After Breast Cancer Class for further lymphedema risk reduction education and therapeutic exercise.   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 It is triple positive with a Ki67 of 70% and measures > 5 cm located in the upper inner quadrant. The mass appears to be now involving her skin. She has a previous left breast cancer from 2012 which was grade 2 invasive cancer. It was treated with surgery but she declined chemo and radiation. Her multidisciplinary medical team met prior to her assessments to determine a recommended treatment plan. She is planning to have staging scans, an MRI, neoadjuvant chemotherapy followed by a left mastectomy and an attempt to do a sentinel node biopsy but may need more extensive axillary surgery and then radiation. She will benefit from a post op PT visit to determine needs.    History and Personal Factors relevant to plan of care:  Previous left breast cancer; unknown extent of disease; lives in [place], [place] but is here for treatment    Clinical Presentation  Evolving    Clinical Presentation due to:  Previous left breast cancer without completion of recommended treatment; unknown extent of disease    Clinical Decision Making  Moderate    Rehab Potential  Good    Clinical Impairments Affecting Rehab Potential  Unknown extent  of disease    PT Frequency  One time visit    PT Treatment/Interventions  ADLs/Self Care Home Management;Patient/family education;Therapeutic exercise    PT Next Visit Plan  Will reassess after surgery    PT Home Exercise Plan  post op shoulder ROM HEP    Consulted and Agree with Plan of Care  Patient;Family member/caregiver    Family Member Consulted  Husband and family friend       Patient will benefit from skilled therapeutic intervention in order to improve the following deficits and impairments:  Pain, Postural dysfunction, Impaired UE functional use, Decreased knowledge of precautions, Decreased range of motion  Visit Diagnosis: Malignant neoplasm of upper-inner quadrant of left breast in female, estrogen receptor positive ([place]) - Plan: PT plan of care cert/re-cert  Abnormal posture - Plan: PT plan of care cert/re-cert  G-Codes - 09/98/33 1309    Functional Assessment Tool Used (Outpatient Only)  Clinical Judgement    Functional Limitation  Other PT  primary    Other PT Primary Current Status [phone])  At least 20 percent but less than 40 percent impaired, limited or restricted    Other PT Primary Goal Status ([id number])  At least 20 percent but less than 40 percent impaired, limited or restricted    Other PT Primary Discharge Status [phone])  At least 20 percent but less than 40 percent impaired, limited or restricted      Patient will follow up at outpatient cancer rehab 3-4 weeks following surgery.  If the patient requires physical therapy at that time, a specific plan will be dictated and sent to the referring physician for approval. The patient was educated today on appropriate basic range of motion exercises to begin post operatively and the importance of attending the After Breast Cancer class following surgery.  Patient was educated today on lymphedema risk reduction practices as it pertains to recommendations that will benefit the patient immediately following surgery.  She  verbalized good understanding.     Problem List Patient Active Problem List   Diagnosis Date Noted  . Malignant neoplasm of upper-inner quadrant of left breast in female, estrogen receptor positive ([place]) 05/07/2017  . SBO (small bowel obstruction) ([place]) 08/13/2014  . History of angioedema 08/13/2014  . History of breast cancer 08/13/2014    Annia Friendly, PT 05/12/17 1:11 PM  [place], [place], [address] Phone: [phone]   Fax:  [phone]  Name: Tracy Howell MRN: [id number] Date of Birth: [birth year]

## 2017-05-12 NOTE — Progress Notes (Signed)
[place]  [phone] [phone]) [phone]     ID: Tracy Howell DOB: 1945/08/14  MR#: [id number]  HCW#:[id number]  Patient Care Team: Tracy Lei, MD as PCP - General (Family Medicine) Tracy Overall, MD as Consulting Physician (General Surgery) Tracy Howell, Tracy Dad, MD as Consulting Physician (Oncology) Tracy Gibson, MD as Attending Physician (Radiation Oncology) Tracy Craver, MD as Consulting Physician (Gastroenterology) Tracy Cruel, MD OTHER MD:  CHIEF COMPLAINT: triple positive breast cancer  CURRENT TREATMENT:    HISTORY OF CURRENT ILLNESS: "Tracy Howell" tells me she underwent left lumpectomy in [place] on 2012 for a 2.5 cm, grade 2 breast cancer involving one lymph node of 5 sampled. [ It may have been only 2 lymph nodes that were removed she says.]  This was at the [facility] and [facility], currently the [place] [facility] on [address].  The patient says she was told could have more treatment if she wanted it.  She did not see any sense in it.  She feels her left breast cancer was caused by the fact that she kept her iPhone in her bra right by the left breast.  Sometime in April 2018 she noted a new mass in the left breast. She did not immediately bring it to medical attention but as it continued to grow she mentioned her to her primary care physician and on 03/29/2017 Tracy Howell underwent bilateral diagnostic mammography with tomography and left breast ultrasonography at [facility].  The breast density was category B.  In the left breast central to the nipple there was a 6 cm irregular mass with indistinct margins and heterogeneous calcifications.  By ultrasound this measured 5 cm, with indistinct margins, at the 12:00 anterior area.  There was associated edema.  The left axilla was sonographically benign.  Biopsy of the left breast area in question March 30, 2017 showed ([id number]) invasive ductal carcinoma,  grade 3, estrogen receptor 100% positive, progesterone receptor 60% positive, both with strong staining intensity, HER-2 amplified, with a signals ratio of 3.38, and the number per cell 7.95.  The  MIB-1 was 70%.  The patient's subsequent history is as detailed below.  INTERVAL HISTORY: Tracy Howell was evaluated in the multidisciplinary breast cancer clinic 05/12/2017 accompanied by her husband Tracy Howell and her friend Tracy Howell. Her case was also presented at the multidisciplinary breast cancer conference on the same day. At that time a preliminary plan was proposed: Genetics testing, staging studies including breast MRI, neoadjuvant chemo therapy with anti-HER-2 treatment, consideration of adjuvant radiation after surgery.   REVIEW OF SYSTEMS: Aside from the mass itself, there were no specific symptoms leading to thediagnostic mammogram.  Sometimes when she is using her cell phone she feels strange feelings in the left breast area, like shooting pains.  The patient denies unusual headaches, visual changes, nausea, vomiting, stiff neck, dizziness, or gait imbalance. There has been no cough, phlegm production, or pleurisy, no chest pain or pressure, and no change in bowel or bladder habits. The patient denies fever, rash, bleeding, unexplained fatigue or unexplained weight loss. A detailed review of systems was otherwise entirely negative.   PAST MEDICAL HISTORY: Past Medical History:  Diagnosis Date  . Angioedema   . Breast cancer ([place]) 2013  . Cancer ([place])   . SBO (small bowel obstruction) ([place]) 08/13/2014    PAST SURGICAL HISTORY: Past Surgical History:  Procedure Laterality Date  . ABDOMINAL HYSTERECTOMY    . APPENDECTOMY    . BREAST LUMPECTOMY  2013  .  BREAST SURGERY    . CESAREAN SECTION     x2  . CHOLECYSTECTOMY      FAMILY HISTORY Family History  Problem Relation Age of Onset  . Angioedema Mother   . Breast cancer Mother   . Breast cancer Maternal Aunt   The patient  was adopted.  Her adoptive parents died in their 50s.  The patient's birth mother however died in her 61s from breast cancer.  The patient believes she has 2 brothers is not sure about sisters.  However she does have 2 maternal aunts who had breast cancer.  There is no history of ovarian cancer in the family to her knowledge.  The patient has no information regarding her father or his side of the  GYNECOLOGIC HISTORY:  No LMP recorded. Patient is postmenopausal. Menarche age 28, first live birth age 71, she is [place] P2.  She underwent hysterectomy at age 9.  She tells me they left a fourth of an ovary at that time.  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  She actually lives in [place], and spends 2 weeks here every 2 months at her job.  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  She recently married Tracy Howell who is a Camera operator.  He has 2 children of his own, in [place] and [place].  The patient's own children are Tracy Howell who lives in [place] and works in Engineer, technical sales, and [employer] lives in [place] and owns a [place].  The patient has no grandchildren.  She is 1/7-day [facility].  (She tells me her religion has no bands on any medical treatments but she cannot have alcohol, cigarettes, or pork).    ADVANCED DIRECTIVES:    HEALTH MAINTENANCE: Social History   Tobacco Use  . Smoking status: Never Smoker  . Smokeless tobacco: Never Used  Substance Use Topics  . Alcohol use: No  . Drug use: No     Colonoscopy:  PAP:  Bone density:   Allergies  Allergen Reactions  . Codeine Nausea And Vomiting  . Morphine And Related Nausea And Vomiting  . Other Nausea And Vomiting    Onions,green peppers and mushrooms  . Penicillins Rash    vomiting    Current Outpatient  Medications  Medication Sig Dispense Refill  . Ascorbic Acid (VITAMIN C ER PO) Take 1 tablet by mouth daily.    . Multiple Vitamins-Minerals (MULTIVITAMIN ADULT PO) Take 1 tablet by mouth daily.    . naproxen sodium (ALEVE) 220 MG tablet Take 220 mg by mouth as needed.    Marland Kitchen VITAMIN B COMPLEX-C PO Take 1 tablet by mouth daily.     No current facility-administered medications for this visit.    Facility-Administered Medications Ordered in Other Visits  Medication Dose Route Frequency Provider Last Rate Last Dose  . gadobenate dimeglumine (MULTIHANCE) injection 20 mL  20 mL Intravenous Once PRN Lennon Boutwell, Tracy Dad, MD        OBJECTIVE: Middle-aged African-American woman who appears younger than stated age  71:   05/12/17 0831  BP: (!) 156/77  Pulse: (!) 102  Resp: 18  Temp: 97.6 F (36.4 C)  SpO2: 98%     Body mass index is 38.64 kg/m.   Wt Readings from Last 3 Encounters:  05/12/17 250  lb 6.4 oz (113.6 kg)  08/13/14 258 lb (117 kg)      ECOG FS:1 - Symptomatic but completely ambulatory  Ocular: Sclerae unicteric, pupils round and equal Ear-nose-throat: Oropharynx clear and moist Lymphatic: No cervical or supraclavicular adenopathy Lungs no rales or rhonchi Heart regular rate and rhythm Abd soft, nontender, positive bowel sounds MSK no focal spinal tenderness, no joint edema Neuro: non-focal, well-oriented, appropriate affect Breasts: The right breast is unremarkable.  The left breast is imaged below.  There is obvious skin involvement although no ulceration.  Both axilla are benign  Left breast 05/12/2017   LAB RESULTS:  CMP     Component Value Date/Time   NA 142 05/12/2017 0808   K 3.5 05/12/2017 0808   CL 108 08/14/2014 0531   CO2 26 05/12/2017 0808   GLUCOSE 133 05/12/2017 0808   BUN 9.8 05/12/2017 0808   CREATININE 0.9 05/12/2017 0808   CALCIUM 9.3 05/12/2017 0808   PROT 7.3 05/12/2017 0808   ALBUMIN 3.3 (L) 05/12/2017 0808   AST 15 05/12/2017 0808    ALT 10 05/12/2017 0808   ALKPHOS 90 05/12/2017 0808   BILITOT 0.56 05/12/2017 0808   GFRNONAA 83 (L) 08/14/2014 0531   GFRAA >90 08/14/2014 0531    No results found for: TOTALPROTELP, ALBUMINELP, A1GS, A2GS, BETS, BETA2SER, GAMS, MSPIKE, SPEI  No results found for: Nils Pyle, [facility]  Lab Results  Component Value Date   WBC 4.7 05/12/2017   NEUTROABS 2.2 05/12/2017   HGB 13.1 05/12/2017   HCT 39.6 05/12/2017   MCV 91.1 05/12/2017   PLT 270 05/12/2017      Chemistry      Component Value Date/Time   NA 142 05/12/2017 0808   K 3.5 05/12/2017 0808   CL 108 08/14/2014 0531   CO2 26 05/12/2017 0808   BUN 9.8 05/12/2017 0808   CREATININE 0.9 05/12/2017 0808      Component Value Date/Time   CALCIUM 9.3 05/12/2017 0808   ALKPHOS 90 05/12/2017 0808   AST 15 05/12/2017 0808   ALT 10 05/12/2017 0808   BILITOT 0.56 05/12/2017 0808       No results found for: LABCA2  No components found for: AOZHYQ657  No results for input(s): INR in the last 168 hours.  No results found for: LABCA2  No results found for: QIO962  No results found for: XBM841  No results found for: LKG401  No results found for: CA2729  No components found for: HGQUANT  No results found for: CEA1 / No results found for: CEA1   No results found for: AFPTUMOR  No results found for: [place]  No results found for: PSA1  Appointment on 05/12/2017  Component Date Value Ref Range Status  . WBC 05/12/2017 4.7  3.9 - 10.3 10e3/uL Final  . NEUT# 05/12/2017 2.2  1.5 - 6.5 10e3/uL Final  . HGB 05/12/2017 13.1  11.6 - 15.9 g/dL Final  . HCT 05/12/2017 39.6  34.8 - 46.6 % Final  . Platelets 05/12/2017 270  145 - 400 10e3/uL Final  . MCV 05/12/2017 91.1  79.5 - 101.0 fL Final  . MCH 05/12/2017 30.1  25.1 - 34.0 pg Final  . MCHC 05/12/2017 33.1  31.5 - 36.0 g/dL Final  . RBC 05/12/2017 4.35  3.70 - 5.45 10e6/uL Final  . RDW 05/12/2017 13.7  11.2 - 14.5 % Final  . lymph# 05/12/2017  1.9  0.9 - 3.3 10e3/uL Final  . MONO# 05/12/2017 0.4  0.1 - 0.9  10e3/uL Final  . Eosinophils Absolute 05/12/2017 0.2  0.0 - 0.5 10e3/uL Final  . Basophils Absolute 05/12/2017 0.0  0.0 - 0.1 10e3/uL Final  . NEUT% 05/12/2017 46.4  38.4 - 76.8 % Final  . LYMPH% 05/12/2017 40.6  14.0 - 49.7 % Final  . MONO% 05/12/2017 8.5  0.0 - 14.0 % Final  . EOS% 05/12/2017 3.5  0.0 - 7.0 % Final  . BASO% 05/12/2017 1.0  0.0 - 2.0 % Final  . Sodium 05/12/2017 142  136 - 145 mEq/L Final  . Potassium 05/12/2017 3.5  3.5 - 5.1 mEq/L Final  . Chloride 05/12/2017 107  98 - 109 mEq/L Final  . CO2 05/12/2017 26  22 - 29 mEq/L Final  . Glucose 05/12/2017 133  70 - 140 mg/dl Final   Glucose reference range is for nonfasting patients. Fasting glucose reference range is 70- 100.  Marland Kitchen BUN 05/12/2017 9.8  7.0 - 26.0 mg/dL Final  . Creatinine 05/12/2017 0.9  0.6 - 1.1 mg/dL Final  . Total Bilirubin 05/12/2017 0.56  0.20 - 1.20 mg/dL Final  . Alkaline Phosphatase 05/12/2017 90  40 - 150 U/L Final  . AST 05/12/2017 15  5 - 34 U/L Final  . ALT 05/12/2017 10  0 - 55 U/L Final  . Total Protein 05/12/2017 7.3  6.4 - 8.3 g/dL Final  . Albumin 05/12/2017 3.3* 3.5 - 5.0 g/dL Final  . Calcium 05/12/2017 9.3  8.4 - 10.4 mg/dL Final  . Anion Gap 05/12/2017 10  3 - 11 mEq/L Final  . EGFR 05/12/2017 >60  >60 ml/min/1.73 m2 Final   eGFR is calculated using the CKD-EPI Creatinine Equation (2009)    (this displays the last labs from the last 3 days)  No results found for: TOTALPROTELP, ALBUMINELP, A1GS, A2GS, BETS, BETA2SER, GAMS, MSPIKE, SPEI (this displays SPEP labs)  No results found for: KPAFRELGTCHN, LAMBDASER, KAPLAMBRATIO (kappa/lambda light chains)  No results found for: HGBA, HGBA2QUANT, HGBFQUANT, HGBSQUAN (Hemoglobinopathy evaluation)   No results found for: LDH  No results found for: IRON, TIBC, IRONPCTSAT (Iron and TIBC)  No results found for: FERRITIN  Urinalysis No results found for: COLORURINE,  APPEARANCEUR, LABSPEC, PHURINE, GLUCOSEU, HGBUR, BILIRUBINUR, KETONESUR, PROTEINUR, UROBILINOGEN, NITRITE, LEUKOCYTESUR   STUDIES: Staging studies have been ordered including a CT of the chest and a bone scan as well as a breast MRI.  ELIGIBLE FOR AVAILABLE RESEARCH PROTOCOL:no  ASSESSMENT: 71 y.o. [place] woman (primarily residing in [place])  (1) status post left lumpectomy in 2012 for a reported pT2 pN1 breast cancer, the patient refusing adjuvant treatment (no chemotherapy, radiation, or antiestrogens).  (2) status post left breast biopsy 03/30/2017 for a clinical T4 N0, stage IIIB invasive ductal carcinoma, grade 3, triple positive, with an MIB-1 of 70%  (3) neoadjuvant chemotherapy with abraxane, trastuzumab/pertuzumab  (4) definitive surgery to follow  (5) adjuvant radiation as appropriate  (6) anti estrogens  (7) genetics testing  PLAN: We spent the better part of today's hour-long appointment discussing the biology of her diagnosis and the specifics of her situation.  We first reviewed the fact that cancer is not one disease but more than 100 different diseases and that it is important to keep them separate-- otherwise when friends and relatives discuss their own cancer experiences with Tracy Howell confusion can result. Similarly we explained that if breast cancer spreads to the bone or liver, the patient would not have bone cancer or liver cancer, but breast cancer in the bone and breast cancer in the  liver: one cancer in three places-- not 3 different cancers which otherwise would have to be treated in 3 different ways.  We discussed the difference between local and systemic therapy. In terms of loco-regional treatment, lumpectomy plus radiation is equivalent to mastectomy as far as survival is concerned.  At this point however there would be no alternative to mastectomy.  However if she receives systemic therapy before surgery there is a possibility she might be able to keep  her breast.  We also noted that in terms of sequencing of treatments, whether systemic therapy or surgery is done first does not affect the ultimate outcome.  We then discussed the rationale for systemic therapy. There is some risk that this cancer may have already spread to other parts of her body.  I quoted her a risk between 60 and 70% of microscopic spread already being present.  Unfortunately she may already have stage IV disease, although she is not symptomatic.  We are going to be obtaining CT scan of the chest and a bone scan and she understands that if they are positive we will have to have a different discussion as today's discussion presumes that she has stage III not stage IV disease.  Next we went over the options for systemic therapy which are anti-estrogens, anti-HER-2 immunotherapy, and chemotherapy.  Tracy Howell meets criteria for all 3, and she understands that chemotherapy will reduce her risk by one third, anti-HER-2 treatment by one half, and antiestrogens again by one half, so if we start for example with 60% risk of metastatic recurrence she would end up with a 10% risk with standard therapy, which is our recommendation.  We discussed the possible toxicity side effects and complications of chemotherapy, which in her case will consist of paclitaxel, trastuzumab and Pertuzumab.  Depending on the surgical results additional adjuvant chemotherapy can be considered.  Tracy Howell also met with the surgeon and radiation physicians today.  At this point she appears comfortable proceeding with treatment as planned.  Specifically she will have staging studies including a breast MRI, she will have a port, she will have an echocardiogram, she will have genetics testing, and she will return to see me within 2 weeks to discuss those results and to start her chemotherapy the following week.  Tracy Howell has a good understanding of the Howell plan. She agrees with it. She knows the goal of treatment in her  case is cure. She will call with any problems that may develop before her next visit here.  Tracy Cruel, MD   05/12/2017 4:50 PM Medical Oncology and Hematology [facility] [address] [place], [place] [address] Tel. [phone]    Fax. [phone]

## 2017-05-12 NOTE — Patient Instructions (Signed)

## 2017-05-12 NOTE — Progress Notes (Signed)
START OFF PATHWAY REGIMEN - Breast   [id number]:Paclitaxel + Trastuzumab:   A cycle is every 28 days:     Paclitaxel      Trastuzumab      Trastuzumab   **Always confirm dose/schedule in your pharmacy ordering system**    Patient Characteristics: Preoperative or Nonsurgical Candidate (Clinical Staging), Neoadjuvant Therapy followed by Surgery, Invasive Disease, Chemotherapy, HER2 Positive, ER Positive Therapeutic Status: Preoperative or Nonsurgical Candidate (Clinical Staging) AJCC M Category: cM0 AJCC Grade: G3 Breast Surgical Plan: Neoadjuvant Therapy followed by Surgery ER Status: Positive (+) AJCC 8 Stage Grouping: IIIB HER2 Status: Positive (+) AJCC T Category: cT4 AJCC N Category: cN0 PR Status: Positive (+) Intent of Therapy: Curative Intent, Discussed with Patient 

## 2017-05-12 NOTE — Progress Notes (Signed)
Radiation Oncology         [phone] ________________________________  Initial outpatient Consultation  Name: Tracy [facility]ell MRN: [id number]  Date: 05/12/2017  DOB: August 23, 1945  FK:CLEXN, Myra Rude, MD  Alphonsa Overall, MD   REFERRING PHYSICIAN: Alphonsa Overall, MD  DIAGNOSIS:    ICD-10-CM   1. Malignant neoplasm of upper-inner quadrant of left breast in female, estrogen receptor positive ([place]) C50.212    Z17.0    Cancer Staging Malignant neoplasm of upper-inner quadrant of left breast in female, estrogen receptor positive ([place]) Staging form: Breast, AJCC 8th Edition - Clinical stage from 05/12/2017: Stage IIIB (cT4b, cN0, cM0, G3, ER: Positive, PR: Positive, HER2: Positive) - Signed by Tracy Gibson, MD on 05/12/2017 Staging comments: Staged at breast conference on 11.28.18   CHIEF COMPLAINT: Here to discuss management of left breast cancer, recurrent  Tracy Howell is a 71 y.o. female who presented with left breast cancer, ER+, 2.5cm, Gr2, 1/5LN+, in 2012.  This was treated at an outside center with surgery alone, no adjuvant therapy.  6 mo ago she appreciated a recurrent mass under the lumpectomy scar in the left UIQ. [place] measures this to be 5cm, and axilla is currently clear.  She underwent biopsy of the mass showing Gr 3 IDC which is triple positive.    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  Her home is in [place].  She reports glasses for vision, SOB with walking, breast dimpling and lump/pain in breast.  PREVIOUS RADIATION THERAPY: No  PAST MEDICAL HISTORY:  has a past medical history of Angioedema, Breast cancer ([place]) (2013), Cancer ([place]), and SBO (small bowel obstruction) ([place]) (08/13/2014).    PAST SURGICAL HISTORY: Past Surgical History:  Procedure Laterality Date  . ABDOMINAL HYSTERECTOMY    . APPENDECTOMY    . BREAST LUMPECTOMY  2013  . BREAST SURGERY    . CESAREAN SECTION     x2  . CHOLECYSTECTOMY        FAMILY HISTORY: family history includes Angioedema in her mother; Breast cancer in her maternal aunt and mother.  SOCIAL HISTORY:  reports that  has never smoked. she has never used smokeless tobacco. She reports that she does not drink alcohol or use drugs.  ALLERGIES: Codeine; Morphine and related; Other; and Penicillins  MEDICATIONS:  Current Outpatient Medications  Medication Sig Dispense Refill  . Ascorbic Acid (VITAMIN C ER PO) Take 1 tablet by mouth daily.    . Multiple Vitamins-Minerals (MULTIVITAMIN ADULT PO) Take 1 tablet by mouth daily.    . naproxen sodium (ALEVE) 220 MG tablet Take 220 mg by mouth as needed.    Marland Kitchen VITAMIN B COMPLEX-C PO Take 1 tablet by mouth daily.     No current facility-administered medications for this encounter.     REVIEW OF SYSTEMS: A 10+ POINT REVIEW OF SYSTEMS WAS OBTAINED including neurology, dermatology, psychiatry, cardiac, respiratory, lymph, extremities, GI, GU, Musculoskeletal, constitutional, breasts, reproductive, HEENT.  All pertinent positives are noted in the HPI.  All others are negative.   PHYSICAL EXAM:    Vitals - 1 value per visit 17/00/1749  SYSTOLIC 449  DIASTOLIC 77  Pulse 675  Temperature 97.6  Respirations 18  Weight (lb) 250.4  Height 5' 7.5"  BMI 38.64  VISIT REPORT    General: Alert and oriented, in no acute distress HEENT: Head is normocephalic. Extraocular movements are intact. Oropharynx is clear. Neck:+possible supraclavicular lymphadenopathy: 1-1.5cm mass above left clavicle  Heart:  Regular in rate and rhythm with no murmurs, rubs, or gallops. Chest: Clear to auscultation bilaterally, with no rhonchi, wheezes, or rales. Abdomen: Soft, nontender, nondistended, with no rigidity or guarding. Extremities: +pitting LE edema. Lymphatics: see Neck Exam Skin: No concerning lesions. Musculoskeletal: symmetric strength and muscle tone throughout. Neurologic: Cranial nerves II through XII are grossly intact. No  obvious focalities. Speech is fluent. Coordination is intact. Psychiatric: Judgment and insight are intact. Affect is appropriate. Breasts: hard left breast in all quadrants.  Mass appears to originate from UIQ under lumpectomy scar.  The appears to be skin involvement.  1.5cm axillary node appreciated . No other palpable masses appreciated in the breasts or axillae bilaterally .   ECOG = 1  0 - Asymptomatic (Fully active, able to carry on all predisease activities without restriction)  1 - Symptomatic but completely ambulatory (Restricted in physically strenuous activity but ambulatory and able to carry out work of a light or sedentary nature. For example, light housework, office work)  2 - Symptomatic, <50% in bed during the day (Ambulatory and capable of all self care but unable to carry out any work activities. Up and about more than 50% of waking hours)  3 - Symptomatic, >50% in bed, but not bedbound (Capable of only limited self-care, confined to bed or chair 50% or more of waking hours)  4 - Bedbound (Completely disabled. Cannot carry on any self-care. Totally confined to bed or chair)  5 - Death   Eustace Pen MM, Creech RH, Tormey DC, et al. [phone]). "Toxicity and response criteria of the [facility]". [place] Oncol. 5 (6): 649-55   LABORATORY DATA:  Lab Results  Component Value Date   WBC 4.7 05/12/2017   HGB 13.1 05/12/2017   HCT 39.6 05/12/2017   MCV 91.1 05/12/2017   PLT 270 05/12/2017   CMP     Component Value Date/Time   NA 142 05/12/2017 0808   K 3.5 05/12/2017 0808   CL 108 08/14/2014 0531   CO2 26 05/12/2017 0808   GLUCOSE 133 05/12/2017 0808   BUN 9.8 05/12/2017 0808   CREATININE 0.9 05/12/2017 0808   CALCIUM 9.3 05/12/2017 0808   PROT 7.3 05/12/2017 0808   ALBUMIN 3.3 (L) 05/12/2017 0808   AST 15 05/12/2017 0808   ALT 10 05/12/2017 0808   ALKPHOS 90 05/12/2017 0808   BILITOT 0.56 05/12/2017 0808   GFRNONAA 83 (L) 08/14/2014 0531     GFRAA >90 08/14/2014 0531         RADIOGRAPHY: as above    IMPRESSION/PLAN: locally advanced recurrent L breast cancer  Staging scans, (MRI breast, bone scan, CT of body) pending.  Referral for genetics; Tentative treatment plan: neoadjuvant chemotherapy followed by surgery (likely mastectomy) and then radiotherapy.    It was a pleasure meeting the patient today. We discussed the risks, benefits, and side effects of radiotherapy. I recommend radiotherapy to the left breast or chest wall and regional nodes to reduce her risk of locoregional recurrence by 2/3.  We discussed that radiation would take approximately 6 weeks to complete and that I would give the patient a few weeks to heal following surgery before starting treatment planning.  We spoke about acute effects including skin irritation and fatigue as well as much less common late effects including internal organ injury or irritation. We spoke about the latest technology that is used to minimize the risk of late effects for patients undergoing radiotherapy to the breast or chest wall.  No guarantees of treatment were given. The patient is enthusiastic about proceeding with treatment. I look forward to participating in the patient's care.  I will await her referral back to me for postoperative follow-up and eventual CT simulation/treatment planning.  She lives part time here and more so in [place].  She desires treatment here.  I discussed the critical nature of creating a new home-base here in [place] if she receives her oncologic treatments here.  Travel to and from [place] could compromise her care and outcome. She and her family understand this.    __________________________________________   Tracy Gibson, MD

## 2017-05-12 NOTE — Progress Notes (Signed)
Clinical Social Work [place] Psychosocial Distress Screening [place]  Patient completed distress screening protocol and scored a 2 on the Psychosocial Distress Thermometer which indicates mild distress. Clinical Social Worker met with patient and patients family in [facility] to assess for distress and other psychosocial needs. Patient stated she was feeling overwhelmed but felt "better" after meeting with the treatment team and getting more information on her treatment plan. CSW and patient discussed common feeling and emotions when being diagnosed with cancer, and the importance of support during treatment. CSW informed patient of the support team and support services at [facility], and patient was agreeable to an [employer] referral. CSW provided contact information and encouraged patient to call with any questions or concerns.  ONCBCN DISTRESS SCREENING 05/12/2017  Screening Type Initial Screening  Distress experienced in past week (1-10) 2  Emotional problem type Adjusting to illness  Spiritual/Religous concerns type Relating to God  Physical Problem type Pain  Physician notified of physical symptoms Yes  Referral to support programs Yes     Johnnye Lana, MSW, LCSW, OSW-C Clinical Social Worker [place] [phone]

## 2017-05-13 ENCOUNTER — Telehealth: Payer: Self-pay | Admitting: Oncology

## 2017-05-13 ENCOUNTER — Other Ambulatory Visit: Payer: Medicare Other

## 2017-05-13 NOTE — Telephone Encounter (Signed)
Scheduled appt per 11/28 los - patient to get a new schedule next visit.

## 2017-05-14 ENCOUNTER — Telehealth: Payer: Self-pay | Admitting: *Deleted

## 2017-05-14 ENCOUNTER — Other Ambulatory Visit: Payer: Self-pay

## 2017-05-14 ENCOUNTER — Encounter (HOSPITAL_COMMUNITY): Payer: Self-pay | Admitting: *Deleted

## 2017-05-14 NOTE — Telephone Encounter (Signed)
This RN received VM from [place] at Dr Pollie Friar stating pt has been scheduled for PORT placement 12/3 which may interfere with appointments for ECHO and chemo EDU.  Debbie left her return call number of [phone] for return call to confirm need to reschedule other appointments per surgical need for PORT.  This RN returned call and obtained identified VM- message left stating agreement to proceeding to PORT placement. Other appointments will be rescheduled and pt will be contacted by this RN.  This RN then called and spoke with pt - she verified she is scheduled for port at 715 AM 12/3.  Per phone discussion pt would like to reschedule other appointments for same day.  Pt appreciated call clarifying above " because after surgeries I just have a hard time waking up ".  LOS sent per above -

## 2017-05-16 ENCOUNTER — Encounter (HOSPITAL_COMMUNITY): Payer: Self-pay | Admitting: Anesthesiology

## 2017-05-16 NOTE — Anesthesia Preprocedure Evaluation (Addendum)
Anesthesia Evaluation  Patient identified by MRN, date of birth, ID band Patient awake    Reviewed: Allergy & Precautions, NPO status , Patient's Chart, lab work & pertinent test results  Airway Mallampati: I  TM Distance: >3 FB Neck ROM: Full    Dental  (+) Teeth Intact, Dental Advisory Given   Pulmonary neg pulmonary ROS,    breath sounds clear to auscultation       Cardiovascular negative cardio ROS   Rhythm:Regular Rate:Normal     Neuro/Psych negative neurological ROS     GI/Hepatic negative GI ROS, Neg liver ROS,   Endo/Other  negative endocrine ROS  Renal/GU negative Renal ROS     Musculoskeletal negative musculoskeletal ROS (+)   Abdominal   Peds  Hematology negative hematology ROS (+)   Anesthesia Other Findings Day of surgery medications reviewed with the patient.  Reproductive/Obstetrics                            Anesthesia Physical Anesthesia Plan  ASA: III  Anesthesia Plan: General   Post-op Pain Management:    Induction: Intravenous  PONV Risk Score and Plan: 4 or greater and Ondansetron, Dexamethasone and Midazolam  Airway Management Planned: LMA  Additional Equipment:   Intra-op Plan:   Post-operative Plan: Extubation in OR  Informed Consent: I have reviewed the patients History and Physical, chart, labs and discussed the procedure including the risks, benefits and alternatives for the proposed anesthesia with the patient or authorized representative who has indicated his/her understanding and acceptance.     Plan Discussed with:   Anesthesia Plan Comments:        Anesthesia Quick Evaluation

## 2017-05-16 NOTE — H&P (Signed)
Tracy Howell  Location: [facility] Patient #: [id number] DOB: 07-Oct-1945 Married / Language: English / Race: Black or African American Female  History of Present Illness   The patient is a 71 year old female who presents with a complaint of breast cancer.  The PCP is Tracy Howell  The patient was referred by Tracy Howell  The pateint is at the [facility] is [facility]  She has her husband and friend, Dr. Osa Howell.  She splits her time between [place] and [place], [place]. 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  She underwent a left breast lumpectomy in [place] ([place]) in 2012 for a 2.5 cm left breast cancer. She said that they took out 2 lymph nodes, both were negative. (Though at one time, we heard that she had 1/5 nodes positive). She thinks her tumor in 2012 was ER positive, PR positive, HER-2/neu positive. she took no additional treatment. She has delayed coming to see Tracy Howell at the Breast [facility] already. She is worried about having an open wound to manage.  Mammograms: [facility], 5 cm mass left breast mass on Tracy Howell Biopsy: left breast on 03/30/2017, ([id number]), invasive ductal cancer, Grade 3, ER - positive, PR - positive, Her2Neu - positive Family history of breast or ovarian cancer: She was adopted, but her birth mother did have breast cancer. On hormone therapy: None.  I discussed the options for breast cancer treatment with the patient. The patient is at the [facility], which includes medical oncology and radiation oncology. I discussed the surgical options of lumpectomy vs. mastectomy. If mastectomy, there is the possibility of reconstruction. I discussed the options of lymph node biopsy. The treatment plan depends on the pathologic staging of the tumor and the patient's personal  wishes. The risks of surgery include, but are not limited to, bleeding, infection, the need for further surgery, and nerve injury. The patient has been given literature on the treatment of breast cancer.  I discussed the indications and potential complications of the power port placement. The primary complications of the power port, include, but are not limited to, bleeding, infection, nerve injury, thrombosis, and pneumothorax.  Plan: 1) Staging studies, 2) MRI Of breast, 3) Genetics, 4) Port placement, 5) will almost certainly need a left mastectomy, will manange left axilla depending on MRI and final path.  Past Medical History: 1. Hysterectomy - [facility] For benign disease 2. [facility]olecystectomy - [facility] - 2010 3. History of appendectomy 4. Colonoscopy by Tracy Howell around 2008.  Social History: She has her husband, Tracy Howell. friend, Dr. Osa Howell (former quality officer for [employer]) 2 sons - Tracy Howell (04) trucker in [place], [place], and Tracy Howell [phone]) in [place]. 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   Medication History Tawni Pummel, RN; 05/12/2017 7:44 AM) Medications Reconciled    Physical Exam  General: WN obese AA F alert and generally healthy appearing. Skin: Inspection and palpation of the skin unremarkable.  Eyes: Conjunctivae white, pupils equal. Face, ears, nose, mouth, and throat: Face - normal. Normal ears and nose. Lips and teeth normal.  Neck: Supple. No mass. Trachea midline. No thyroid mass.  Lymph Nodes: No supraclavicular or cervical adenopathy. No axillary adenopathy.  Lungs: Normal respiratory effort. Clear to auscultation and symmetric breath sounds. Cardiovascular: Regular rate and rythm. Normal auscultation of the heart. No murmur or rub.  Breasts: Right: No mass or nodule Left - 10+ cm mass invoving entire left breast. She has pigmentation of the skin.  She has nodules in the skin in the upper breast, the furtherest is at 12 o'clock about 10 cm from the nipple. The left breast is contracted and essentially replaced with cancer. [Dr. Jana Howell has a picture in Epic]  Left breast  Abdomen: Soft. No mass. Liver and spleen not palpable. No tenderness. No hernia. Normal bowel sounds.  She has a 2.0 cm mass midway between the xiphoid and umbilicus - feels like a sebaceous cyst. Rectal: Not done.  Musculoskeletal/extremities: Normal gait. Good strength and ROM in upper and lower extremities.   Neurologic: Grossly intact to motor and sensory function.   Psychiatric: Has normal mood and affect. Judgement and insight appear normal.   Assessment & Plan  1.  BREAST CANCER, STAGE 2, LEFT (C50.912)  Story: Biopsy: left breast on 03/30/2017, ([id number]), invasive ductal cancer, Grade 3, ER - positive, PR - positive, Her2Neu - positive  [facility]  Plan:   1) Staging studies,   2) MRI Of breast,   3) Genetics,   4) Port placement,   5) will almost certainly need a left mastectomy, will manange left axilla depending on MRI and final path.  2.  HISTORY OF LEFT BREAST CANCER (Z85.3)  Impression: She says that she had a left breast cancer treated with lumpectomy (2.5 cm), 0/2 nodes, ER/PR/Her2Neu positive. She had no further tx.  We'll try to get the records from [place].  3.  SEBACEOUS CYST (L72.3)  Impression: Mid upper abdomen, 2.0 cm   Tracy Overall, MD, [facility] Pager: [phone] Office phone:  [phone]

## 2017-05-17 ENCOUNTER — Ambulatory Visit (HOSPITAL_COMMUNITY): Payer: Medicare Other | Admitting: Anesthesiology

## 2017-05-17 ENCOUNTER — Ambulatory Visit (HOSPITAL_COMMUNITY): Payer: Medicare Other

## 2017-05-17 ENCOUNTER — Encounter (HOSPITAL_COMMUNITY)
Admission: RE | Disposition: A | Discharge status: Home / Self Care | Payer: Self-pay | Source: Ambulatory Visit | Attending: Surgery

## 2017-05-17 ENCOUNTER — Other Ambulatory Visit: Payer: Medicare Other

## 2017-05-17 ENCOUNTER — Inpatient Hospital Stay (HOSPITAL_COMMUNITY): Admission: RE | Admit: 2017-05-17 | Payer: Medicare Other | Source: Ambulatory Visit

## 2017-05-17 ENCOUNTER — Encounter (HOSPITAL_COMMUNITY): Payer: Self-pay

## 2017-05-17 ENCOUNTER — Ambulatory Visit (HOSPITAL_COMMUNITY)
Admission: RE | Admit: 2017-05-17 | Discharge: 2017-05-17 | Disposition: A | Discharge status: Home / Self Care | Payer: Medicare Other | Source: Ambulatory Visit | Attending: Surgery | Admitting: Surgery

## 2017-05-17 DIAGNOSIS — Z91018 Allergy to other foods: Secondary | ICD-10-CM | POA: Insufficient documentation

## 2017-05-17 DIAGNOSIS — Z8041 Family history of malignant neoplasm of ovary: Secondary | ICD-10-CM | POA: Diagnosis not present

## 2017-05-17 DIAGNOSIS — Z170 Estrogen receptor positive status [ER+]: Secondary | ICD-10-CM | POA: Diagnosis not present

## 2017-05-17 DIAGNOSIS — Z888 Allergy status to other drugs, medicaments and biological substances status: Secondary | ICD-10-CM | POA: Diagnosis not present

## 2017-05-17 DIAGNOSIS — L723 Sebaceous cyst: Secondary | ICD-10-CM | POA: Insufficient documentation

## 2017-05-17 DIAGNOSIS — Z803 Family history of malignant neoplasm of breast: Secondary | ICD-10-CM | POA: Insufficient documentation

## 2017-05-17 DIAGNOSIS — C50212 Malignant neoplasm of upper-inner quadrant of left female breast: Secondary | ICD-10-CM | POA: Diagnosis not present

## 2017-05-17 DIAGNOSIS — C50812 Malignant neoplasm of overlapping sites of left female breast: Secondary | ICD-10-CM | POA: Insufficient documentation

## 2017-05-17 DIAGNOSIS — Z452 Encounter for adjustment and management of vascular access device: Secondary | ICD-10-CM | POA: Diagnosis not present

## 2017-05-17 DIAGNOSIS — K56609 Unspecified intestinal obstruction, unspecified as to partial versus complete obstruction: Secondary | ICD-10-CM | POA: Diagnosis not present

## 2017-05-17 DIAGNOSIS — Z95828 Presence of other vascular implants and grafts: Secondary | ICD-10-CM

## 2017-05-17 DIAGNOSIS — Z885 Allergy status to narcotic agent status: Secondary | ICD-10-CM | POA: Diagnosis not present

## 2017-05-17 DIAGNOSIS — Z880 Allergy status to penicillin: Secondary | ICD-10-CM | POA: Insufficient documentation

## 2017-05-17 DIAGNOSIS — J9811 Atelectasis: Secondary | ICD-10-CM | POA: Diagnosis not present

## 2017-05-17 DIAGNOSIS — C50912 Malignant neoplasm of unspecified site of left female breast: Secondary | ICD-10-CM | POA: Diagnosis not present

## 2017-05-17 HISTORY — DX: Pneumonia, unspecified organism: J18.9

## 2017-05-17 HISTORY — PX: PORTACATH PLACEMENT: SHX2246

## 2017-05-17 HISTORY — DX: Adverse effect of unspecified anesthetic, initial encounter: T41.45XA

## 2017-05-17 HISTORY — DX: Other complications of anesthesia, initial encounter: T88.59XA

## 2017-05-17 SURGERY — INSERTION, TUNNELED CENTRAL VENOUS DEVICE, WITH PORT
Anesthesia: General | Laterality: Right

## 2017-05-17 MED ORDER — BUPIVACAINE-EPINEPHRINE (PF) 0.25% -1:200000 IJ SOLN
INTRAMUSCULAR | Status: DC | PRN
  Administered 2017-05-17: 10 mL

## 2017-05-17 MED ORDER — HYDROCODONE-ACETAMINOPHEN 5-325 MG PO TABS: 1.0000 | ORAL_TABLET | Freq: Four times a day (QID) | ORAL | 0 refills | Status: DC | PRN

## 2017-05-17 MED ORDER — LIDOCAINE HCL (PF) 1 % IJ SOLN
INTRAMUSCULAR | Status: AC
  Filled 2017-05-17: qty 30

## 2017-05-17 MED ORDER — SODIUM CHLORIDE 0.9 % IV SOLN
Freq: Once | INTRAVENOUS | Status: AC
  Administered 2017-05-17: 500 mL
  Filled 2017-05-17: qty 1.2

## 2017-05-17 MED ORDER — PHENYLEPHRINE HCL 10 MG/ML IJ SOLN
INTRAMUSCULAR | Status: DC | PRN
  Administered 2017-05-17: 40 ug via INTRAVENOUS
  Administered 2017-05-17: 80 ug via INTRAVENOUS

## 2017-05-17 MED ORDER — HEPARIN SOD (PORK) LOCK FLUSH 100 UNIT/ML IV SOLN
INTRAVENOUS | Status: DC | PRN
  Administered 2017-05-17: 400 [IU]

## 2017-05-17 MED ORDER — DEXAMETHASONE SODIUM PHOSPHATE 10 MG/ML IJ SOLN
INTRAMUSCULAR | Status: DC | PRN
  Administered 2017-05-17: 10 mg via INTRAVENOUS

## 2017-05-17 MED ORDER — ACETAMINOPHEN 500 MG PO TABS
1000.0000 mg | ORAL_TABLET | ORAL | Status: AC
  Administered 2017-05-17: 1000 mg via ORAL
  Filled 2017-05-17: qty 2

## 2017-05-17 MED ORDER — PROPOFOL 10 MG/ML IV BOLUS
INTRAVENOUS | Status: AC
  Filled 2017-05-17: qty 20

## 2017-05-17 MED ORDER — 0.9 % SODIUM CHLORIDE (POUR BTL) OPTIME
TOPICAL | Status: DC | PRN
  Administered 2017-05-17: 1000 mL

## 2017-05-17 MED ORDER — CHLORHEXIDINE GLUCONATE CLOTH 2 % EX PADS: 6.0000 | MEDICATED_PAD | Freq: Once | CUTANEOUS | Status: DC

## 2017-05-17 MED ORDER — LIDOCAINE 2% (20 MG/ML) 5 ML SYRINGE
INTRAMUSCULAR | Status: AC
  Filled 2017-05-17: qty 5

## 2017-05-17 MED ORDER — BUPIVACAINE-EPINEPHRINE 0.25% -1:200000 IJ SOLN
INTRAMUSCULAR | Status: AC
  Filled 2017-05-17: qty 1

## 2017-05-17 MED ORDER — CEFAZOLIN SODIUM-DEXTROSE 2-4 GM/100ML-% IV SOLN
2.0000 g | INTRAVENOUS | Status: AC
  Administered 2017-05-17: 2 g via INTRAVENOUS

## 2017-05-17 MED ORDER — LACTATED RINGERS IV SOLN
INTRAVENOUS | Status: DC
  Administered 2017-05-17: 06:00:00 via INTRAVENOUS

## 2017-05-17 MED ORDER — PROMETHAZINE HCL 25 MG/ML IJ SOLN: 6.2500 mg | INTRAMUSCULAR | Status: DC | PRN

## 2017-05-17 MED ORDER — FENTANYL CITRATE (PF) 100 MCG/2ML IJ SOLN
INTRAMUSCULAR | Status: AC
Start: 2017-05-17 — End: ?
  Filled 2017-05-17: qty 2

## 2017-05-17 MED ORDER — ONDANSETRON HCL 4 MG/2ML IJ SOLN
INTRAMUSCULAR | Status: AC
  Filled 2017-05-17: qty 2

## 2017-05-17 MED ORDER — ONDANSETRON HCL 4 MG/2ML IJ SOLN
INTRAMUSCULAR | Status: DC | PRN
  Administered 2017-05-17: 4 mg via INTRAVENOUS

## 2017-05-17 MED ORDER — LIDOCAINE HCL (CARDIAC) 20 MG/ML IV SOLN
INTRAVENOUS | Status: DC | PRN
  Administered 2017-05-17: 100 mg via INTRAVENOUS

## 2017-05-17 MED ORDER — FENTANYL CITRATE (PF) 100 MCG/2ML IJ SOLN
25.0000 ug | INTRAMUSCULAR | Status: DC | PRN
  Administered 2017-05-17 (×2): 50 ug via INTRAVENOUS

## 2017-05-17 MED ORDER — MEPERIDINE HCL 50 MG/ML IJ SOLN: 6.2500 mg | INTRAMUSCULAR | Status: DC | PRN

## 2017-05-17 MED ORDER — HEPARIN SOD (PORK) LOCK FLUSH 100 UNIT/ML IV SOLN
INTRAVENOUS | Status: AC
  Filled 2017-05-17: qty 5

## 2017-05-17 MED ORDER — CEFAZOLIN SODIUM-DEXTROSE 2-4 GM/100ML-% IV SOLN
INTRAVENOUS | Status: AC
  Filled 2017-05-17: qty 100

## 2017-05-17 MED ORDER — PROPOFOL 10 MG/ML IV BOLUS
INTRAVENOUS | Status: DC | PRN
  Administered 2017-05-17: 150 mg via INTRAVENOUS

## 2017-05-17 MED ORDER — DEXAMETHASONE SODIUM PHOSPHATE 10 MG/ML IJ SOLN
INTRAMUSCULAR | Status: AC
  Filled 2017-05-17: qty 1

## 2017-05-17 MED ORDER — GABAPENTIN 300 MG PO CAPS
300.0000 mg | ORAL_CAPSULE | ORAL | Status: AC
  Administered 2017-05-17: 300 mg via ORAL
  Filled 2017-05-17: qty 1

## 2017-05-17 SURGICAL SUPPLY — 32 items
ADH SKN CLS APL DERMABOND .7 (GAUZE/BANDAGES/DRESSINGS) ×1
APL SKNCLS STERI-STRIP NONHPOA (GAUZE/BANDAGES/DRESSINGS)
BAG DECANTER FOR FLEXI CONT (MISCELLANEOUS) ×2 IMPLANT
BENZOIN TINCTURE PRP APPL 2/3 (GAUZE/BANDAGES/DRESSINGS) IMPLANT
BLADE SURG 15 STRL LF DISP TIS (BLADE) ×1 IMPLANT
BLADE SURG 15 STRL SS (BLADE) ×2
CHLORAPREP W/TINT 26ML (MISCELLANEOUS) ×2 IMPLANT
COVER PROBE U/S 5X48 (MISCELLANEOUS) IMPLANT
COVER SURGICAL LIGHT HANDLE (MISCELLANEOUS) ×2 IMPLANT
DECANTER SPIKE VIAL GLASS SM (MISCELLANEOUS) ×2 IMPLANT
DERMABOND ADVANCED (GAUZE/BANDAGES/DRESSINGS) ×1
DERMABOND ADVANCED .7 DNX12 (GAUZE/BANDAGES/DRESSINGS) IMPLANT
DRAPE C-ARM 42X120 X-RAY (DRAPES) ×2 IMPLANT
DRAPE LAPAROSCOPIC ABDOMINAL (DRAPES) ×2 IMPLANT
ELECT PENCIL ROCKER SW 15FT (MISCELLANEOUS) ×2 IMPLANT
ELECT REM PT RETURN 15FT ADLT (MISCELLANEOUS) ×2 IMPLANT
GAUZE SPONGE 2X2 8PLY STRL LF (GAUZE/BANDAGES/DRESSINGS) IMPLANT
GAUZE SPONGE 4X4 16PLY XRAY LF (GAUZE/BANDAGES/DRESSINGS) ×2 IMPLANT
GLOVE SURG SIGNA 7.5 PF LTX (GLOVE) ×2 IMPLANT
GOWN STRL REUS W/TWL XL LVL3 (GOWN DISPOSABLE) ×4 IMPLANT
KIT BASIN OR (CUSTOM PROCEDURE TRAY) ×2 IMPLANT
KIT PORT POWER 8FR ISP CVUE (Miscellaneous) ×1 IMPLANT
NDL HYPO 25X1 1.5 SAFETY (NEEDLE) ×1 IMPLANT
NEEDLE HYPO 25X1 1.5 SAFETY (NEEDLE) ×2 IMPLANT
PACK BASIC VI WITH GOWN DISP (CUSTOM PROCEDURE TRAY) ×2 IMPLANT
SPONGE GAUZE 2X2 STER 10/PKG (GAUZE/BANDAGES/DRESSINGS)
SUT MNCRL AB 4-0 PS2 18 (SUTURE) ×2 IMPLANT
SUT VIC AB 3-0 SH 18 (SUTURE) ×2 IMPLANT
SYR 10ML ECCENTRIC (SYRINGE) ×2 IMPLANT
SYR CONTROL 10ML LL (SYRINGE) ×2 IMPLANT
TOWEL OR 17X26 10 PK STRL BLUE (TOWEL DISPOSABLE) ×2 IMPLANT
TOWEL OR NON WOVEN STRL DISP B (DISPOSABLE) ×2 IMPLANT

## 2017-05-17 NOTE — Interval H&P Note (Signed)
History and Physical Interval Note:  05/17/2017 7:28 AM  Tracy Howell  has presented today for surgery, with the diagnosis of Left breast cancer  The various methods of treatment have been discussed with the patient and family.  Her husband is here with her.  After consideration of risks, benefits and other options for treatment, the patient has consented to  Procedure(s): [place] (N/A) as a surgical intervention .  The patient's history has been reviewed, patient examined, no change in status, stable for surgery.  I have reviewed the patient's chart and labs.  Questions were answered to the patient's satisfaction.     Shann Medal

## 2017-05-17 NOTE — Transfer of Care (Signed)
Immediate Anesthesia Transfer of Care Note  Patient: Tracy Howell  Procedure(s) Performed: INSERTION PORT-A-CATH right subclavian vein (Right )  Patient Location: PACU  Anesthesia Type:General  Level of Consciousness: drowsy, patient cooperative and responds to stimulation  Airway & Oxygen Therapy: Patient Spontanous Breathing  Post-op Assessment: Report given to RN, Post -op Vital signs reviewed and stable and Patient moving all extremities  Post vital signs: Reviewed and stable  Last Vitals:  Vitals:   05/17/17 0536  BP: (!) 171/80  Pulse: 83  Resp: 18  Temp: 36.6 C  SpO2: 94%    Last Pain:  Vitals:   05/17/17 0617  TempSrc:   PainSc: 0-No pain      Patients Stated Pain Goal: 3 (46/27/03 5009)  Complications: No apparent anesthesia complications

## 2017-05-17 NOTE — Anesthesia Postprocedure Evaluation (Signed)
Anesthesia Post Note  Patient: Cyara Devoto  Procedure(s) Performed: INSERTION PORT-A-CATH right subclavian vein (Right )     Patient location during evaluation: PACU Anesthesia Type: General Level of consciousness: awake and alert Pain management: pain level controlled Vital Signs Assessment: post-procedure vital signs reviewed and stable Respiratory status: spontaneous breathing, nonlabored ventilation, respiratory function stable and patient connected to nasal cannula oxygen Cardiovascular status: blood pressure returned to baseline and stable Postop Assessment: no apparent nausea or vomiting Anesthetic complications: no    Last Vitals:  Vitals:   05/17/17 0940 05/17/17 1015  BP: 132/66 (!) 142/70  Pulse: 77 80  Resp: 18 18  Temp: (!) 36.3 C (!) 36.3 C  SpO2: 92% 97%    Last Pain:  Vitals:   05/17/17 1015  TempSrc: Oral  PainSc:                  Effie Berkshire

## 2017-05-17 NOTE — Op Note (Signed)
05/17/2017  8:23 AM  PATIENT:  Tracy Howell, 71 y.o., female MRN: [id number] DOB: 07-08-1945  PREOP DIAGNOSIS:  Left breast cancer, anticipate chemotherapy  POSTOP DIAGNOSIS:   Left breast cancer, anticipate chemotherapy  PROCEDURE:   Procedure(s): INSERTION PORT-A-CATH right subclavian vein  SURGEON:   Alphonsa Overall, M.D.  ANESTHESIA:   general  CRNA: Lavina Hamman, CRNA; Cynda Familia, CRNA  General  EBL:  minimal  ml  COUNTS CORRECT:  YES  INDICATIONS FOR PROCEDURE:  Tracy Howell is a 71 y.o. (DOB: 02-Apr-1946) AA female whose primary care physician is Lucianne Lei, MD and comes for power port placement for the treatment of left breast cancer.  Dr. Jana Hakim is her treating oncologist.   The indications and risks of the surgery were explained to the patient.  The risks include, but are not limited to, infection, bleeding, pneumothorax, nerve injury, and thrombosis of the vein.  OPERATIVE NOTE:  The patient was taken to Room #4 at [facility].  Anesthesia was provided by CRNA: Lavina Hamman, CRNA; Cynda Familia, CRNA.  At the beginning of the operation, the patient was given 2 gm Ancef, had a roll placed under her back, and had the upper chest/neck prepped with Chloroprep and draped.   A time out was held and the surgery checklist reviewed.   The patient was placed in Trendelenburg position.  The right subclavian vein was accessed with a 16 gauge needle and a guide wire threaded through the needle into the vein.  The position of the wire was checked with fluoroscopy.   I then developed a pocket in the upper inner aspect of the right chest for the port reservoir.  I used the [employer] for venous access.  The reservoir was sewn in place with a 3-0 Vicryl suture.  The reservoir had been flushed with dilute (10 units/cc) heparin.   I then passed the silastic tubing from the reservoir incision to the subclavian stick site and used the  8 French introducer to pass it into the vein.  The tip of the silastic catheter was position at the junction of the SVC and the right atrium under fluoroscopy.  The silastic catheter was then attached to the port with the bayonet device.     The entire port and tubing were checked with fluoroscopy and then the port was flushed with 4 cc of concentrated heparin (100 units/cc).   The wounds were then closed with 3-0 vicryl subcutaneous sutures and the skin closed with a 4-0 Monocryl suture.  The skin was painted with DermaBond.   The patient was transferred to the recovery room in good condition.  The sponge and needle count were correct at the end of the case.  A CXR is ordered for port placement and pending at the time of this note.  Alphonsa Overall, MD, [facility] Pager: [phone] Office phone:  [phone]

## 2017-05-17 NOTE — Discharge Instructions (Signed)
[facility] - DISCHARGE INSTRUCTIONS TO PATIENT  Activity:  Driving - May drive tomorrow, if doing well and not taking pain meds   Lifting - No lifting more than 15 pounds for 5 days, then no limit  Wound Care:   Leave the incision dry for 2 days, then may shower  Diet:  As tolerated  Follow up appointment:  Call Dr. Pollie Friar office [facility]) at [phone] for an appointment in 2 months  Medications and dosages:  Resume your home medications.  You have a prescription for:  Vicodin  Call Dr. Lucia Gaskins or his office  [phone]) if you have:  Temperature greater than 100.4,  Persistent nausea and vomiting,  Severe uncontrolled pain,  Redness, tenderness, or signs of infection (pain, swelling, redness, odor or green/yellow discharge around the site),  Difficulty breathing, headache or visual disturbances,  Any other questions or concerns you may have after discharge.  In an emergency, call 911 or go to an Emergency Department at a nearby hospital.

## 2017-05-17 NOTE — Anesthesia Procedure Notes (Signed)
Procedure Name: LMA Insertion Date/Time: 05/17/2017 7:41 AM Performed by: Lavina Hamman, CRNA Pre-anesthesia Checklist: Patient identified, Emergency Drugs available, Suction available, Patient being monitored and Timeout performed Patient Re-evaluated:Patient Re-evaluated prior to induction Oxygen Delivery Method: Circle system utilized Preoxygenation: Pre-oxygenation with 100% oxygen Induction Type: IV induction LMA: LMA inserted LMA Size: 4.0 Number of attempts: 2 Tube secured with: Tape Dental Injury: Teeth and Oropharynx as per pre-operative assessment

## 2017-05-18 ENCOUNTER — Encounter (HOSPITAL_COMMUNITY): Payer: Self-pay | Admitting: Surgery

## 2017-05-18 ENCOUNTER — Encounter: Payer: Self-pay | Admitting: Oncology

## 2017-05-19 ENCOUNTER — Telehealth: Payer: Self-pay | Admitting: Oncology

## 2017-05-19 NOTE — Telephone Encounter (Signed)
Scheduled appt per 11/30 sch msg - left voicemail. Tried to coordinate with Friday appts however she has scans on that date.

## 2017-05-20 ENCOUNTER — Other Ambulatory Visit: Payer: Self-pay

## 2017-05-20 ENCOUNTER — Other Ambulatory Visit: Payer: Medicare Other

## 2017-05-20 DIAGNOSIS — C50912 Malignant neoplasm of unspecified site of left female breast: Secondary | ICD-10-CM

## 2017-05-20 NOTE — Progress Notes (Signed)
[place]  [phone] [phone]) [phone]     ID: Tracy Howell DOB: 1946-05-18  MR#: [id number]  HKV#:[id number]  Patient Care Team: Tracy Lei, MD as PCP - General (Family Medicine) Tracy Overall, MD as Consulting Physician (General Surgery) Tracy Howell, Tracy Dad, MD as Consulting Physician (Oncology) Tracy Gibson, MD as Attending Physician (Radiation Oncology) Tracy Craver, MD as Consulting Physician (Gastroenterology) OTHER MD:  CHIEF COMPLAINT: triple positive breast cancer  CURRENT TREATMENT: T-DM1   HISTORY OF CURRENT ILLNESS: From the original intake note:  "Tracy Howell" tells me she underwent left lumpectomy in [place] on 2012 for a 2.5 cm, grade 2 breast cancer involving one lymph node of 5 sampled. [ It may have been only 2 lymph nodes that were removed she says.]  This was at the [facility], currently the [facility] on [address].  The patient says she was told could have more treatment if she wanted it.  She did not see any sense in it.  She feels her left breast cancer was caused by the fact that she kept her iPhone in her bra right by the left breast.  Sometime in April 2018 she noted a new mass in the left breast. She did not immediately bring it to medical attention but as it continued to grow she mentioned her to her primary care physician and on 03/29/2017 Tracy Howell underwent bilateral diagnostic mammography with tomography and left breast ultrasonography at [facility].  The breast density was category B.  In the left breast central to the nipple there was a 6 cm irregular mass with indistinct margins and heterogeneous calcifications.  By ultrasound this measured 5 cm, with indistinct margins, at the 12:00 anterior area.  There was associated edema.  The left axilla was sonographically benign.  Biopsy of the left breast area in question March 30, 2017 showed ([id number]) invasive  ductal carcinoma, grade 3, estrogen receptor 100% positive, progesterone receptor 60% positive, both with strong staining intensity, HER-2 amplified, with a signals ratio of 3.38, and the number per cell 7.95.  The  MIB-1 was 70%.  The patient's subsequent history is as detailed below.  INTERVAL HISTORY: Tracy Howell returns today for further evaluation and treatment of her triple positive breast cancer. She is accompanied by her husband, Tracy Howell. Since last visit here she was staged with CT scans of the chest abdomen and pelvis.  Unfortunately these show multiple bilateral pulmonary nodules.  There are no bone or liver lesions.  Bone scan was also obtained today, with the final results still pending.  Since her last visit here she also had a port placed. She tolerated the procedure well.   She also had MRI of both breasts on 05/13/2017.  This found diffuse abnormal enhancement throughout the left breast with enlarged lymph nodes posterior to the left pectoralis minor and mildly enlarged left internal mammary lymph nodes.  On the right side there was a 7 cm area of non-masslike enhancement and a focally thickened right axillary lymph node.   REVIEW OF SYSTEMS: Tracy Howell is doing well. She does go for walks, but notes she is busy and doesn't exercise as much as she should. Even though she lives in [place], [place], she would like to continue receiving treatment here in [place]. Her and her husband feel more comfortable with the level of care here because of all of the people that they know in the medical field. She denies unusual headaches, visual changes, nausea, vomiting, or  dizziness. There has been no unusual cough, phlegm production, or pleurisy. This been no change in bowel or bladder habits. She denies unexplained fatigue or unexplained weight loss, bleeding, rash, or fever. A detailed review of systems was otherwise entirely stable.    PAST MEDICAL HISTORY: Past Medical History:  Diagnosis  Date  . Angioedema   . Breast cancer ([place]) 2013  . Complication of anesthesia    slow to wake up   . Pneumonia    hx of x 2   . recurrent left breast ca dx'd 04/2017  . SBO (small bowel obstruction) ([place]) 08/13/2014    PAST SURGICAL HISTORY: Past Surgical History:  Procedure Laterality Date  . ABDOMINAL HYSTERECTOMY    . APPENDECTOMY    . BREAST LUMPECTOMY  2013  . BREAST SURGERY    . CESAREAN SECTION     x2  . CHOLECYSTECTOMY    . PORTACATH PLACEMENT Right 05/17/2017   Procedure: INSERTION PORT-A-CATH right subclavian vein;  Surgeon: Tracy Overall, MD;  Location: [facility];  Service: General;  Laterality: Right;    FAMILY HISTORY Family History  Problem Relation Age of Onset  . Angioedema Mother   . Breast cancer Mother   . Breast cancer Maternal Aunt   The patient was adopted.  Her adoptive parents died in their 26s.  The patient's birth mother however died in her 6s from breast cancer.  The patient believes she has 2 brothers is not sure about sisters.  However she does have 2 maternal aunts who had breast cancer.  There is no history of ovarian cancer in the family to her knowledge.  The patient has no information regarding her father or his side of the  GYNECOLOGIC HISTORY:  No LMP recorded. Patient has had a hysterectomy. Menarche age 33, first live birth age 26, she is [place] P2.  She underwent hysterectomy at age 27.  She tells me they left a fourth of an ovary at that time.  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  She actually lives in [place], [place], and spends 2 weeks here every 2 months at her job.  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  She recently married Tracy Howell who is a Camera operator.  He has 2 children of his own, in [place] and [place].  The patient's own children are  Tracy Howell who lives in [place] and works in Engineer, technical sales, and [employer] lives in [place] and owns a [place].  The patient has no grandchildren.  She is 1/7-day [facility]  (She tells me her religion has no bands on any medical treatments but she cannot have alcohol, cigarettes, or pork).    ADVANCED DIRECTIVES:    HEALTH MAINTENANCE: Social History   Tobacco Use  . Smoking status: Never Smoker  . Smokeless tobacco: Never Used  Substance Use Topics  . Alcohol use: No  . Drug use: No     Colonoscopy:  PAP:  Bone density:   Allergies  Allergen Reactions  . Onion Anaphylaxis, Nausea And Vomiting and Swelling    Throat swelling.  . Other Anaphylaxis and Nausea And Vomiting    Green Peppers-anaphylactic, nausea/vomiting/swelling  Mushrooms-nausea/vomiting.  Patient has hereditary angioedema (HAE)  . Codeine Nausea And Vomiting  . Morphine And Related Nausea And Vomiting  .  Penicillins Rash    Vomiting Has patient had a PCN reaction causing immediate rash, facial/tongue/throat swelling, SOB or lightheadedness with hypotension: Yes Has patient had a PCN reaction causing severe rash involving mucus membranes or skin necrosis: Yes Has patient had a PCN reaction that required hospitalization:Was inpatient when reaction occurred. Has patient had a PCN reaction occurring within the last 10 years: No If all of the above answers are "NO", then may proceed with Cephalosporin use.     Current Outpatient Medications  Medication Sig Dispense Refill  . Ascorbic Acid (VITAMIN C PO) Take 1 tablet by mouth daily.    . B Complex-C (B-COMPLEX WITH VITAMIN C) tablet Take 1 tablet by mouth daily.    . COD LIVER OIL PO Take 1 capsule by mouth daily.    Marland Kitchen HYDROcodone-acetaminophen (NORCO/VICODIN) 5-325 MG tablet Take 1-2 tablets by mouth every 6 (six) hours as needed for moderate pain. 15 tablet 0  . Multiple Vitamin (MULTIVITAMIN WITH MINERALS) TABS tablet  Take 1 tablet by mouth daily.    . naproxen sodium (ALEVE) 220 MG tablet Take 440 mg by mouth 3 (three) times daily as needed (for pain (gelcaps)).      No current facility-administered medications for this visit.    Facility-Administered Medications Ordered in Other Visits  Medication Dose Route Frequency Provider Last Rate Last Dose  . iopamidol (ISOVUE-300) 61 % injection             OBJECTIVE: Middle-aged African-American woman in no acute distress  Vitals:   05/21/17 1543  BP: (!) 151/66  Pulse: 93  Resp: 20  Temp: 98.7 F (37.1 C)  SpO2: 100%     Body mass index is 39.64 kg/m.   Wt Readings from Last 3 Encounters:  05/21/17 249 lb 4.8 oz (113.1 kg)  05/17/17 248 lb 6 oz (112.7 kg)  05/12/17 250 lb 6.4 oz (113.6 kg)      ECOG FS:1 - Symptomatic but completely ambulatory  Sclerae unicteric, EOMs intact Oropharynx clear and moist No cervical or supraclavicular adenopathy Lungs no rales or rhonchi Heart regular rate and rhythm Abd soft, nontender, positive bowel sounds MSK no focal spinal tenderness, no upper extremity lymphedema Neuro: nonfocal, well oriented, appropriate affect Breasts: The left breast is imaged below  Left breast 05/12/2017   LAB RESULTS:  CMP     Component Value Date/Time   NA 142 05/12/2017 0808   K 3.5 05/12/2017 0808   CL 108 08/14/2014 0531   CO2 26 05/12/2017 0808   GLUCOSE 133 05/12/2017 0808   BUN 9.8 05/12/2017 0808   CREATININE 0.9 05/12/2017 0808   CALCIUM 9.3 05/12/2017 0808   PROT 7.3 05/12/2017 0808   ALBUMIN 3.3 (L) 05/12/2017 0808   AST 15 05/12/2017 0808   ALT 10 05/12/2017 0808   ALKPHOS 90 05/12/2017 0808   BILITOT 0.56 05/12/2017 0808   GFRNONAA 83 (L) 08/14/2014 0531   GFRAA >90 08/14/2014 0531    No results found for: TOTALPROTELP, ALBUMINELP, A1GS, A2GS, BETS, BETA2SER, GAMS, MSPIKE, SPEI  No results found for: Nils Pyle, [facility]  Lab Results  Component Value Date   WBC 4.7  05/12/2017   NEUTROABS 2.2 05/12/2017   HGB 13.1 05/12/2017   HCT 39.6 05/12/2017   MCV 91.1 05/12/2017   PLT 270 05/12/2017      Chemistry      Component Value Date/Time   NA 142 05/12/2017 0808   K 3.5 05/12/2017 0808   CL 108 08/14/2014 0531  CO2 26 05/12/2017 0808   BUN 9.8 05/12/2017 0808   CREATININE 0.9 05/12/2017 0808      Component Value Date/Time   CALCIUM 9.3 05/12/2017 0808   ALKPHOS 90 05/12/2017 0808   AST 15 05/12/2017 0808   ALT 10 05/12/2017 0808   BILITOT 0.56 05/12/2017 0808       No results found for: LABCA2  No components found for: TDHRCB638  No results for input(s): INR in the last 168 hours.  No results found for: LABCA2  No results found for: GTX646  No results found for: OEH212  No results found for: YQM250  No results found for: CA2729  No components found for: HGQUANT  No results found for: CEA1 / No results found for: CEA1   No results found for: AFPTUMOR  No results found for: CHROMOGRNA  No results found for: PSA1  No visits with results within 3 Day(s) from this visit.  Latest known visit with results is:  Appointment on 05/12/2017  Component Date Value Ref Range Status  . WBC 05/12/2017 4.7  3.9 - 10.3 10e3/uL Final  . NEUT# 05/12/2017 2.2  1.5 - 6.5 10e3/uL Final  . HGB 05/12/2017 13.1  11.6 - 15.9 g/dL Final  . HCT 05/12/2017 39.6  34.8 - 46.6 % Final  . Platelets 05/12/2017 270  145 - 400 10e3/uL Final  . MCV 05/12/2017 91.1  79.5 - 101.0 fL Final  . MCH 05/12/2017 30.1  25.1 - 34.0 pg Final  . MCHC 05/12/2017 33.1  31.5 - 36.0 g/dL Final  . RBC 05/12/2017 4.35  3.70 - 5.45 10e6/uL Final  . RDW 05/12/2017 13.7  11.2 - 14.5 % Final  . lymph# 05/12/2017 1.9  0.9 - 3.3 10e3/uL Final  . MONO# 05/12/2017 0.4  0.1 - 0.9 10e3/uL Final  . Eosinophils Absolute 05/12/2017 0.2  0.0 - 0.5 10e3/uL Final  . Basophils Absolute 05/12/2017 0.0  0.0 - 0.1 10e3/uL Final  . NEUT% 05/12/2017 46.4  38.4 - 76.8 % Final  . LYMPH%  05/12/2017 40.6  14.0 - 49.7 % Final  . MONO% 05/12/2017 8.5  0.0 - 14.0 % Final  . EOS% 05/12/2017 3.5  0.0 - 7.0 % Final  . BASO% 05/12/2017 1.0  0.0 - 2.0 % Final  . Sodium 05/12/2017 142  136 - 145 mEq/L Final  . Potassium 05/12/2017 3.5  3.5 - 5.1 mEq/L Final  . Chloride 05/12/2017 107  98 - 109 mEq/L Final  . CO2 05/12/2017 26  22 - 29 mEq/L Final  . Glucose 05/12/2017 133  70 - 140 mg/dl Final   Glucose reference range is for nonfasting patients. Fasting glucose reference range is 70- 100.  Marland Kitchen BUN 05/12/2017 9.8  7.0 - 26.0 mg/dL Final  . Creatinine 05/12/2017 0.9  0.6 - 1.1 mg/dL Final  . Total Bilirubin 05/12/2017 0.56  0.20 - 1.20 mg/dL Final  . Alkaline Phosphatase 05/12/2017 90  40 - 150 U/L Final  . AST 05/12/2017 15  5 - 34 U/L Final  . ALT 05/12/2017 10  0 - 55 U/L Final  . Total Protein 05/12/2017 7.3  6.4 - 8.3 g/dL Final  . Albumin 05/12/2017 3.3* 3.5 - 5.0 g/dL Final  . Calcium 05/12/2017 9.3  8.4 - 10.4 mg/dL Final  . Anion Gap 05/12/2017 10  3 - 11 mEq/L Final  . EGFR 05/12/2017 >60  >60 ml/min/1.73 m2 Final   eGFR is calculated using the CKD-EPI Creatinine Equation (2009)    (this displays  the last labs from the last 3 days)  No results found for: TOTALPROTELP, ALBUMINELP, A1GS, A2GS, BETS, BETA2SER, GAMS, MSPIKE, SPEI (this displays SPEP labs)  No results found for: KPAFRELGTCHN, LAMBDASER, KAPLAMBRATIO (kappa/lambda light chains)  No results found for: HGBA, HGBA2QUANT, HGBFQUANT, HGBSQUAN (Hemoglobinopathy evaluation)   No results found for: LDH  No results found for: IRON, TIBC, IRONPCTSAT (Iron and TIBC)  No results found for: FERRITIN  Urinalysis No results found for: COLORURINE, APPEARANCEUR, LABSPEC, PHURINE, GLUCOSEU, HGBUR, BILIRUBINUR, KETONESUR, PROTEINUR, UROBILINOGEN, NITRITE, LEUKOCYTESUR   STUDIES: Ct Chest W Contrast  Result Date: 05/21/2017 CLINICAL DATA:  Followup breast cancer EXAM: CT CHEST, ABDOMEN, AND PELVIS WITH CONTRAST  TECHNIQUE: Multidetector CT imaging of the chest, abdomen and pelvis was performed following the standard protocol during bolus administration of intravenous contrast. CONTRAST:  174m ISOVUE-300 IOPAMIDOL (ISOVUE-300) INJECTION 61% COMPARISON:  Abdomen and pelvis from 08/13/2014 FINDINGS: CT CHEST FINDINGS Cardiovascular: The heart size appears normal. No pericardial effusion. Aortic atherosclerosis noted. Calcification in the LAD coronary artery. Mediastinum/Nodes: The trachea appears patent and is midline. Normal appearance of the esophagus. No enlarged mediastinal or hilar lymph nodes. Prominent left axillary node measures 9 mm, image 15 of series 3. There is a lymph node between the left pectoralis major and minor muscles which measures 9 mm, image 12 of series 3. In the right axilla there is a 1 cm lymph node, image 12 of series 3. Lungs/Pleura: No pleural effusions. Multifocal pulmonary nodules are identified bilaterally. Index nodule within the right upper lobe Measures 8 mm, image 67 of series 5. Superior segment of right lower lobe lung nodule measures 1 cm, image 59 of series 5. In the left upper lobe there is a 7 mm lung nodule, image 44 of series 5. No pleural effusions. Musculoskeletal: Post treatment changes involving the left breast identified including skin thickening and increased soft tissue. See MRI from 05/12/2017 for further details. No aggressive lytic or sclerotic bone lesions. CT ABDOMEN PELVIS FINDINGS Hepatobiliary: Previous cholecystectomy. No biliary dilatation. No suspicious liver abnormalities. Pancreas: Unremarkable. No pancreatic ductal dilatation or surrounding inflammatory changes. Spleen: Normal in size without focal abnormality. Adrenals/Urinary Tract: The adrenal glands are normal. The kidneys appear normal. No hydronephrosis. Urinary bladder is normal. Stomach/Bowel: The stomach is normal. The small bowel loops have a normal course and caliber. No pathologic dilatation of the  colon. Distal colonic diverticular noted without acute inflammation. Vascular/Lymphatic: Aortic atherosclerosis. No aneurysm. No upper abdominal adenopathy. No pelvic or inguinal adenopathy. Reproductive: Status post hysterectomy. No adnexal masses. Other: No abdominal wall hernia or abnormality. No abdominopelvic ascites. Musculoskeletal: No acute or significant osseous findings. IMPRESSION: 1. Multifocal pulmonary nodules are noted in both lungs worrisome for metastatic disease. 2. Prominent bilateral axillary lymph nodes noted measuring up to 1 cm. 3. Post treatment changes noted involving the left breast. 4. Aortic Atherosclerosis (ICD10-I70.0). Lad coronary artery calcifications. Electronically Signed   By: TKerby MoorsM.D.   On: 05/21/2017 09:26   Ct Abdomen Pelvis W Contrast  Result Date: 05/21/2017 CLINICAL DATA:  Followup breast cancer EXAM: CT CHEST, ABDOMEN, AND PELVIS WITH CONTRAST TECHNIQUE: Multidetector CT imaging of the chest, abdomen and pelvis was performed following the standard protocol during bolus administration of intravenous contrast. CONTRAST:  1024mISOVUE-300 IOPAMIDOL (ISOVUE-300) INJECTION 61% COMPARISON:  Abdomen and pelvis from 08/13/2014 FINDINGS: CT CHEST FINDINGS Cardiovascular: The heart size appears normal. No pericardial effusion. Aortic atherosclerosis noted. Calcification in the LAD coronary artery. Mediastinum/Nodes: The trachea appears patent and is midline.  Normal appearance of the esophagus. No enlarged mediastinal or hilar lymph nodes. Prominent left axillary node measures 9 mm, image 15 of series 3. There is a lymph node between the left pectoralis major and minor muscles which measures 9 mm, image 12 of series 3. In the right axilla there is a 1 cm lymph node, image 12 of series 3. Lungs/Pleura: No pleural effusions. Multifocal pulmonary nodules are identified bilaterally. Index nodule within the right upper lobe Measures 8 mm, image 67 of series 5. Superior  segment of right lower lobe lung nodule measures 1 cm, image 59 of series 5. In the left upper lobe there is a 7 mm lung nodule, image 44 of series 5. No pleural effusions. Musculoskeletal: Post treatment changes involving the left breast identified including skin thickening and increased soft tissue. See MRI from 05/12/2017 for further details. No aggressive lytic or sclerotic bone lesions. CT ABDOMEN PELVIS FINDINGS Hepatobiliary: Previous cholecystectomy. No biliary dilatation. No suspicious liver abnormalities. Pancreas: Unremarkable. No pancreatic ductal dilatation or surrounding inflammatory changes. Spleen: Normal in size without focal abnormality. Adrenals/Urinary Tract: The adrenal glands are normal. The kidneys appear normal. No hydronephrosis. Urinary bladder is normal. Stomach/Bowel: The stomach is normal. The small bowel loops have a normal course and caliber. No pathologic dilatation of the colon. Distal colonic diverticular noted without acute inflammation. Vascular/Lymphatic: Aortic atherosclerosis. No aneurysm. No upper abdominal adenopathy. No pelvic or inguinal adenopathy. Reproductive: Status post hysterectomy. No adnexal masses. Other: No abdominal wall hernia or abnormality. No abdominopelvic ascites. Musculoskeletal: No acute or significant osseous findings. IMPRESSION: 1. Multifocal pulmonary nodules are noted in both lungs worrisome for metastatic disease. 2. Prominent bilateral axillary lymph nodes noted measuring up to 1 cm. 3. Post treatment changes noted involving the left breast. 4. Aortic Atherosclerosis (ICD10-I70.0). Lad coronary artery calcifications. Electronically Signed   By: Kerby Moors M.D.   On: 05/21/2017 09:26   Mr Breast Bilateral W Wo Contrast  Result Date: 05/13/2017 CLINICAL DATA:  71 year old female with newly diagnosed invasive left breast cancer. History of left breast cancer with 2 positive lymph nodes and lumpectomy in 2012. LABS:  None performed today.  EXAM: BILATERAL BREAST MRI WITH AND WITHOUT CONTRAST TECHNIQUE: Multiplanar, multisequence MR images of both breasts were obtained prior to and following the intravenous administration of 20 ml of MultiHance. THREE-DIMENSIONAL MR IMAGE RENDERING ON INDEPENDENT WORKSTATION: Three-dimensional MR images were rendered by post-processing of the original MR data on an independent workstation. The three-dimensional MR images were interpreted, and findings are reported in the following complete MRI report for this study. Three dimensional images were evaluated at the independent DynaCad workstation COMPARISON:  Prior mammograms and ultrasounds from [place], with the most recent dated 10/15 - 03/31/2017. FINDINGS: Breast composition: b. Scattered fibroglandular tissue. Background parenchymal enhancement: Mild Right breast: A 7 x 3.5 x 5 cm area of non masslike enhancement with persistent kinetics is identified within the upper-outer right breast (middle and posterior depth). No other suspicious areas of enhancement are identified. Left breast: Abnormal irregular mass and non masslike enhancement throughout all 4 quadrants of the left breast identified. Abnormal skin thickening and enhancement is identified. Biopsy clip artifact within the anterior upper inner left breast is identified corresponding to biopsy-proven malignancy. Lymph nodes: 3 mildly enlarged lymph nodes measuring up to 1.8 cm posterior to the left pectoralis minor muscle noted. A single right axillary lymph node with focally thickened cortex measuring up to 1 cm is present. A 1 cm high left internal mammary lymph  node is identified. Ancillary findings: There is a suggestion of a 1 cm pulmonary nodule within the anterior left upper lobe (image 52 on 3 and 5 minutes axial postcontrast images) IMPRESSION: 1. 7 x 3.5 x 5 cm non masslike enhancement within the upper-outer right breast and focally thickened right axillary lymph node. Second-look ultrasound is  recommended for further evaluation. If the right breast abnormality is not identified sonographically, MR guided biopsy is recommended. 2. Diffuse abnormal enhancement throughout the left breast with skin thickening/enhancement compatible with diffuse multicentric left breast malignancy and dermal involvement. 3. Mildly enlarged lymph nodes posterior to the left pectoralis minor muscle and mildly enlarged left internal mammary lymph node, highly suspicious for metastatic disease. 4. Probable 1 cm left upper lobe pulmonary nodule -metastasis not excluded. Chest or PET-CT recommended for further evaluation. RECOMMENDATION: 1. Second-look right breast and axillary ultrasound and possible biopsies. If no right breast abnormalities are identified sonographically, MR guided right breast biopsy is recommended for the upper-outer non masslike enhancement. 2. Chest or PET CT for evaluation of probable left upper lobe pulmonary nodule. 3. Treatment plan for known left breast malignancy. BI-RADS CATEGORY  4: Suspicious. Electronically Signed   By: Margarette Canada M.D.   On: 05/13/2017 09:59   Dg Chest Port 1 View  Result Date: 05/17/2017 CLINICAL DATA:  Port-A-Cath placement. EXAM: PORTABLE CHEST 1 VIEW COMPARISON:  Intraoperative radiograph from the same day. Two-view chest x-ray 07/30/2016. FINDINGS: The heart is enlarged. Cement direct image demonstrates the tip of the subclavian Port-A-Cath at the cavoatrial junction. Mild pulmonary vascular congestion is present. Bilateral atelectasis is noted. There is no pneumothorax. The visualized soft tissues and bony thorax are otherwise unremarkable. IMPRESSION: 1. Interval placement of right subclavian Port-A-Cath without radiographic evidence for complication. 2. Low lung volumes and mild pulmonary vascular congestion. 3. Bilateral airspace opacities likely reflect atelectasis. Electronically Signed   By: San Morelle M.D.   On: 05/17/2017 08:59   Dg C-arm 1-60 Min-no  Report  Result Date: 05/17/2017 Fluoroscopy was utilized by the requesting physician.  No radiographic interpretation.    ELIGIBLE FOR AVAILABLE RESEARCH PROTOCOL:no  ASSESSMENT: 71 y.o. [place] woman (primarily residing in [place])  (1) status post left lumpectomy in 2012 for a reported pT2 pN1 breast cancer, the patient refusing adjuvant treatment (no chemotherapy, radiation, or antiestrogens).  METASTATIC DISEASE: December 2018  (2) status post left breast biopsy 03/30/2017 for a clinical T4 N0, stage IIIB invasive ductal carcinoma, grade 3, triple positive, with an MIB-1 of 70%  (a) staging studies 05/21/2017 show multiple bilateral pulmonary nodules consistent with stage IV disease 05/21/2017; there are no liver or bone lesions noted  (b) breast MRI 05/13/2017 shows a 7 cm area of non-masslike enhancement in the right breast, with biopsy pending  (3) neoadjuvant chemotherapy with T-DM1 to start 06/29/2016  (a) echocardiogram pending  (4) definitive surgery to follow depending on response: Patient desires bilateral mastectomies  (5) adjuvant radiation as appropriate  (6) anti estrogens after optimization of local therapy  (7) genetics testing  PLAN: I spent a little over 40 minutes with Tracy Howell going over her situation.  She understands the CT of the chest shows stage IV disease.  We went over the images.  She can see these are very small lesions, not readily amenable to biopsy, and not reliably evaluated by PET.  We are therefore taking them as metastatic breast cancer and working from that assumption.  The good news of course is that we do not see  any liver lesions.  We also do not see any bone lesions although a bone scan obtained today has not yet been definitively read.  I propose that she be treated with paclitaxel, trastuzumab and Pertuzumab.  However she refuses chemotherapy because of her very negative earlier experience.  Accordingly we are going to go with T-DM 1.   After 3 cycles she will be restaged.  Of course we will also be able to observe for possible response in the left breast.  If there has not been a significant response after 3 cycles we will go back to the original plan  She is going to continue to travel between here and [facility].  I have urged her to establish herself with an oncologist there.  I gave her the name of 3 oncologists who have good ratings on the Internet for her to explore.  She does wish to have her first 2 treatments here at least.  She will leave here tomorrow and not be back in town until June 23, 2017  She will need to have an echocardiogram.  I have scheduled that for June 24, 2017.  We also will need to biopsy the area of suspicious enhancement in the right breast.  I have set that up at [facility] for June 25, 2017  She will then see is again 06/29/2017 and that will be her first dose of T-DM 1.  She will see me a week later to make sure she is tolerating this well.  After the first 2 doses she may wish to be treated here and/or in [place] as her schedule allows  She has a good understanding of this plan.  She agrees with it.  She knows the goal of treatment in her case is control, since we do not know how to cure stage IV breast cancer.  She also knows however that I have several patients were HER-2 positive for more than 10 years out with no evidence of disease thanks to anti-HER-2 treatment.  She knows to call for any problems that may develop before the next visit.    Tracy Howell, Tracy Dad, MD  05/21/17 4:17 PM Medical Oncology and Hematology [facility] [address] [place], [place] [address] Tel. [phone]    Fax. [phone]  This document serves as a record of services personally performed by Chauncey Cruel, MD. It was created on his behalf by Margit Banda, a trained medical scribe. The creation of this record is based on the scribe's personal observations and the  provider's statements to them.   I have reviewed the above documentation for accuracy and completeness, and I agree with the above.

## 2017-05-21 ENCOUNTER — Encounter (HOSPITAL_COMMUNITY): Payer: Self-pay

## 2017-05-21 ENCOUNTER — Encounter (HOSPITAL_COMMUNITY)
Admission: RE | Admit: 2017-05-21 | Discharge: 2017-05-21 | Disposition: A | Discharge status: Home / Self Care | Payer: Medicare Other | Source: Ambulatory Visit | Attending: Oncology | Admitting: Oncology

## 2017-05-21 ENCOUNTER — Ambulatory Visit (HOSPITAL_BASED_OUTPATIENT_CLINIC_OR_DEPARTMENT_OTHER): Payer: Medicare Other | Admitting: Oncology

## 2017-05-21 ENCOUNTER — Other Ambulatory Visit: Payer: Self-pay | Admitting: Oncology

## 2017-05-21 ENCOUNTER — Ambulatory Visit (HOSPITAL_COMMUNITY)
Admission: RE | Admit: 2017-05-21 | Discharge: 2017-05-21 | Disposition: A | Discharge status: Home / Self Care | Payer: Medicare Other | Source: Ambulatory Visit | Attending: Oncology | Admitting: Oncology

## 2017-05-21 VITALS — BP 151/66 | HR 93 | Temp 98.7°F | Resp 20 | Wt 249.3 lb

## 2017-05-21 DIAGNOSIS — Z170 Estrogen receptor positive status [ER+]: Secondary | ICD-10-CM

## 2017-05-21 DIAGNOSIS — C50912 Malignant neoplasm of unspecified site of left female breast: Secondary | ICD-10-CM | POA: Diagnosis not present

## 2017-05-21 DIAGNOSIS — C50212 Malignant neoplasm of upper-inner quadrant of left female breast: Secondary | ICD-10-CM | POA: Diagnosis not present

## 2017-05-21 DIAGNOSIS — I700 Atherosclerosis of aorta: Secondary | ICD-10-CM | POA: Diagnosis not present

## 2017-05-21 DIAGNOSIS — C7800 Secondary malignant neoplasm of unspecified lung: Secondary | ICD-10-CM

## 2017-05-21 DIAGNOSIS — R918 Other nonspecific abnormal finding of lung field: Secondary | ICD-10-CM | POA: Diagnosis not present

## 2017-05-21 DIAGNOSIS — I2510 Atherosclerotic heart disease of native coronary artery without angina pectoris: Secondary | ICD-10-CM | POA: Insufficient documentation

## 2017-05-21 DIAGNOSIS — Z7189 Other specified counseling: Secondary | ICD-10-CM | POA: Insufficient documentation

## 2017-05-21 MED ORDER — TECHNETIUM TC 99M MEDRONATE IV KIT
25.0000 | PACK | Freq: Once | INTRAVENOUS | Status: AC | PRN
  Administered 2017-05-21: 21 via INTRAVENOUS

## 2017-05-21 MED ORDER — IOPAMIDOL (ISOVUE-300) INJECTION 61%
100.0000 mL | Freq: Once | INTRAVENOUS | Status: AC | PRN
  Administered 2017-05-21: 100 mL via INTRAVENOUS

## 2017-05-21 MED ORDER — IOPAMIDOL (ISOVUE-300) INJECTION 61%
INTRAVENOUS | Status: AC
  Filled 2017-05-21: qty 100

## 2017-06-17 ENCOUNTER — Other Ambulatory Visit: Payer: Medicare Other

## 2017-06-17 ENCOUNTER — Encounter: Payer: Medicare Other | Admitting: Genetics

## 2017-06-24 ENCOUNTER — Ambulatory Visit (HOSPITAL_COMMUNITY)
Admission: RE | Admit: 2017-06-24 | Discharge: 2017-06-24 | Disposition: A | Discharge status: Home / Self Care | Payer: Medicare Other | Source: Ambulatory Visit | Attending: Family Medicine | Admitting: Family Medicine

## 2017-06-24 ENCOUNTER — Other Ambulatory Visit: Payer: Self-pay | Admitting: Radiology

## 2017-06-24 ENCOUNTER — Ambulatory Visit (HOSPITAL_BASED_OUTPATIENT_CLINIC_OR_DEPARTMENT_OTHER)
Admission: RE | Admit: 2017-06-24 | Discharge: 2017-06-24 | Disposition: A | Discharge status: Home / Self Care | Payer: Medicare Other | Source: Ambulatory Visit | Attending: Internal Medicine | Admitting: Internal Medicine

## 2017-06-24 ENCOUNTER — Other Ambulatory Visit: Payer: Self-pay

## 2017-06-24 ENCOUNTER — Encounter (HOSPITAL_COMMUNITY): Payer: Self-pay | Admitting: Internal Medicine

## 2017-06-24 VITALS — BP 152/74 | HR 85 | Wt 250.5 lb

## 2017-06-24 DIAGNOSIS — Z853 Personal history of malignant neoplasm of breast: Secondary | ICD-10-CM | POA: Diagnosis not present

## 2017-06-24 DIAGNOSIS — C773 Secondary and unspecified malignant neoplasm of axilla and upper limb lymph nodes: Secondary | ICD-10-CM | POA: Diagnosis not present

## 2017-06-24 DIAGNOSIS — Z79899 Other long term (current) drug therapy: Secondary | ICD-10-CM | POA: Diagnosis not present

## 2017-06-24 DIAGNOSIS — C7800 Secondary malignant neoplasm of unspecified lung: Secondary | ICD-10-CM | POA: Diagnosis not present

## 2017-06-24 DIAGNOSIS — Z170 Estrogen receptor positive status [ER+]: Secondary | ICD-10-CM | POA: Diagnosis not present

## 2017-06-24 DIAGNOSIS — I2510 Atherosclerotic heart disease of native coronary artery without angina pectoris: Secondary | ICD-10-CM | POA: Diagnosis not present

## 2017-06-24 DIAGNOSIS — I10 Essential (primary) hypertension: Secondary | ICD-10-CM

## 2017-06-24 DIAGNOSIS — C50212 Malignant neoplasm of upper-inner quadrant of left female breast: Secondary | ICD-10-CM

## 2017-06-24 DIAGNOSIS — C50912 Malignant neoplasm of unspecified site of left female breast: Secondary | ICD-10-CM | POA: Diagnosis not present

## 2017-06-24 DIAGNOSIS — R918 Other nonspecific abnormal finding of lung field: Secondary | ICD-10-CM | POA: Diagnosis not present

## 2017-06-24 DIAGNOSIS — R928 Other abnormal and inconclusive findings on diagnostic imaging of breast: Secondary | ICD-10-CM | POA: Diagnosis not present

## 2017-06-24 DIAGNOSIS — I700 Atherosclerosis of aorta: Secondary | ICD-10-CM | POA: Diagnosis not present

## 2017-06-24 LAB — ECHOCARDIOGRAM COMPLETE: Weight: 4008 oz

## 2017-06-24 MED ORDER — PERFLUTREN LIPID MICROSPHERE
1.0000 mL | INTRAVENOUS | Status: AC | PRN
Start: 2017-06-24 — End: 2017-06-24
  Administered 2017-06-24: 4 mL via INTRAVENOUS

## 2017-06-24 NOTE — Progress Notes (Signed)
[place]  Telephone:([phone] Fax:([phone]) [phone]     ID: Nima Bamburg DOB: 1946/04/10  MR#: [id number]  SLH#:[id number]  Patient Care Team: Lucianne Lei, MD as PCP - General (Family Medicine) Alphonsa Overall, MD as Consulting Physician (General Surgery) Magrinat, Virgie Dad, MD as Consulting Physician (Oncology) Eppie Gibson, MD as Attending Physician (Radiation Oncology) Juanita Craver, MD as Consulting Physician (Gastroenterology) OTHER MD:  CHIEF COMPLAINT: triple positive breast cancer  CURRENT TREATMENT: T-DM1   HISTORY OF CURRENT ILLNESS: From the original intake note:  "[name]" tells me she underwent left lumpectomy in [place] on 2012 for a 2.5 cm, grade 2 breast cancer involving one lymph node of 5 sampled. [ It may have been only 2 lymph nodes that were removed she says.]  This was at the [facility], currently the [place] [facility] on [address].  The patient says she was told could have more treatment if she wanted it.  She did not see any sense in it.  She feels her left breast cancer was caused by the fact that she kept her iPhone in her bra right by the left breast.  Sometime in April 2018 she noted a new mass in the left breast. She did not immediately bring it to medical attention but as it continued to grow she mentioned her to her primary care physician and on 03/29/2017 [name] underwent bilateral diagnostic mammography with tomography and left breast ultrasonography at [facility].  The breast density was category B.  In the left breast central to the nipple there was a 6 cm irregular mass with indistinct margins and heterogeneous calcifications.  By ultrasound this measured 5 cm, with indistinct margins, at the 12:00 anterior area.  There was associated edema.  The left axilla was sonographically benign.  Biopsy of the left breast area in question March 30, 2017 showed ([id number]) invasive  ductal carcinoma, grade 3, estrogen receptor 100% positive, progesterone receptor 60% positive, both with strong staining intensity, HER-2 amplified, with a signals ratio of 3.38, and the number per cell 7.95.  The  MIB-1 was 70%.  The patient's subsequent history is as detailed below.  INTERVAL HISTORY: [name] returns today for further evaluation and treatment of her triple positive breast cancer accompanied by her husband. She notes that she had her port placed, but has not been used yet.   Since her last visit she completed a biopsy of the right axillary lymph node ([id number]) showing: Metastatic carcinoma.   REVIEW OF SYSTEMS: [name] reports that she rested and moved in with her husband in [place]. She notes that she feels shooting pains in her left breast that are not constant. She notes that she has clear leakage from the left nipple. She is wondering if she can have her treatment every 4 weeks instead of every 3 weeks. She notes that she is trying to balance treatment with her life.  She denies unusual headaches, visual changes, nausea, vomiting, or dizziness. There has been no unusual cough, phlegm production, or pleurisy. This been no change in bowel or bladder habits. She denies unexplained fatigue or unexplained weight loss, bleeding, rash, or fever. A detailed review of systems was otherwise stable.    PAST MEDICAL HISTORY: Past Medical History:  Diagnosis Date  . Angioedema   . Breast cancer ([place]) 2013  . Complication of anesthesia    slow to wake up   . Pneumonia    hx of x 2   .  recurrent left breast ca dx'd 04/2017  . SBO (small bowel obstruction) ([place]) 08/13/2014    PAST SURGICAL HISTORY: Past Surgical History:  Procedure Laterality Date  . ABDOMINAL HYSTERECTOMY    . APPENDECTOMY    . BREAST LUMPECTOMY  2013  . BREAST SURGERY    . CESAREAN SECTION     x2  . CHOLECYSTECTOMY    . PORTACATH PLACEMENT Right 05/17/2017   Procedure: INSERTION PORT-A-CATH right  subclavian vein;  Surgeon: Alphonsa Overall, MD;  Location: [facility];  Service: General;  Laterality: Right;    FAMILY HISTORY Family History  Problem Relation Age of Onset  . Angioedema Mother   . Breast cancer Mother   . Breast cancer Maternal Aunt   The patient was adopted.  Her adoptive parents died in their 45s.  The patient's birth mother however died in her 58s from breast cancer.  The patient believes she has 2 brothers is not sure about sisters.  However she does have 2 maternal aunts who had breast cancer.  There is no history of ovarian cancer in the family to her knowledge.  The patient has no information regarding her father or his side of the  GYNECOLOGIC HISTORY:  No LMP recorded. Patient has had a hysterectomy. Menarche age 63, first live birth age 19, she is [place] P2.  She underwent hysterectomy at age 8.  She tells me they left a fourth of an ovary at that time.  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  She actually lives in [place], [place], and spends 2 weeks here every 2 months at her job.  When she is in town she actually lives with Dr. Criss Rosales. 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  She recently married [name] who is a Camera operator.  He has 2 children of his own, in [place] and [place].  The patient's own children are [name] who lives in [place] and works in Engineer, technical sales, and [employer] lives in [place] and owns a [employer].  The patient has no grandchildren.  She is 1/7-day [facility].  (She tells me her religion has no bands on any medical treatments but she cannot have alcohol, cigarettes, or pork).    ADVANCED DIRECTIVES:    HEALTH MAINTENANCE: Social History   Tobacco Use  . Smoking status: Never Smoker  . Smokeless tobacco: Never Used    Substance Use Topics  . Alcohol use: No  . Drug use: No     Colonoscopy:  PAP:  Bone density:   Allergies  Allergen Reactions  . Onion Anaphylaxis, Nausea And Vomiting and Swelling    Throat swelling.  . Other Anaphylaxis and Nausea And Vomiting    Green Peppers-anaphylactic, nausea/vomiting/swelling  Mushrooms-nausea/vomiting.  Patient has hereditary angioedema (HAE)  . Codeine Nausea And Vomiting  . Morphine And Related Nausea And Vomiting  . Penicillins Rash    Vomiting Has patient had a PCN reaction causing immediate rash, facial/tongue/throat swelling, SOB or lightheadedness with hypotension: Yes Has patient had a PCN reaction causing severe rash involving mucus membranes or skin necrosis: Yes Has patient had a PCN reaction that required hospitalization:Was inpatient when reaction occurred. Has patient had a PCN reaction occurring within the last 10 years: No If all of the above answers are "NO", then may proceed  with Cephalosporin use.     Current Outpatient Medications  Medication Sig Dispense Refill  . Ascorbic Acid (VITAMIN C PO) Take 1 tablet by mouth daily.    . B Complex-C (B-COMPLEX WITH VITAMIN C) tablet Take 1 tablet by mouth daily.    . COD LIVER OIL PO Take 1 capsule by mouth daily.    Marland Kitchen lidocaine-prilocaine (EMLA) cream Apply 1 application topically as needed. 30 g 0  . Multiple Vitamin (MULTIVITAMIN WITH MINERALS) TABS tablet Take 1 tablet by mouth daily.    . naproxen sodium (ALEVE) 220 MG tablet Take 440 mg by mouth 3 (three) times daily as needed (for pain (gelcaps)).      No current facility-administered medications for this visit.     OBJECTIVE: Middle-aged African-American woman in no acute distress  Vitals:   06/29/17 0908  BP: (!) 148/74  Pulse: 89  Resp: 18  Temp: 98.4 F (36.9 C)     Body mass index is 39.49 kg/m.   Wt Readings from Last 3 Encounters:  06/29/17 248 lb 6.4 oz (112.7 kg)  06/24/17 250 lb 8 oz (113.6 kg)  05/21/17  249 lb 4.8 oz (113.1 kg)      ECOG FS:1 - Symptomatic but completely ambulatory  Sclerae unicteric, pupils round and equal Oropharynx clear and moist No cervical or supraclavicular adenopathy Lungs no rales or rhonchi Heart regular rate and rhythm Abd soft, nontender, positive bowel sounds MSK no focal spinal tenderness, no upper extremity lymphedema Neuro: nonfocal, well oriented, appropriate affect Breasts: The right breast is unremarkable.  The left breast is imaged below.    Left Breast 06/29/2017      LAB RESULTS:  CMP     Component Value Date/Time   NA 142 06/29/2017 0846   NA 142 05/12/2017 0808   K 3.8 06/29/2017 0846   K 3.5 05/12/2017 0808   CL 107 06/29/2017 0846   CO2 26 06/29/2017 0846   CO2 26 05/12/2017 0808   GLUCOSE 98 06/29/2017 0846   GLUCOSE 133 05/12/2017 0808   BUN 14 06/29/2017 0846   BUN 9.8 05/12/2017 0808   CREATININE 0.9 05/12/2017 0808   CALCIUM 9.4 06/29/2017 0846   CALCIUM 9.3 05/12/2017 0808   PROT 7.3 06/29/2017 0846   PROT 7.3 05/12/2017 0808   ALBUMIN 3.2 (L) 06/29/2017 0846   ALBUMIN 3.3 (L) 05/12/2017 0808   AST 14 06/29/2017 0846   AST 15 05/12/2017 0808   ALT 8 06/29/2017 0846   ALT 10 05/12/2017 0808   ALKPHOS 110 06/29/2017 0846   ALKPHOS 90 05/12/2017 0808   BILITOT 0.3 06/29/2017 0846   BILITOT 0.56 05/12/2017 0808   GFRNONAA 83 (L) 08/14/2014 0531   GFRAA >90 08/14/2014 0531    No results found for: TOTALPROTELP, ALBUMINELP, A1GS, A2GS, BETS, BETA2SER, GAMS, MSPIKE, SPEI  No results found for: Nils Pyle, [facility]  Lab Results  Component Value Date   WBC 4.7 05/12/2017   NEUTROABS 2.9 06/29/2017   HGB 13.1 05/12/2017   HCT 39.2 06/29/2017   MCV 90.7 06/29/2017   PLT 270 05/12/2017      Chemistry      Component Value Date/Time   NA 142 06/29/2017 0846   NA 142 05/12/2017 0808   K 3.8 06/29/2017 0846   K 3.5 05/12/2017 0808   CL 107 06/29/2017 0846   CO2 26 06/29/2017 0846   CO2  26 05/12/2017 0808   BUN 14 06/29/2017 0846   BUN 9.8 05/12/2017 0947  CREATININE 0.9 05/12/2017 0808      Component Value Date/Time   CALCIUM 9.4 06/29/2017 0846   CALCIUM 9.3 05/12/2017 0808   ALKPHOS 110 06/29/2017 0846   ALKPHOS 90 05/12/2017 0808   AST 14 06/29/2017 0846   AST 15 05/12/2017 0808   ALT 8 06/29/2017 0846   ALT 10 05/12/2017 0808   BILITOT 0.3 06/29/2017 0846   BILITOT 0.56 05/12/2017 0808       No results found for: LABCA2  No components found for: [id number]  No results for input(s): INR in the last 168 hours.  No results found for: LABCA2  No results found for: WNU272  No results found for: ZDG644  No results found for: IHK742  No results found for: CA2729  No components found for: HGQUANT  No results found for: CEA1 / No results found for: CEA1   No results found for: AFPTUMOR  No results found for: CHROMOGRNA  No results found for: PSA1  Appointment on 06/29/2017  Component Date Value Ref Range Status  . WBC Count 06/29/2017 6.0  3.9 - 10.3 K/uL Final  . RBC 06/29/2017 4.32  3.70 - 5.45 MIL/uL Final  . Hemoglobin 06/29/2017 12.8  11.6 - 15.9 g/dL Final  . HCT 06/29/2017 39.2  34.8 - 46.6 % Final  . MCV 06/29/2017 90.7  79.5 - 101.0 fL Final  . MCH 06/29/2017 29.7  25.1 - 34.0 pg Final  . MCHC 06/29/2017 32.7  31.5 - 36.0 g/dL Final  . RDW 06/29/2017 13.7  11.2 - 16.1 % Final  . Platelet Count 06/29/2017 269  145 - 400 K/uL Final  . Neutrophils Relative % 06/29/2017 48  % Final  . Neutro Abs 06/29/2017 2.9  1.5 - 6.5 K/uL Final  . Lymphocytes Relative 06/29/2017 38  % Final  . Lymphs Abs 06/29/2017 2.3  0.9 - 3.3 K/uL Final  . Monocytes Relative 06/29/2017 9  % Final  . Monocytes Absolute 06/29/2017 0.5  0.1 - 0.9 K/uL Final  . Eosinophils Relative 06/29/2017 4  % Final  . Eosinophils Absolute 06/29/2017 0.3  0.0 - 0.5 K/uL Final  . Basophils Relative 06/29/2017 1  % Final  . Basophils Absolute 06/29/2017 0.1  0.0 - 0.1 K/uL  Final   Performed at [facility], [place] [address] [place], [place] [address]  . Sodium 06/29/2017 142  136 - 145 mmol/L Final  . Potassium 06/29/2017 3.8  3.3 - 4.7 mmol/L Final  . Chloride 06/29/2017 107  98 - 109 mmol/L Final  . CO2 06/29/2017 26  22 - 29 mmol/L Final  . Glucose, Bld 06/29/2017 98  70 - 140 mg/dL Final  . BUN 06/29/2017 14  7 - 26 mg/dL Final  . Creatinine 06/29/2017 0.80  0.60 - 1.10 mg/dL Final  . Calcium 06/29/2017 9.4  8.4 - 10.4 mg/dL Final  . Total Protein 06/29/2017 7.3  6.4 - 8.3 g/dL Final  . Albumin 06/29/2017 3.2* 3.5 - 5.0 g/dL Final  . AST 06/29/2017 14  5 - 34 U/L Final  . ALT 06/29/2017 8  0 - 55 U/L Final  . Alkaline Phosphatase 06/29/2017 110  40 - 150 U/L Final  . Total Bilirubin 06/29/2017 0.3  0.2 - 1.2 mg/dL Final  . GFR, Est Non Af Am 06/29/2017 >60  >60 mL/min Final  . GFR, Est AFR Am 06/29/2017 >60  >60 mL/min Final   Comment: (NOTE) The eGFR has been calculated using the CKD EPI equation. This calculation  has not been validated in all clinical situations. eGFR's persistently <60 mL/min signify possible Chronic Kidney Disease.   Georgiann Hahn gap 06/29/2017 9  3 - 11 Final   Performed at [facility], [place] [address] [place], [place] [address]    (this displays the last labs from the last 3 days)  No results found for: TOTALPROTELP, ALBUMINELP, A1GS, A2GS, BETS, BETA2SER, GAMS, MSPIKE, SPEI (this displays SPEP labs)  No results found for: KPAFRELGTCHN, LAMBDASER, KAPLAMBRATIO (kappa/lambda light chains)  No results found for: HGBA, HGBA2QUANT, HGBFQUANT, HGBSQUAN (Hemoglobinopathy evaluation)   No results found for: LDH  No results found for: IRON, TIBC, IRONPCTSAT (Iron and TIBC)  No results found for: FERRITIN  Urinalysis No results found for: COLORURINE, APPEARANCEUR, LABSPEC, PHURINE, GLUCOSEU, HGBUR, BILIRUBINUR, KETONESUR, PROTEINUR, UROBILINOGEN, NITRITE,  LEUKOCYTESUR   STUDIES: I discussed the recent biopsy with the patient and gave her a copy of the pathology  ELIGIBLE FOR AVAILABLE RESEARCH PROTOCOL:no  ASSESSMENT: 72 y.o. [place] woman (primarily residing in [place])  (1) status post left lumpectomy in 2012 for a reported pT2 pN1 breast cancer, the patient refusing adjuvant treatment (no chemotherapy, radiation, or antiestrogens).  METASTATIC DISEASE: December 2018 (2) status post left breast biopsy 03/30/2017 for a clinical T4 N0, stage IIIB invasive ductal carcinoma, grade 3, triple positive, with an MIB-1 of 70%  (a) staging studies 05/21/2017 show multiple bilateral pulmonary nodules consistent with stage IV disease 05/21/2017; there are no liver or bone lesions noted  (b) breast MRI 05/13/2017 shows a 7 cm area of non-masslike enhancement in the right breast, with biopsy ([id number]) on 06/24/2017 showing: Metastatic carcinoma. Prognostic panel requested 01/15  (3) neoadjuvant chemotherapy with T-DM1 started 06/29/2016  (a) echocardiogram 06/24/2017 showed an ejection fraction of 60-65 %  (4) definitive surgery to follow depending on response: Patient desires bilateral mastectomies  (5) adjuvant radiation as appropriate  (6) anti estrogens after optimization of local therapy  (7) genetics testing  PLAN: I spent approximately 30 minutes with Dorian Pod with most of that time spent discussing her complex problems, including her right sided diagnosis and the complications of her living arrangements, which means she is in town part-time and in [place] most of the time.  She very clearly wishes to have all her medical.  I reviewed the echocardiogram results with [name], and also reviewed the possible toxicity side effects and complications of T-DM 1.  Just in case I am going to see her again next week to make sure she has had no unusual complications.  We will also recheck her lab work at that time.  I wrote for her EMLA cream  and instructed her on how to use it at the next visit.  The plan is for TDM 1 4 at least 6 cycles or to optimal response.  At some point we will switch her over to trastuzumab alone and at that time we will move her treatments to every 4 weeks, which would be a lot more convenient for her.  At that time we will also start her on antiestrogens.  I have requested a prognostic panel on the right-sided biopsy just to make sure it is also triple positive  She knows to call for any issues that may develop before her next visit.   Magrinat, Virgie Dad, MD  06/29/17 9:38 AM Medical Oncology and Hematology [facility] [address] [place], [place] [address] Tel. [phone]    Fax. [phone]  This document serves as a record  of services personally performed by Lurline Del, MD. It was created on his behalf by Sheron Nightingale, a trained medical scribe. The creation of this record is based on the scribe's personal observations and the provider's statements to them.   I have reviewed the above documentation for accuracy and completeness, and I agree with the above.

## 2017-06-24 NOTE — Progress Notes (Signed)
  Echocardiogram 2D Echocardiogram has been performed.  Tracy Howell 06/24/2017, 12:08 PM

## 2017-06-24 NOTE — Patient Instructions (Signed)
Your physician recommends that you schedule a follow-up appointment in: 3 with echocardiogram

## 2017-06-24 NOTE — Progress Notes (Signed)
Cardio-Oncology Clinic Consult Note   Referring Physician: Dr. Jana Hakim Primary Care: Primary Cardiologist: New (Dr. Haroldine Laws) Primary oncologist: Dr. Jana Hakim  HPI: Tracy Howell is a 72 y.o. 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  Left Breast CA Biopsy of the left breast area in question March 30, 2017 showed ([id number]) invasive ductal carcinoma, grade 3, estrogen receptor 100% positive, progesterone receptor 60% positive, both with strong staining intensity, HER-2 amplified, with a signals ratio of 3.38, and the number per cell 7.95.  The  MIB-1 was 70%.  Cancer history (1) status post left lumpectomy in 2012 for a reported pT2 pN1 breast cancer, the patient refusing adjuvant treatment (no chemotherapy, radiation, or antiestrogens). METASTATIC DISEASE: December 2018  (2) status post left breast biopsy 03/30/2017 for a clinical T4 N0, stage IIIB invasive ductal carcinoma, grade 3, triple positive, with an MIB-1 of 70%             (a) staging studies 05/21/2017 show multiple bilateral pulmonary nodules consistent with stage IV disease 05/21/2017; there are no liver or bone lesions noted             (b) breast MRI 05/13/2017 shows a 7 cm area of non-masslike enhancement in the right breast, with biopsy pending  (3) neoadjuvant chemotherapy with T-DM1 to start 06/29/2016 (4) definitive surgery to follow depending on response: Patient desires bilateral mastectomies (5) adjuvant radiation as appropriate (6) anti estrogens after optimization of local therapy (7) genetics testing  She presents today to establish in the cardio-oncology clinic. She is here with her husband. Overall she feels OK, just has a lot going on with CA diagnosis. She reports mild SOB with moderate exertion at  baseline. This is unchanged. Denies on flat ground or with ADLs.  She occasionally has mild peripheral edema, mostly when she has been on her feet for prolonged periods. This usually is worse in the evening and resolved by morning. No CP, orthopnea or PND.  Of note, she drove 12 hours yesterday for her appointments today. Has a biopsy at 1 for a lump in her R breast.    Echo today shows LVEF  60-65%, Grade 1 DD, LS' 9.1, GLS   05/21/2017 Chest CT 1. Multifocal pulmonary nodules are noted in both lungs worrisome for metastatic disease. 2. Prominent bilateral axillary lymph nodes noted measuring up to 1 cm. 3. Post treatment changes noted involving the left breast. 4. Aortic Atherosclerosis (ICD10-I70.0). Lad coronary artery calcifications.  Review of Systems: [y] = yes, '[ ]'$  = no   General: Weight gain '[ ]'$ ; Weight loss '[ ]'$ ; Anorexia '[ ]'$ ; Fatigue '[ ]'$ ; Fever '[ ]'$ ; Chills '[ ]'$ ; Weakness '[ ]'$   Cardiac: Chest pain/pressure '[ ]'$ ; Resting SOB '[ ]'$ ; Exertional SOB [y]; Orthopnea '[ ]'$ ; Pedal Edema '[ ]'$ ; Palpitations '[ ]'$ ; Syncope '[ ]'$ ; Presyncope '[ ]'$ ; Paroxysmal nocturnal dyspnea'[ ]'$   Pulmonary: Cough '[ ]'$ ; Wheezing'[ ]'$ ; Hemoptysis'[ ]'$ ; Sputum '[ ]'$ ; Snoring '[ ]'$   GI: Vomiting'[ ]'$ ; Dysphagia'[ ]'$ ; Melena'[ ]'$ ; Hematochezia '[ ]'$ ; Heartburn'[ ]'$ ; Abdominal pain '[ ]'$ ; Constipation '[ ]'$ ; Diarrhea '[ ]'$ ; BRBPR '[ ]'$   GU: Hematuria'[ ]'$ ; Dysuria '[ ]'$ ; Nocturia'[ ]'$   Vascular: Pain in legs with walking '[ ]'$ ; Pain in feet with lying flat '[ ]'$ ; Non-healing sores '[ ]'$ ; Stroke '[ ]'$ ; TIA '[ ]'$ ; Slurred speech '[ ]'$ ;  Neuro: Headaches'[ ]'$ ; Vertigo'[ ]'$ ; Seizures'[ ]'$ ; Paresthesias'[ ]'$ ;Blurred vision '[ ]'$ ; Diplopia '[ ]'$ ; Vision changes '[ ]'$   Ortho/Skin: Arthritis [y]; Joint pain [y]; Muscle pain '[ ]'$ ; Joint swelling '[ ]'$ ; Back Pain '[ ]'$ ; Rash '[ ]'$   Psych: Depression'[ ]'$ ; Anxiety'[ ]'$   Heme: Bleeding problems '[ ]'$ ; Clotting disorders '[ ]'$ ; Anemia '[ ]'$   Endocrine: Diabetes '[ ]'$ ; Thyroid dysfunction'[ ]'$    Past Medical History:  Diagnosis Date  . Angioedema   . Breast cancer  ([facility]) 2013  . Complication of anesthesia    slow to wake up   . Pneumonia    hx of x 2   . recurrent left breast ca dx'd 04/2017  . SBO (small bowel obstruction) ([facility]) 08/13/2014    Current Outpatient Medications  Medication Sig Dispense Refill  . Ascorbic Acid (VITAMIN C PO) Take 1 tablet by mouth daily.    . B Complex-C (B-COMPLEX WITH VITAMIN C) tablet Take 1 tablet by mouth daily.    . COD LIVER OIL PO Take 1 capsule by mouth daily.    Marland Kitchen HYDROcodone-acetaminophen (NORCO/VICODIN) 5-325 MG tablet Take 1-2 tablets by mouth every 6 (six) hours as needed for moderate pain. 15 tablet 0  . Multiple Vitamin (MULTIVITAMIN WITH MINERALS) TABS tablet Take 1 tablet by mouth daily.    . naproxen sodium (ALEVE) 220 MG tablet Take 440 mg by mouth 3 (three) times daily as needed (for pain (gelcaps)).      No current facility-administered medications for this encounter.     Allergies  Allergen Reactions  . Onion Anaphylaxis, Nausea And Vomiting and Swelling    Throat swelling.  . Other Anaphylaxis and Nausea And Vomiting    Green Peppers-anaphylactic, nausea/vomiting/swelling  Mushrooms-nausea/vomiting.  Patient has hereditary angioedema (HAE)  . Codeine Nausea And Vomiting  . Morphine And Related Nausea And Vomiting  . Penicillins Rash    Vomiting Has patient had a PCN reaction causing immediate rash, facial/tongue/throat swelling, SOB or lightheadedness with hypotension: Yes Has patient had a PCN reaction causing severe rash involving mucus membranes or skin necrosis: Yes Has patient had a PCN reaction that required hospitalization:Was inpatient when reaction occurred. Has patient had a PCN reaction occurring within the last 10 years: No If all of the above answers are "NO", then may proceed with Cephalosporin use.    Social History   Socioeconomic History  . Marital status: Married    Spouse name: Not on file  . Number of children: Not on file  . Years of education: Not on file    . Highest education level: Not on file  Social Needs  . Financial resource strain: Not on file  . Food insecurity - worry: Not on file  . Food insecurity - inability: Not on file  . Transportation needs - medical: Not on file  . Transportation needs - non-medical: Not on file  Occupational History  . Not on file  Tobacco Use  . Smoking status: Never Smoker  . Smokeless tobacco: Never Used  Substance and Sexual Activity  . Alcohol use: No  . Drug use: No  . Sexual activity: Not on file  Other Topics Concern  . Not on file  Social History Narrative  . Not on file    Family History  Problem Relation Age of Onset  . Angioedema Mother   . Breast cancer Mother   . Breast cancer Maternal Aunt    Vitals:   06/24/17 1206  BP: (!) 152/74  Pulse: 85  SpO2: 100%  Weight: 250 lb 8 oz (113.6 kg)   Wt Readings from Last 3 Encounters:  06/24/17 250 lb 8 oz (113.6 kg)  05/21/17 249 lb 4.8 oz (113.1 kg)  05/17/17 248 lb 6 oz (112.7 kg)   PHYSICAL EXAM: General:  Well appearing. No respiratory difficulty HEENT: normal Neck: supple. no JVD. Carotids 2+ bilat; no bruits. No lymphadenopathy or thyromegaly appreciated. Cor: PMI nondisplaced. Regular rate & rhythm. No rubs, gallops or murmurs. Lungs: Clear Abdomen: obese, soft, nontender, nondistended. No hepatosplenomegaly. No bruits or masses. Good bowel sounds. Extremities: no cyanosis, clubbing, or rash. Trace ankle edema. Neuro: alert & oriented x 3, cranial nerves grossly intact. moves all 4 extremities w/o difficulty. Affect pleasant.  ASSESSMENT & PLAN:  1. Left Breast CA, Stage IV with pulmonary nodules. No liver or bone mets at this time.  - Biopsy of the left breast area in question March 30, 2017 showed ([id number]) invasive ductal carcinoma, grade 3, estrogen receptor 100% positive, progesterone receptor 60% positive, both with strong staining intensity, HER-2 amplified, with a signals ratio of 3.38, and the number per  cell 7.95.  The  MIB-1 was 70%. - Echo 06/24/17 shows LVEF  60-65%, Grade 1 DD, LS' 9.1, GLS  - Echo today personally reviewed by Dr. Haroldine Laws. Stable. She is fine to start Herceptin  2. HTN - Elevated today but in the midst of high stress. She refuses medication today.  - Will follow for need. Encouraged to follow BPs at home. She states it runs normal at home.  Follow up 3 months with Echo.   Explained incidence of Herceptin cardiotoxicity and role of Cardio-oncology clinic at length. Echo images reviewed personally. All parameters stable. Reviewed signs and symptoms of HF to look for. Ok to begin Biologic therapy.   Shirley Friar, PA-C 06/24/17  12:28 PM    Patient seen and examined with the above-signed Advanced Practice Provider and/or Housestaff. I personally reviewed laboratory data, imaging studies and relevant notes. I independently examined the patient and formulated the important aspects of the plan. I have edited the note to reflect any of my changes or salient points. I have personally discussed the plan with the patient and/or family.  Explained incidence of Herceptin cardiotoxicity and role of Cardio-oncology clinic at length. Echo images reviewed personally. All parameters stable. Reviewed signs and symptoms of HF to look for. Continue Herceptin. Follow-up with echo in 3 months. BP high today but she says it is usually OK. We will follow.   Glori Bickers, MD  9:22 PM

## 2017-06-25 ENCOUNTER — Other Ambulatory Visit: Payer: Self-pay

## 2017-06-25 ENCOUNTER — Other Ambulatory Visit: Payer: Medicare Other

## 2017-06-25 DIAGNOSIS — Z170 Estrogen receptor positive status [ER+]: Principal | ICD-10-CM

## 2017-06-25 DIAGNOSIS — C50212 Malignant neoplasm of upper-inner quadrant of left female breast: Secondary | ICD-10-CM

## 2017-06-29 ENCOUNTER — Inpatient Hospital Stay: Payer: Medicare Other

## 2017-06-29 ENCOUNTER — Inpatient Hospital Stay: Payer: Medicare Other | Attending: Oncology

## 2017-06-29 ENCOUNTER — Telehealth: Payer: Self-pay | Admitting: Oncology

## 2017-06-29 ENCOUNTER — Inpatient Hospital Stay (HOSPITAL_BASED_OUTPATIENT_CLINIC_OR_DEPARTMENT_OTHER): Payer: Medicare Other | Admitting: Oncology

## 2017-06-29 VITALS — BP 148/74 | HR 89 | Temp 98.4°F | Resp 18 | Ht 66.5 in | Wt 248.4 lb

## 2017-06-29 DIAGNOSIS — C50212 Malignant neoplasm of upper-inner quadrant of left female breast: Secondary | ICD-10-CM

## 2017-06-29 DIAGNOSIS — Z853 Personal history of malignant neoplasm of breast: Secondary | ICD-10-CM | POA: Insufficient documentation

## 2017-06-29 DIAGNOSIS — C7800 Secondary malignant neoplasm of unspecified lung: Secondary | ICD-10-CM

## 2017-06-29 DIAGNOSIS — Z5112 Encounter for antineoplastic immunotherapy: Secondary | ICD-10-CM | POA: Diagnosis not present

## 2017-06-29 DIAGNOSIS — K56609 Unspecified intestinal obstruction, unspecified as to partial versus complete obstruction: Secondary | ICD-10-CM

## 2017-06-29 DIAGNOSIS — Z803 Family history of malignant neoplasm of breast: Secondary | ICD-10-CM

## 2017-06-29 DIAGNOSIS — Z170 Estrogen receptor positive status [ER+]: Principal | ICD-10-CM

## 2017-06-29 DIAGNOSIS — C50912 Malignant neoplasm of unspecified site of left female breast: Secondary | ICD-10-CM | POA: Insufficient documentation

## 2017-06-29 LAB — CBC WITH DIFFERENTIAL (CANCER CENTER ONLY)
BASOS ABS: 0.1 10*3/uL (ref 0.0–0.1)
BASOS PCT: 1 %
Eosinophils Absolute: 0.3 10*3/uL (ref 0.0–0.5)
Eosinophils Relative: 4 %
HEMATOCRIT: 39.2 % (ref 34.8–46.6)
Hemoglobin: 12.8 g/dL (ref 11.6–15.9)
Lymphocytes Relative: 38 %
Lymphs Abs: 2.3 10*3/uL (ref 0.9–3.3)
MCH: 29.7 pg (ref 25.1–34.0)
MCHC: 32.7 g/dL (ref 31.5–36.0)
MCV: 90.7 fL (ref 79.5–101.0)
MONO ABS: 0.5 10*3/uL (ref 0.1–0.9)
Monocytes Relative: 9 %
NEUTROS ABS: 2.9 10*3/uL (ref 1.5–6.5)
NEUTROS PCT: 48 %
Platelet Count: 269 10*3/uL (ref 145–400)
RBC: 4.32 MIL/uL (ref 3.70–5.45)
RDW: 13.7 % (ref 11.2–16.1)
WBC Count: 6 10*3/uL (ref 3.9–10.3)

## 2017-06-29 LAB — CMP (CANCER CENTER ONLY)
ALBUMIN: 3.2 g/dL — AB (ref 3.5–5.0)
ALT: 8 U/L (ref 0–55)
AST: 14 U/L (ref 5–34)
Alkaline Phosphatase: 110 U/L (ref 40–150)
Anion gap: 9 (ref 3–11)
BILIRUBIN TOTAL: 0.3 mg/dL (ref 0.2–1.2)
BUN: 14 mg/dL (ref 7–26)
CHLORIDE: 107 mmol/L (ref 98–109)
CO2: 26 mmol/L (ref 22–29)
Calcium: 9.4 mg/dL (ref 8.4–10.4)
Creatinine: 0.8 mg/dL (ref 0.60–1.10)
GFR, Est AFR Am: >60 mL/min (ref >60)
GFR, Estimated: >60 mL/min (ref >60)
GLUCOSE: 98 mg/dL (ref 70–140)
Potassium: 3.8 mmol/L (ref 3.3–4.7)
Sodium: 142 mmol/L (ref 136–145)
Total Protein: 7.3 g/dL (ref 6.4–8.3)

## 2017-06-29 MED ORDER — SODIUM CHLORIDE 0.9 % IV SOLN
3.6000 mg/kg | Freq: Once | INTRAVENOUS | Status: AC
  Administered 2017-06-29: 400 mg via INTRAVENOUS
  Filled 2017-06-29: qty 20

## 2017-06-29 MED ORDER — HEPARIN SOD (PORK) LOCK FLUSH 100 UNIT/ML IV SOLN
500.0000 [IU] | Freq: Once | INTRAVENOUS | Status: AC | PRN
  Administered 2017-06-29: 500 [IU]
  Filled 2017-06-29: qty 5

## 2017-06-29 MED ORDER — DIPHENHYDRAMINE HCL 25 MG PO CAPS
25.0000 mg | ORAL_CAPSULE | Freq: Once | ORAL | Status: AC
  Administered 2017-06-29: 25 mg via ORAL

## 2017-06-29 MED ORDER — SODIUM CHLORIDE 0.9% FLUSH
10.0000 mL | INTRAVENOUS | Status: DC | PRN
  Administered 2017-06-29: 10 mL
  Filled 2017-06-29: qty 10

## 2017-06-29 MED ORDER — ACETAMINOPHEN 325 MG PO TABS
650.0000 mg | ORAL_TABLET | Freq: Once | ORAL | Status: AC
  Administered 2017-06-29: 650 mg via ORAL

## 2017-06-29 MED ORDER — SODIUM CHLORIDE 0.9 % IV SOLN
Freq: Once | INTRAVENOUS | Status: AC
  Administered 2017-06-29: 10:00:00 via INTRAVENOUS

## 2017-06-29 MED ORDER — LIDOCAINE-PRILOCAINE 2.5-2.5 % EX CREA: 1.0000 "application " | TOPICAL_CREAM | CUTANEOUS | 0 refills | Status: DC | PRN

## 2017-06-29 NOTE — Patient Instructions (Signed)
[facility] Discharge Instructions for Patients Receiving Chemotherapy  Today you received the following chemotherapy agents Kadcyla  To help prevent nausea and vomiting after your treatment, we encourage you to take your nausea medication as directed   If you develop nausea and vomiting that is not controlled by your nausea medication, call the clinic.   BELOW ARE SYMPTOMS THAT SHOULD BE REPORTED IMMEDIATELY:  *FEVER GREATER THAN 100.5 F  *CHILLS WITH OR WITHOUT FEVER  NAUSEA AND VOMITING THAT IS NOT CONTROLLED WITH YOUR NAUSEA MEDICATION  *UNUSUAL SHORTNESS OF BREATH  *UNUSUAL BRUISING OR BLEEDING  TENDERNESS IN MOUTH AND THROAT WITH OR WITHOUT PRESENCE OF ULCERS  *URINARY PROBLEMS  *BOWEL PROBLEMS  UNUSUAL RASH Items with * indicate a potential emergency and should be followed up as soon as possible.  Feel free to call the clinic should you have any questions or concerns. The clinic phone number is [phone].  Please show the CHEMO ALERT CARD at check-in to the Emergency Department and triage nurse.   

## 2017-06-29 NOTE — Telephone Encounter (Signed)
Patient scheduled per 1/15 los. Patient will receive updated copy of schedule in treatment area.

## 2017-07-01 ENCOUNTER — Telehealth: Payer: Self-pay | Admitting: Oncology

## 2017-07-01 ENCOUNTER — Telehealth: Payer: Self-pay | Admitting: *Deleted

## 2017-07-01 NOTE — Telephone Encounter (Signed)
Patient called in to cancel Dr appointment on 1/21 she said she didn't need to see him. She wanted and earlier time for lab and flush

## 2017-07-01 NOTE — Telephone Encounter (Signed)
"  You just called me but I could not get to the phone.  They said Navigator.  Mam, I don't know which one.called.  Call transferred to a navigator as requested by patient.

## 2017-07-02 ENCOUNTER — Other Ambulatory Visit: Payer: Self-pay | Admitting: *Deleted

## 2017-07-02 DIAGNOSIS — C50212 Malignant neoplasm of upper-inner quadrant of left female breast: Secondary | ICD-10-CM

## 2017-07-02 DIAGNOSIS — Z170 Estrogen receptor positive status [ER+]: Principal | ICD-10-CM

## 2017-07-05 ENCOUNTER — Other Ambulatory Visit: Payer: Medicare Other

## 2017-07-05 ENCOUNTER — Inpatient Hospital Stay: Payer: Medicare Other

## 2017-07-05 ENCOUNTER — Ambulatory Visit: Payer: Medicare Other | Admitting: Oncology

## 2017-07-05 DIAGNOSIS — C50212 Malignant neoplasm of upper-inner quadrant of left female breast: Secondary | ICD-10-CM

## 2017-07-05 DIAGNOSIS — Z853 Personal history of malignant neoplasm of breast: Secondary | ICD-10-CM | POA: Diagnosis not present

## 2017-07-05 DIAGNOSIS — Z5112 Encounter for antineoplastic immunotherapy: Secondary | ICD-10-CM | POA: Diagnosis not present

## 2017-07-05 DIAGNOSIS — Z95828 Presence of other vascular implants and grafts: Secondary | ICD-10-CM

## 2017-07-05 DIAGNOSIS — Z170 Estrogen receptor positive status [ER+]: Secondary | ICD-10-CM

## 2017-07-05 DIAGNOSIS — C50912 Malignant neoplasm of unspecified site of left female breast: Secondary | ICD-10-CM | POA: Diagnosis not present

## 2017-07-05 DIAGNOSIS — Z803 Family history of malignant neoplasm of breast: Secondary | ICD-10-CM | POA: Diagnosis not present

## 2017-07-05 LAB — CBC WITH DIFFERENTIAL (CANCER CENTER ONLY)
Basophils Absolute: 0.1 10*3/uL (ref 0.0–0.1)
Basophils Relative: 1 %
EOS ABS: 0.2 10*3/uL (ref 0.0–0.5)
EOS PCT: 3 %
HCT: 39.8 % (ref 34.8–46.6)
Hemoglobin: 13.4 g/dL (ref 11.6–15.9)
LYMPHS ABS: 2.2 10*3/uL (ref 0.9–3.3)
Lymphocytes Relative: 31 %
MCH: 29.7 pg (ref 25.1–34.0)
MCHC: 33.6 g/dL (ref 31.5–36.0)
MCV: 88.5 fL (ref 79.5–101.0)
MONO ABS: 0.8 10*3/uL (ref 0.1–0.9)
MONOS PCT: 11 %
Neutro Abs: 3.8 10*3/uL (ref 1.5–6.5)
Neutrophils Relative %: 54 %
Platelet Count: 67 10*3/uL — ABNORMAL LOW (ref 145–400)
RBC: 4.5 MIL/uL (ref 3.70–5.45)
RDW: 13.2 % (ref 11.2–16.1)
WBC Count: 7.2 10*3/uL (ref 3.9–10.3)

## 2017-07-05 LAB — CMP (CANCER CENTER ONLY)
ALBUMIN: 3.2 g/dL — AB (ref 3.5–5.0)
ALT: 24 U/L (ref 0–55)
AST: 67 U/L — AB (ref 5–34)
Alkaline Phosphatase: 124 U/L (ref 40–150)
Anion gap: 12 — ABNORMAL HIGH (ref 3–11)
BILIRUBIN TOTAL: 0.7 mg/dL (ref 0.2–1.2)
BUN: 7 mg/dL (ref 7–26)
CO2: 25 mmol/L (ref 22–29)
Calcium: 9.8 mg/dL (ref 8.4–10.4)
Chloride: 104 mmol/L (ref 98–109)
Creatinine: 0.83 mg/dL (ref 0.60–1.10)
GFR, Est AFR Am: >60 mL/min (ref >60)
GFR, Estimated: >60 mL/min (ref >60)
GLUCOSE: 92 mg/dL (ref 70–140)
POTASSIUM: 3.5 mmol/L (ref 3.3–4.7)
Sodium: 141 mmol/L (ref 136–145)
TOTAL PROTEIN: 7.6 g/dL (ref 6.4–8.3)

## 2017-07-05 MED ORDER — HEPARIN SOD (PORK) LOCK FLUSH 100 UNIT/ML IV SOLN
500.0000 [IU] | Freq: Once | INTRAVENOUS | Status: AC
  Administered 2017-07-05: 500 [IU]
  Filled 2017-07-05: qty 5

## 2017-07-05 MED ORDER — SODIUM CHLORIDE 0.9% FLUSH
10.0000 mL | Freq: Once | INTRAVENOUS | Status: AC
  Administered 2017-07-05: 10 mL
  Filled 2017-07-05: qty 10

## 2017-07-19 ENCOUNTER — Other Ambulatory Visit: Payer: Self-pay

## 2017-07-19 DIAGNOSIS — C50212 Malignant neoplasm of upper-inner quadrant of left female breast: Secondary | ICD-10-CM

## 2017-07-19 DIAGNOSIS — Z170 Estrogen receptor positive status [ER+]: Principal | ICD-10-CM

## 2017-07-20 ENCOUNTER — Inpatient Hospital Stay: Payer: Medicare Other

## 2017-07-20 ENCOUNTER — Inpatient Hospital Stay: Payer: Medicare Other | Attending: Oncology

## 2017-07-20 ENCOUNTER — Other Ambulatory Visit: Payer: Self-pay | Admitting: Oncology

## 2017-07-20 VITALS — BP 141/65 | HR 88 | Temp 98.5°F | Resp 18

## 2017-07-20 DIAGNOSIS — Z794 Long term (current) use of insulin: Secondary | ICD-10-CM | POA: Insufficient documentation

## 2017-07-20 DIAGNOSIS — Z803 Family history of malignant neoplasm of breast: Secondary | ICD-10-CM | POA: Diagnosis not present

## 2017-07-20 DIAGNOSIS — Z170 Estrogen receptor positive status [ER+]: Secondary | ICD-10-CM | POA: Insufficient documentation

## 2017-07-20 DIAGNOSIS — C50212 Malignant neoplasm of upper-inner quadrant of left female breast: Secondary | ICD-10-CM

## 2017-07-20 DIAGNOSIS — E109 Type 1 diabetes mellitus without complications: Secondary | ICD-10-CM | POA: Insufficient documentation

## 2017-07-20 DIAGNOSIS — Z5112 Encounter for antineoplastic immunotherapy: Secondary | ICD-10-CM | POA: Diagnosis not present

## 2017-07-20 DIAGNOSIS — Z853 Personal history of malignant neoplasm of breast: Secondary | ICD-10-CM | POA: Insufficient documentation

## 2017-07-20 DIAGNOSIS — K56609 Unspecified intestinal obstruction, unspecified as to partial versus complete obstruction: Secondary | ICD-10-CM | POA: Diagnosis not present

## 2017-07-20 DIAGNOSIS — Z95828 Presence of other vascular implants and grafts: Secondary | ICD-10-CM

## 2017-07-20 LAB — CBC WITH DIFFERENTIAL (CANCER CENTER ONLY)
BASOS PCT: 1 %
Basophils Absolute: 0 10*3/uL (ref 0.0–0.1)
EOS ABS: 0.2 10*3/uL (ref 0.0–0.5)
EOS PCT: 2 %
HCT: 38.4 % (ref 34.8–46.6)
HEMOGLOBIN: 12.3 g/dL (ref 11.6–15.9)
LYMPHS ABS: 2.3 10*3/uL (ref 0.9–3.3)
Lymphocytes Relative: 36 %
MCH: 29.5 pg (ref 25.1–34.0)
MCHC: 32 g/dL (ref 31.5–36.0)
MCV: 92.1 fL (ref 79.5–101.0)
MONOS PCT: 8 %
Monocytes Absolute: 0.6 10*3/uL (ref 0.1–0.9)
NEUTROS PCT: 53 %
Neutro Abs: 3.5 10*3/uL (ref 1.5–6.5)
PLATELETS: 341 10*3/uL (ref 145–400)
RBC: 4.17 MIL/uL (ref 3.70–5.45)
RDW: 13.6 % (ref 11.2–14.5)
WBC Count: 6.5 10*3/uL (ref 3.9–10.3)

## 2017-07-20 LAB — CMP (CANCER CENTER ONLY)
ALBUMIN: 3.1 g/dL — AB (ref 3.5–5.0)
ALK PHOS: 118 U/L (ref 40–150)
ALT: 14 U/L (ref 0–55)
ANION GAP: 10 (ref 3–11)
AST: 26 U/L (ref 5–34)
BUN: 10 mg/dL (ref 7–26)
CALCIUM: 9.1 mg/dL (ref 8.4–10.4)
CHLORIDE: 104 mmol/L (ref 98–109)
CO2: 27 mmol/L (ref 22–29)
Creatinine: 0.83 mg/dL (ref 0.60–1.10)
GFR, Est AFR Am: >60 mL/min (ref >60)
GFR, Estimated: >60 mL/min (ref >60)
GLUCOSE: 140 mg/dL (ref 70–140)
POTASSIUM: 3.5 mmol/L (ref 3.5–5.1)
SODIUM: 141 mmol/L (ref 136–145)
Total Bilirubin: 0.6 mg/dL (ref 0.2–1.2)
Total Protein: 7.6 g/dL (ref 6.4–8.3)

## 2017-07-20 MED ORDER — SODIUM CHLORIDE 0.9% FLUSH
10.0000 mL | INTRAVENOUS | Status: DC | PRN
  Administered 2017-07-20: 10 mL
  Filled 2017-07-20: qty 10

## 2017-07-20 MED ORDER — ADO-TRASTUZUMAB EMTANSINE CHEMO INJECTION 160 MG
3.6000 mg/kg | Freq: Once | INTRAVENOUS | Status: AC
  Administered 2017-07-20: 400 mg via INTRAVENOUS
  Filled 2017-07-20: qty 4

## 2017-07-20 MED ORDER — HEPARIN SOD (PORK) LOCK FLUSH 100 UNIT/ML IV SOLN
500.0000 [IU] | Freq: Once | INTRAVENOUS | Status: AC | PRN
  Administered 2017-07-20: 500 [IU]
  Filled 2017-07-20: qty 5

## 2017-07-20 MED ORDER — DIPHENHYDRAMINE HCL 25 MG PO CAPS
ORAL_CAPSULE | ORAL | Status: AC
  Filled 2017-07-20: qty 1

## 2017-07-20 MED ORDER — SODIUM CHLORIDE 0.9% FLUSH
10.0000 mL | Freq: Once | INTRAVENOUS | Status: AC
  Administered 2017-07-20: 10 mL
  Filled 2017-07-20: qty 10

## 2017-07-20 MED ORDER — SODIUM CHLORIDE 0.9 % IV SOLN
Freq: Once | INTRAVENOUS | Status: AC
  Administered 2017-07-20: 10:00:00 via INTRAVENOUS

## 2017-07-20 MED ORDER — ACETAMINOPHEN 325 MG PO TABS
650.0000 mg | ORAL_TABLET | Freq: Once | ORAL | Status: AC
  Administered 2017-07-20: 650 mg via ORAL

## 2017-07-20 MED ORDER — ACETAMINOPHEN 325 MG PO TABS
ORAL_TABLET | ORAL | Status: AC
  Filled 2017-07-20: qty 2

## 2017-07-20 MED ORDER — DIPHENHYDRAMINE HCL 25 MG PO CAPS
25.0000 mg | ORAL_CAPSULE | Freq: Once | ORAL | Status: AC
  Administered 2017-07-20: 25 mg via ORAL

## 2017-07-20 NOTE — Patient Instructions (Signed)
[facility] Discharge Instructions for Patients Receiving Chemotherapy  Today you received the following chemotherapy agent: Kadcyla  To help prevent nausea and vomiting after your treatment, we encourage you to take your nausea medication as directed  If you develop nausea and vomiting that is not controlled by your nausea medication, call the clinic.   BELOW ARE SYMPTOMS THAT SHOULD BE REPORTED IMMEDIATELY:  *FEVER GREATER THAN 100.5 F  *CHILLS WITH OR WITHOUT FEVER  NAUSEA AND VOMITING THAT IS NOT CONTROLLED WITH YOUR NAUSEA MEDICATION  *UNUSUAL SHORTNESS OF BREATH  *UNUSUAL BRUISING OR BLEEDING  TENDERNESS IN MOUTH AND THROAT WITH OR WITHOUT PRESENCE OF ULCERS  *URINARY PROBLEMS  *BOWEL PROBLEMS  UNUSUAL RASH Items with * indicate a potential emergency and should be followed up as soon as possible.  Feel free to call the clinic should you have any questions or concerns. The clinic phone number is [phone].  Please show the [place] at check-in to the Emergency Department and triage nurse.

## 2017-07-20 NOTE — Progress Notes (Signed)
Tolerated kadcyla well.  VS stable.  Stayed for 30 min post observation period.

## 2017-07-26 ENCOUNTER — Telehealth (HOSPITAL_COMMUNITY): Payer: Self-pay | Admitting: Vascular Surgery

## 2017-07-26 NOTE — Telephone Encounter (Signed)
Left pt message to make f/u appt w/ in APRIL

## 2017-07-30 ENCOUNTER — Telehealth: Payer: Self-pay

## 2017-07-30 NOTE — Telephone Encounter (Signed)
Received VM from pt regarding having pain in left breast for the last 3-4 days and what can she take and has had some ongoing darkened areas .  Called pt and she denies any redness, swelling, warmth, or drainage.  Explained to pt as the tumor in left breast shrinks, she may have some pain.  Suggested trying her Aleve for pain and pt said this didn't help.  Advised she can try OTC  Ibuprofen or Tylenol instead of Aleve over the weekend.  Pt said she doesn't want to try anything stronger than OTC pain medications but will call us back Monday if they didn't help or other changes are noted like redness, swelling, warmth, or drainage.  Pt voiced understanding.   Reports she didn't have a dr visit prior to last infusion and would like changes to breast recorded, made her an appt for 08-10-2017 with Mendel Ryder NP prior to her infusion on that day.

## 2017-08-09 ENCOUNTER — Other Ambulatory Visit: Payer: Self-pay | Admitting: Adult Health

## 2017-08-09 DIAGNOSIS — C50212 Malignant neoplasm of upper-inner quadrant of left female breast: Secondary | ICD-10-CM

## 2017-08-09 DIAGNOSIS — Z170 Estrogen receptor positive status [ER+]: Principal | ICD-10-CM

## 2017-08-10 ENCOUNTER — Inpatient Hospital Stay: Payer: Medicare Other

## 2017-08-10 ENCOUNTER — Encounter: Payer: Self-pay | Admitting: Adult Health

## 2017-08-10 ENCOUNTER — Inpatient Hospital Stay (HOSPITAL_BASED_OUTPATIENT_CLINIC_OR_DEPARTMENT_OTHER): Payer: Medicare Other | Admitting: Adult Health

## 2017-08-10 ENCOUNTER — Other Ambulatory Visit: Payer: Self-pay | Admitting: Adult Health

## 2017-08-10 VITALS — BP 175/76 | HR 85 | Temp 98.1°F | Resp 17 | Ht 66.5 in | Wt 245.3 lb

## 2017-08-10 DIAGNOSIS — Z170 Estrogen receptor positive status [ER+]: Principal | ICD-10-CM

## 2017-08-10 DIAGNOSIS — Z803 Family history of malignant neoplasm of breast: Secondary | ICD-10-CM | POA: Diagnosis not present

## 2017-08-10 DIAGNOSIS — K56609 Unspecified intestinal obstruction, unspecified as to partial versus complete obstruction: Secondary | ICD-10-CM

## 2017-08-10 DIAGNOSIS — E109 Type 1 diabetes mellitus without complications: Secondary | ICD-10-CM

## 2017-08-10 DIAGNOSIS — Z853 Personal history of malignant neoplasm of breast: Secondary | ICD-10-CM | POA: Diagnosis not present

## 2017-08-10 DIAGNOSIS — C50212 Malignant neoplasm of upper-inner quadrant of left female breast: Secondary | ICD-10-CM

## 2017-08-10 DIAGNOSIS — R8290 Unspecified abnormal findings in urine: Secondary | ICD-10-CM

## 2017-08-10 DIAGNOSIS — Z794 Long term (current) use of insulin: Secondary | ICD-10-CM | POA: Diagnosis not present

## 2017-08-10 DIAGNOSIS — Z95828 Presence of other vascular implants and grafts: Secondary | ICD-10-CM

## 2017-08-10 DIAGNOSIS — Z5112 Encounter for antineoplastic immunotherapy: Secondary | ICD-10-CM | POA: Diagnosis not present

## 2017-08-10 LAB — CBC WITH DIFFERENTIAL (CANCER CENTER ONLY)
BASOS PCT: 1 %
Basophils Absolute: 0.1 10*3/uL (ref 0.0–0.1)
EOS ABS: 0.2 10*3/uL (ref 0.0–0.5)
Eosinophils Relative: 3 %
HCT: 38.8 % (ref 34.8–46.6)
HEMOGLOBIN: 12.3 g/dL (ref 11.6–15.9)
LYMPHS ABS: 2.1 10*3/uL (ref 0.9–3.3)
Lymphocytes Relative: 37 %
MCH: 29.1 pg (ref 25.1–34.0)
MCHC: 31.7 g/dL (ref 31.5–36.0)
MCV: 91.7 fL (ref 79.5–101.0)
Monocytes Absolute: 0.5 10*3/uL (ref 0.1–0.9)
Monocytes Relative: 8 %
Neutro Abs: 2.8 10*3/uL (ref 1.5–6.5)
Neutrophils Relative %: 51 %
Platelet Count: 301 10*3/uL (ref 145–400)
RBC: 4.23 MIL/uL (ref 3.70–5.45)
RDW: 14.1 % (ref 11.2–14.5)
WBC Count: 5.6 10*3/uL (ref 3.9–10.3)

## 2017-08-10 LAB — URINALYSIS, COMPLETE (UACMP) WITH MICROSCOPIC
BILIRUBIN URINE: NEGATIVE
GLUCOSE, UA: NEGATIVE mg/dL
HGB URINE DIPSTICK: NEGATIVE
KETONES UR: NEGATIVE mg/dL
NITRITE: NEGATIVE
PROTEIN: NEGATIVE mg/dL
Specific Gravity, Urine: 1.01 (ref 1.005–1.030)
pH: 6.5 (ref 5.0–8.0)

## 2017-08-10 LAB — CMP (CANCER CENTER ONLY)
ALT: 22 U/L (ref 0–55)
AST: 38 U/L — ABNORMAL HIGH (ref 5–34)
Albumin: 3.2 g/dL — ABNORMAL LOW (ref 3.5–5.0)
Alkaline Phosphatase: 116 U/L (ref 40–150)
Anion gap: 11 (ref 3–11)
BUN: 9 mg/dL (ref 7–26)
CHLORIDE: 105 mmol/L (ref 98–109)
CO2: 25 mmol/L (ref 22–29)
CREATININE: 0.75 mg/dL (ref 0.60–1.10)
Calcium: 9.7 mg/dL (ref 8.4–10.4)
GFR, Estimated: >60 mL/min (ref >60)
Glucose, Bld: 93 mg/dL (ref 70–140)
Potassium: 3.6 mmol/L (ref 3.5–5.1)
Sodium: 141 mmol/L (ref 136–145)
Total Bilirubin: 0.8 mg/dL (ref 0.2–1.2)
Total Protein: 7.7 g/dL (ref 6.4–8.3)

## 2017-08-10 MED ORDER — DIPHENHYDRAMINE HCL 25 MG PO CAPS
ORAL_CAPSULE | ORAL | Status: AC
  Filled 2017-08-10: qty 1

## 2017-08-10 MED ORDER — HEPARIN SOD (PORK) LOCK FLUSH 100 UNIT/ML IV SOLN
500.0000 [IU] | Freq: Once | INTRAVENOUS | Status: AC | PRN
  Administered 2017-08-10: 500 [IU]
  Filled 2017-08-10: qty 5

## 2017-08-10 MED ORDER — DIPHENHYDRAMINE HCL 25 MG PO CAPS
25.0000 mg | ORAL_CAPSULE | Freq: Once | ORAL | Status: AC
  Administered 2017-08-10: 25 mg via ORAL

## 2017-08-10 MED ORDER — ACETAMINOPHEN 325 MG PO TABS
650.0000 mg | ORAL_TABLET | Freq: Once | ORAL | Status: AC
  Administered 2017-08-10: 650 mg via ORAL

## 2017-08-10 MED ORDER — ACETAMINOPHEN 325 MG PO TABS
ORAL_TABLET | ORAL | Status: AC
  Filled 2017-08-10: qty 2

## 2017-08-10 MED ORDER — SODIUM CHLORIDE 0.9% FLUSH
10.0000 mL | INTRAVENOUS | Status: DC | PRN
  Administered 2017-08-10: 10 mL
  Filled 2017-08-10: qty 10

## 2017-08-10 MED ORDER — SODIUM CHLORIDE 0.9% FLUSH
10.0000 mL | Freq: Once | INTRAVENOUS | Status: AC
  Administered 2017-08-10: 10 mL
  Filled 2017-08-10: qty 10

## 2017-08-10 MED ORDER — SODIUM CHLORIDE 0.9 % IV SOLN
3.6000 mg/kg | Freq: Once | INTRAVENOUS | Status: AC
  Administered 2017-08-10: 400 mg via INTRAVENOUS
  Filled 2017-08-10: qty 20

## 2017-08-10 MED ORDER — SODIUM CHLORIDE 0.9 % IV SOLN
Freq: Once | INTRAVENOUS | Status: AC
  Administered 2017-08-10: 10:00:00 via INTRAVENOUS

## 2017-08-10 NOTE — Progress Notes (Signed)
[place]  Telephone:(336) [phone] [phone]     ID: Tracy Howell DOB: June 21, 1945  MR#: [id number]  STM#:[id number]  Patient Care Team: Tracy Lei, Howell as PCP - General (Family Medicine) Tracy Overall, Howell as Consulting Physician (General Surgery) Magrinat, Virgie Dad, Howell as Consulting Physician (Oncology) Tracy Gibson, Howell as Attending Physician (Radiation Oncology) Tracy Craver, Howell as Consulting Physician (Gastroenterology) Tracy Howell:  CHIEF COMPLAINT: triple positive breast cancer  CURRENT TREATMENT: T-DM1   HISTORY OF CURRENT ILLNESS: From the original intake note:  "Tracy Howell" tells me she underwent left lumpectomy in [place] on 2012 for a 2.5 cm, grade 2 breast cancer involving one lymph node of 5 sampled. [ It may have been only 2 lymph nodes that were removed she says.]  This was at the [facility] and [facility], currently the [place] [facility] on [address].  The patient says she was told could have more treatment if she wanted it.  She did not see any sense in it.  She feels her left breast cancer was caused by the fact that she kept her iPhone in her bra right by the left breast.  Sometime in April 2018 she noted a new mass in the left breast. She did not immediately bring it to medical attention but as it continued to grow she mentioned her to her primary care physician and on 03/29/2017 Tracy Howell underwent bilateral diagnostic mammography with tomography and left breast ultrasonography at [facility].  The breast density was category B.  In the left breast central to the nipple there was a 6 cm irregular mass with indistinct margins and heterogeneous calcifications.  By ultrasound this measured 5 cm, with indistinct margins, at the 12:00 anterior area.  There was associated edema.  The left axilla was sonographically benign.  Biopsy of the left breast area in question March 30, 2017 showed ([id number]) invasive  ductal carcinoma, grade 3, estrogen receptor 100% positive, progesterone receptor 60% positive, both with strong staining intensity, HER-2 amplified, with a signals ratio of 3.38, and the number per cell 7.95.  The  MIB-1 was 70%.  The patient's subsequent history is as detailed below.  INTERVAL HISTORY: Tracy Howell returns today for further evaluation and treatment of her triple positive breast cancer accompanied by her husband.She is due for her third cycle of TDM1 that she receives every three weeks neo adjuvantly.     REVIEW OF SYSTEMS: Tracy Howell says that she has a pain in her right side, but that it is worse if she doesn't drink enough water.  Her urine is darker. She is drinking about 40 ounces of water per day.  She notes her breast is getting softer, and the pain is less intense.  She has noted some dry blood tined nasal drainage in the morning, along with some blood tinged saliva in the morning.  Once she is up and gets ready including her oral hygiene, these issues resolve.  She denies any ulcerations or active bleeding in her gums or oral mucosa.  She is anxious to get surgery scheduled.  She really wants to pinpoint an exact time.   Tracy Howell denies fevers, chills, nausea, vomiting, constipation, diarrhea, numbness/tingling, headaches, vision change, dyshphagia, chest pain, shortness of breath, DOE, orthopnea, difficulty sleeping, lower extremity swelling.  A detailed ROS was conducted and is non contributory today.     PAST MEDICAL HISTORY: Past Medical History:  Diagnosis Date  . Angioedema   . Breast cancer ([place]) 2013  .  Complication of anesthesia    slow to wake up   . Pneumonia    hx of x 2   . recurrent left breast ca dx'd 04/2017  . SBO (small bowel obstruction) ([place]) 08/13/2014    PAST SURGICAL HISTORY: Past Surgical History:  Procedure Laterality Date  . ABDOMINAL HYSTERECTOMY    . APPENDECTOMY    . BREAST LUMPECTOMY  2013  . BREAST SURGERY    . CESAREAN SECTION       x2  . CHOLECYSTECTOMY    . PORTACATH PLACEMENT Right 05/17/2017   Procedure: INSERTION PORT-A-CATH right subclavian vein;  Surgeon: Tracy Overall, Howell;  Location: [facility];  Service: General;  Laterality: Right;    FAMILY HISTORY Family History  Problem Relation Age of Onset  . Angioedema Mother   . Breast cancer Mother   . Breast cancer Maternal Aunt   The patient was adopted.  Her adoptive parents died in their 34s.  The patient's birth mother however died in her 88s from breast cancer.  The patient believes she has 2 brothers is not sure about sisters.  However she does have 2 maternal aunts who had breast cancer.  There is no history of ovarian cancer in the family to her knowledge.  The patient has no information regarding her father or his side of the  GYNECOLOGIC HISTORY:  No LMP recorded. Patient has had a hysterectomy. Menarche age 36, first live birth age 36, she is [place] P2.  She underwent hysterectomy at age 63.  She tells me they left a fourth of an ovary at that time.  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  She actually lives in [place], [place], and spends 2 weeks here every 2 months at her job.  When she is in town she actually lives with Tracy Howell. 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  She recently married Tracy Howell who is a Camera operator.  He has 2 children of his own, in [place] and [place].  The patient's own children are Tracy Howell who lives in [place] and works in Engineer, technical sales, and [employer] lives in [place] and owns a Reader.  The patient has no grandchildren.  She is 1/7-day [facility].  (She tells me her religion has no bands on any medical treatments but she cannot have alcohol, cigarettes, or pork).   ADVANCED DIRECTIVES:    HEALTH  MAINTENANCE: Social History   Tobacco Use  . Smoking status: Never Smoker  . Smokeless tobacco: Never Used  Substance Use Topics  . Alcohol use: No  . Drug use: No     Colonoscopy:  PAP:  Bone density:   Allergies  Allergen Reactions  . Onion Anaphylaxis, Nausea And Vomiting and Swelling    Throat swelling.  . Tracy Anaphylaxis and Nausea And Vomiting    Green Peppers-anaphylactic, nausea/vomiting/swelling  Mushrooms-nausea/vomiting.  Patient has hereditary angioedema (HAE)  . Codeine Nausea And Vomiting  . Morphine And Related Nausea And Vomiting  . Penicillins Rash    Vomiting Has patient had a PCN reaction causing immediate rash, facial/tongue/throat swelling, SOB or lightheadedness with hypotension: Yes Has patient had a PCN reaction causing severe rash involving mucus membranes or skin necrosis: Yes Has patient had a PCN reaction that required hospitalization:Was inpatient when reaction occurred. Has  patient had a PCN reaction occurring within the last 10 years: No If all of the above answers are "NO", then may proceed with Cephalosporin use.     Current Outpatient Medications  Medication Sig Dispense Refill  . Ascorbic Acid (VITAMIN C PO) Take 1 tablet by mouth daily.    . B Complex-C (B-COMPLEX WITH VITAMIN C) tablet Take 1 tablet by mouth daily.    . COD LIVER OIL PO Take 1 capsule by mouth daily.    Marland Kitchen lidocaine-prilocaine (EMLA) cream Apply 1 application topically as needed. 30 g 0  . Multiple Vitamin (MULTIVITAMIN WITH MINERALS) TABS tablet Take 1 tablet by mouth daily.    . naproxen sodium (ALEVE) 220 MG tablet Take 440 mg by mouth 3 (three) times daily as needed (for pain (gelcaps)).      No current facility-administered medications for this visit.    Facility-Administered Medications Ordered in Tracy Visits  Medication Dose Route Frequency Provider Last Rate Last Dose  . ado-trastuzumab emtansine (KADCYLA) 400 mg in sodium chloride 0.9 % 250 mL chemo  infusion  3.6 mg/kg (Treatment Plan Recorded) Intravenous Once Magrinat, Virgie Dad, Howell      . heparin lock flush 100 unit/mL  500 Units Intracatheter Once PRN Magrinat, Virgie Dad, Howell      . sodium chloride flush (NS) 0.9 % injection 10 mL  10 mL Intracatheter PRN Magrinat, Virgie Dad, Howell        OBJECTIVE:   Vitals:   08/10/17 0853  BP: (!) 175/76  Pulse: 85  Resp: 17  Temp: 98.1 F (36.7 C)  SpO2: 99%     Body mass index is 39 kg/m.   Wt Readings from Last 3 Encounters:  08/10/17 245 lb 4.8 oz (111.3 kg)  06/29/17 248 lb 6.4 oz (112.7 kg)  06/24/17 250 lb 8 oz (113.6 kg)  BP 160/72 on recheck manually  ECOG FS:1 - Symptomatic but completely ambulatory GENERAL: Patient is a well appearing older obese woman in no acute distress HEENT:  Sclerae anicteric.  Oropharynx clear and moist. No ulcerations or evidence of oropharyngeal candidiasis. Neck is supple.  NODES:  No cervical, supraclavicular, or axillary lymphadenopathy palpated.  BREAST EXAM:  Deferred. LUNGS:  Clear to auscultation bilaterally.  No wheezes or rhonchi. HEART:  Regular rate and rhythm. No murmur appreciated. ABDOMEN:  Soft, nontender.  Positive, normoactive bowel sounds. No organomegaly palpated. Neg CVA tenderness. MSK:  No focal spinal tenderness to palpation. Full range of motion bilaterally in the upper extremities. EXTREMITIES:  No peripheral edema.   SKIN:  Clear with no obvious rashes or skin changes. No nail dyscrasia. NEURO:  Nonfocal. Well oriented.  Appropriate affect.            LAB RESULTS:  CMP     Component Value Date/Time   NA 141 08/10/2017 0813   NA 142 05/12/2017 0808   K 3.6 08/10/2017 0813   K 3.5 05/12/2017 0808   CL 105 08/10/2017 0813   CO2 25 08/10/2017 0813   CO2 26 05/12/2017 0808   GLUCOSE 93 08/10/2017 0813   GLUCOSE 133 05/12/2017 0808   BUN 9 08/10/2017 0813   BUN 9.8 05/12/2017 0808   CREATININE 0.75 08/10/2017 0813   CREATININE 0.9 05/12/2017 0808   CALCIUM  9.7 08/10/2017 0813   CALCIUM 9.3 05/12/2017 0808   PROT 7.7 08/10/2017 0813   PROT 7.3 05/12/2017 0808   ALBUMIN 3.2 (L) 08/10/2017 0813   ALBUMIN 3.3 (L) 05/12/2017 9211  AST 38 (H) 08/10/2017 0813   AST 15 05/12/2017 0808   ALT 22 08/10/2017 0813   ALT 10 05/12/2017 0808   ALKPHOS 116 08/10/2017 0813   ALKPHOS 90 05/12/2017 0808   BILITOT 0.8 08/10/2017 0813   BILITOT 0.56 05/12/2017 0808   GFRNONAA >60 08/10/2017 0813   GFRAA >60 08/10/2017 0813    No results found for: Ronnald Ramp, A1GS, A2GS, BETS, BETA2SER, GAMS, MSPIKE, SPEI  No results found for: Nils Pyle, [facility]  Lab Results  Component Value Date   WBC 5.6 08/10/2017   NEUTROABS 2.8 08/10/2017   HGB 13.1 05/12/2017   HCT 38.8 08/10/2017   MCV 91.7 08/10/2017   PLT 301 08/10/2017      Chemistry      Component Value Date/Time   NA 141 08/10/2017 0813   NA 142 05/12/2017 0808   K 3.6 08/10/2017 0813   K 3.5 05/12/2017 0808   CL 105 08/10/2017 0813   CO2 25 08/10/2017 0813   CO2 26 05/12/2017 0808   BUN 9 08/10/2017 0813   BUN 9.8 05/12/2017 0808   CREATININE 0.75 08/10/2017 0813   CREATININE 0.9 05/12/2017 0808      Component Value Date/Time   CALCIUM 9.7 08/10/2017 0813   CALCIUM 9.3 05/12/2017 0808   ALKPHOS 116 08/10/2017 0813   ALKPHOS 90 05/12/2017 0808   AST 38 (H) 08/10/2017 0813   AST 15 05/12/2017 0808   ALT 22 08/10/2017 0813   ALT 10 05/12/2017 0808   BILITOT 0.8 08/10/2017 0813   BILITOT 0.56 05/12/2017 0808       No results found for: LABCA2  No components found for: XVQMGQ676  No results for input(s): INR in the last 168 hours.  No results found for: LABCA2  No results found for: PPJ093  No results found for: OIZ124  No results found for: PYK998  No results found for: CA2729  No components found for: HGQUANT  No results found for: CEA1 / No results found for: CEA1   No results found for: AFPTUMOR  No results found for:  CHROMOGRNA  No results found for: PSA1  Appointment on 08/10/2017  Component Date Value Ref Range Status  . Sodium 08/10/2017 141  136 - 145 mmol/L Final  . Potassium 08/10/2017 3.6  3.5 - 5.1 mmol/L Final  . Chloride 08/10/2017 105  98 - 109 mmol/L Final  . CO2 08/10/2017 25  22 - 29 mmol/L Final  . Glucose, Bld 08/10/2017 93  70 - 140 mg/dL Final  . BUN 08/10/2017 9  7 - 26 mg/dL Final  . Creatinine 08/10/2017 0.75  0.60 - 1.10 mg/dL Final  . Calcium 08/10/2017 9.7  8.4 - 10.4 mg/dL Final  . Total Protein 08/10/2017 7.7  6.4 - 8.3 g/dL Final  . Albumin 08/10/2017 3.2* 3.5 - 5.0 g/dL Final  . AST 08/10/2017 38* 5 - 34 U/L Final  . ALT 08/10/2017 22  0 - 55 U/L Final  . Alkaline Phosphatase 08/10/2017 116  40 - 150 U/L Final  . Total Bilirubin 08/10/2017 0.8  0.2 - 1.2 mg/dL Final  . GFR, Est Non Af Am 08/10/2017 >60  >60 mL/min Final  . GFR, Est AFR Am 08/10/2017 >60  >60 mL/min Final   Comment: (NOTE) The eGFR has been calculated using the CKD EPI equation. This calculation has not been validated in all clinical situations. eGFR's persistently <60 mL/min signify possible Chronic Kidney Disease.   . Anion gap 08/10/2017 11  3 -  11 Final   Performed at [facility], [place] [address], [place], [place] [address]  . WBC Count 08/10/2017 5.6  3.9 - 10.3 K/uL Final  . RBC 08/10/2017 4.23  3.70 - 5.45 MIL/uL Final  . Hemoglobin 08/10/2017 12.3  11.6 - 15.9 g/dL Final  . HCT 08/10/2017 38.8  34.8 - 46.6 % Final  . MCV 08/10/2017 91.7  79.5 - 101.0 fL Final  . MCH 08/10/2017 29.1  25.1 - 34.0 pg Final  . MCHC 08/10/2017 31.7  31.5 - 36.0 g/dL Final  . RDW 08/10/2017 14.1  11.2 - 14.5 % Final  . Platelet Count 08/10/2017 301  145 - 400 K/uL Final  . Neutrophils Relative % 08/10/2017 51  % Final  . Neutro Abs 08/10/2017 2.8  1.5 - 6.5 K/uL Final  . Lymphocytes Relative 08/10/2017 37  % Final  . Lymphs Abs 08/10/2017 2.1  0.9 - 3.3 K/uL Final  . Monocytes  Relative 08/10/2017 8  % Final  . Monocytes Absolute 08/10/2017 0.5  0.1 - 0.9 K/uL Final  . Eosinophils Relative 08/10/2017 3  % Final  . Eosinophils Absolute 08/10/2017 0.2  0.0 - 0.5 K/uL Final  . Basophils Relative 08/10/2017 1  % Final  . Basophils Absolute 08/10/2017 0.1  0.0 - 0.1 K/uL Final   Performed at [facility], [place] [address] [place], [place] [address]    (this displays the last labs from the last 3 days)  No results found for: TOTALPROTELP, ALBUMINELP, A1GS, A2GS, BETS, BETA2SER, GAMS, MSPIKE, SPEI (this displays SPEP labs)  No results found for: KPAFRELGTCHN, LAMBDASER, KAPLAMBRATIO (kappa/lambda light chains)  No results found for: HGBA, HGBA2QUANT, HGBFQUANT, HGBSQUAN (Hemoglobinopathy evaluation)   No results found for: LDH  No results found for: IRON, TIBC, IRONPCTSAT (Iron and TIBC)  No results found for: FERRITIN  Urinalysis No results found for: COLORURINE, APPEARANCEUR, LABSPEC, PHURINE, GLUCOSEU, HGBUR, BILIRUBINUR, KETONESUR, PROTEINUR, UROBILINOGEN, NITRITE, LEUKOCYTESUR   STUDIES: I discussed the recent biopsy with the patient and gave her a copy of the pathology  ELIGIBLE FOR AVAILABLE RESEARCH PROTOCOL:no  ASSESSMENT: 72 y.o. [place] woman (primarily residing in [place])  (1) status post left lumpectomy in 2012 for a reported pT2 pN1 breast cancer, the patient refusing adjuvant treatment (no chemotherapy, radiation, or antiestrogens).  METASTATIC DISEASE: December 2018 (2) status post left breast biopsy 03/30/2017 for a clinical T4 N0, stage IIIB invasive ductal carcinoma, grade 3, triple positive, with an MIB-1 of 70%  (a) staging studies 05/21/2017 show multiple bilateral pulmonary nodules consistent with stage IV disease 05/21/2017; there are no liver or bone lesions noted  (b) breast MRI 05/13/2017 shows a 7 cm area of non-masslike enhancement in the right breast, with biopsy ([id number]) on 06/24/2017 showing:  Metastatic carcinoma. Prognostic panel requested 01/15  (3) neoadjuvant chemotherapy with T-DM1 started 06/29/2016  (a) echocardiogram 06/24/2017 showed an ejection fraction of 60-65 %  (4) definitive surgery to follow depending on response: Patient desires bilateral mastectomies  (5) adjuvant radiation as appropriate  (6) anti estrogens after optimization of local therapy  (7) genetics testing  PLAN:  Alayla is doing well today.  I reviewed her CBC which was the only lab back during our appointment time with her.  It was normal.  She will proceed with treatment today.  I again reviewed Dr. Virgie Howell plan for her chemo with her.  Per his last note which stated, "The plan is for TDM 1 for at least 6 cycles or  to optimal response.  At some point we will switch her over to trastuzumab alone and at that time we will move her treatments to every 4 weeks, which would be a lot more convenient for her.  At that time we will also start her on antiestrogens."  I reviewed that the decreased pain and softer breast is a good sign that things are improving.   I encouraged Johanna to increase her fluid intake today.  We will get a urinalysis to fully evaluate her flank pain. Her blood pressure is mildly elevated.  She is unsure why that is.  She has a home BP cuff and she will check it at home.  She does have upcoming appointment with cardiology.    I reviewed her plan with Dr. Jana Hakim.  He wants to see her with every Tracy treatment.  She will return in 3 weeks for labs and TDM1, and in 6 weeks for labs, f/u with Dr. Jana Hakim, and TDM1.  She knows to call for any issues that may develop before her next visit.  A total of (30) minutes of face-to-face time was spent with this patient with greater than 50% of that time in counseling and care-coordination.  Wilber Bihari, NP  08/10/17 10:01 AM Medical Oncology and Hematology [facility] [address] [place], [place] [address] Tel.  [phone]    Fax. [phone]

## 2017-08-11 ENCOUNTER — Telehealth: Payer: Self-pay

## 2017-08-11 ENCOUNTER — Telehealth: Payer: Self-pay | Admitting: Adult Health

## 2017-08-11 NOTE — Telephone Encounter (Signed)
Returned pt's call regarding her upcoming appointments.  She wanted to clarify that her next appt is March 19th not March 9th and she wanted to change her April 30 th appt to May 1st.  I let her know I sent an inbasket message to scheduling to make that request.  Pt voiced understanding and no further needs at this time.

## 2017-08-11 NOTE — Telephone Encounter (Signed)
Scheduled appt per 2/26 los - left message with appt date and time.

## 2017-08-12 ENCOUNTER — Telehealth: Payer: Self-pay | Admitting: Oncology

## 2017-08-12 NOTE — Telephone Encounter (Signed)
Called regarding 5/1

## 2017-08-17 ENCOUNTER — Telehealth: Payer: Self-pay

## 2017-08-17 NOTE — Telephone Encounter (Signed)
Received VM from pt regarding she prefers appt remain on April 30th. Reviewed appts and that appt has been changed to 10-13-2017 (lab/flush/visit/infusion).  Sent inbasket message to scheduling to see if April 30th is still available and follow up with pt by phone.

## 2017-08-31 ENCOUNTER — Inpatient Hospital Stay (HOSPITAL_BASED_OUTPATIENT_CLINIC_OR_DEPARTMENT_OTHER): Payer: Medicare Other | Admitting: Medical

## 2017-08-31 ENCOUNTER — Inpatient Hospital Stay: Payer: Medicare Other

## 2017-08-31 ENCOUNTER — Inpatient Hospital Stay: Payer: Medicare Other | Attending: Oncology

## 2017-08-31 VITALS — BP 151/64 | HR 83 | Temp 98.6°F | Resp 18

## 2017-08-31 DIAGNOSIS — Z170 Estrogen receptor positive status [ER+]: Secondary | ICD-10-CM

## 2017-08-31 DIAGNOSIS — K1379 Other lesions of oral mucosa: Secondary | ICD-10-CM

## 2017-08-31 DIAGNOSIS — C50212 Malignant neoplasm of upper-inner quadrant of left female breast: Secondary | ICD-10-CM | POA: Insufficient documentation

## 2017-08-31 DIAGNOSIS — Z5112 Encounter for antineoplastic immunotherapy: Secondary | ICD-10-CM | POA: Diagnosis not present

## 2017-08-31 LAB — CMP (CANCER CENTER ONLY)
ALK PHOS: 119 U/L (ref 40–150)
ALT: 32 U/L (ref 0–55)
ANION GAP: 10 (ref 3–11)
AST: 58 U/L — ABNORMAL HIGH (ref 5–34)
Albumin: 3.1 g/dL — ABNORMAL LOW (ref 3.5–5.0)
BUN: 6 mg/dL — ABNORMAL LOW (ref 7–26)
CALCIUM: 9.5 mg/dL (ref 8.4–10.4)
CO2: 25 mmol/L (ref 22–29)
CREATININE: 0.79 mg/dL (ref 0.60–1.10)
Chloride: 106 mmol/L (ref 98–109)
Glucose, Bld: 133 mg/dL (ref 70–140)
Potassium: 3.5 mmol/L (ref 3.5–5.1)
Sodium: 141 mmol/L (ref 136–145)
Total Bilirubin: 0.6 mg/dL (ref 0.2–1.2)
Total Protein: 7.5 g/dL (ref 6.4–8.3)

## 2017-08-31 LAB — CBC WITH DIFFERENTIAL (CANCER CENTER ONLY)
BASOS ABS: 0.1 10*3/uL (ref 0.0–0.1)
BASOS PCT: 1 %
EOS ABS: 0.2 10*3/uL (ref 0.0–0.5)
Eosinophils Relative: 3 %
HCT: 36.9 % (ref 34.8–46.6)
HEMOGLOBIN: 11.9 g/dL (ref 11.6–15.9)
Lymphocytes Relative: 38 %
Lymphs Abs: 2.5 10*3/uL (ref 0.9–3.3)
MCH: 29.5 pg (ref 25.1–34.0)
MCHC: 32.2 g/dL (ref 31.5–36.0)
MCV: 91.6 fL (ref 79.5–101.0)
Monocytes Absolute: 0.6 10*3/uL (ref 0.1–0.9)
Monocytes Relative: 9 %
NEUTROS ABS: 3.2 10*3/uL (ref 1.5–6.5)
NEUTROS PCT: 49 %
Platelet Count: 284 10*3/uL (ref 145–400)
RBC: 4.03 MIL/uL (ref 3.70–5.45)
RDW: 14.5 % (ref 11.2–14.5)
WBC: 6.5 10*3/uL (ref 3.9–10.3)

## 2017-08-31 MED ORDER — DIPHENHYDRAMINE HCL 25 MG PO CAPS
25.0000 mg | ORAL_CAPSULE | Freq: Once | ORAL | Status: AC
  Administered 2017-08-31: 25 mg via ORAL

## 2017-08-31 MED ORDER — SODIUM CHLORIDE 0.9% FLUSH
10.0000 mL | INTRAVENOUS | Status: DC | PRN
  Administered 2017-08-31: 10 mL
  Filled 2017-08-31: qty 10

## 2017-08-31 MED ORDER — DIPHENHYDRAMINE HCL 25 MG PO CAPS
ORAL_CAPSULE | ORAL | Status: AC
  Filled 2017-08-31: qty 1

## 2017-08-31 MED ORDER — SODIUM CHLORIDE 0.9 % IV SOLN
Freq: Once | INTRAVENOUS | Status: AC
  Administered 2017-08-31: 09:00:00 via INTRAVENOUS

## 2017-08-31 MED ORDER — HEPARIN SOD (PORK) LOCK FLUSH 100 UNIT/ML IV SOLN
500.0000 [IU] | Freq: Once | INTRAVENOUS | Status: AC | PRN
  Administered 2017-08-31: 500 [IU]
  Filled 2017-08-31: qty 5

## 2017-08-31 MED ORDER — SODIUM CHLORIDE 0.9 % IV SOLN
3.6000 mg/kg | Freq: Once | INTRAVENOUS | Status: AC
  Administered 2017-08-31: 400 mg via INTRAVENOUS
  Filled 2017-08-31: qty 20

## 2017-08-31 MED ORDER — ACETAMINOPHEN 325 MG PO TABS
ORAL_TABLET | ORAL | Status: AC
  Filled 2017-08-31: qty 2

## 2017-08-31 MED ORDER — ACETAMINOPHEN 325 MG PO TABS
650.0000 mg | ORAL_TABLET | Freq: Once | ORAL | Status: AC
  Administered 2017-08-31: 650 mg via ORAL

## 2017-08-31 NOTE — Patient Instructions (Signed)
[facility] Discharge Instructions for Patients Receiving Chemotherapy  Today you received the following chemotherapy agents Ado-trastuzumab  To help prevent nausea and vomiting after your treatment, we encourage you to take your nausea medication as directed  If you develop nausea and vomiting that is not controlled by your nausea medication, call the clinic.   BELOW ARE SYMPTOMS THAT SHOULD BE REPORTED IMMEDIATELY:  *FEVER GREATER THAN 100.5 F  *CHILLS WITH OR WITHOUT FEVER  NAUSEA AND VOMITING THAT IS NOT CONTROLLED WITH YOUR NAUSEA MEDICATION  *UNUSUAL SHORTNESS OF BREATH  *UNUSUAL BRUISING OR BLEEDING  TENDERNESS IN MOUTH AND THROAT WITH OR WITHOUT PRESENCE OF ULCERS  *URINARY PROBLEMS  *BOWEL PROBLEMS  UNUSUAL RASH Items with * indicate a potential emergency and should be followed up as soon as possible.  Feel free to call the clinic should you have any questions or concerns. The clinic phone number is [phone].  Please show the [facility] at check-in to the Emergency Department and triage nurse.

## 2017-09-01 LAB — CANCER ANTIGEN 27.29: CA 27.29: 22 U/mL (ref 0.0–38.6)

## 2017-09-03 NOTE — Progress Notes (Signed)
[facility] Progress Note   Tracy Howell [id number] 1946/03/14 72 y.o.  Aletha Allebach is managed by Dr. Jana Hakim  Actively treated with chemotherapy: yes  Current Therapy: Kadcyla   Last Treated:  08/10/2017  Assessment: Plan:    Oral bleeding   Oral bleeding: The patient's complete blood count from today was reviewed.  Her hemoglobin was at 11.9, hematocrit 36.9, and platelet count 284.  The patient's oral exam was negative.  I reassured the patient and told her to follow-up with Dr. Jana Hakim or the [facility] should this continue or worsen.  Please see After Visit Summary for patient specific instructions.  Future Appointments  Date Time Provider [place]  09/21/2017  8:45 AM [facility] LAB 6 [facility] None  09/21/2017  9:00 AM [facility] J32 DNS [facility] None  09/21/2017  9:30 AM Magrinat, Virgie Dad, MD [facility] None  09/21/2017 10:15 AM [facility] E17 [facility] None  09/23/2017  9:00 AM [facility] ECHO 1-BUZZ [facility]  09/23/2017 10:20 AM Bensimhon, Shaune Pascal, MD [facility] None  10/12/2017  8:45 AM [facility] LAB 3 [facility] None  10/12/2017  9:00 AM [facility] J32 DNS [facility] None  10/12/2017  9:30 AM Causey, Charlestine Massed, NP [facility] None  10/12/2017 10:30 AM [facility] D12 [facility] None    No orders of the defined types were placed in this encounter.      Subjective:   Patient ID:  Tracy Howell is a 72 y.o. (DOB Apr 28, 1946) female.  Chief Complaint: No chief complaint on file.   HPI Tracy Howell was seen in the infusion room today.  She reports having blood in her mouth in the mornings when she first gets up.  She has some episodes of postnasal drainage.  She is not coughing up blood and denies epistaxis.  Her labs from today are stable with no evidence of progressive anemia or thrombocytopenia.  Medications: I have reviewed the patient's current  medications.  Allergies:  Allergies  Allergen Reactions  . Onion Anaphylaxis, Nausea And Vomiting and Swelling    Throat swelling.  . Other Anaphylaxis and Nausea And Vomiting    Green Peppers-anaphylactic, nausea/vomiting/swelling  Mushrooms-nausea/vomiting.  Patient has hereditary angioedema (HAE)  . Codeine Nausea And Vomiting  . Morphine And Related Nausea And Vomiting  . Penicillins Rash    Vomiting Has patient had a PCN reaction causing immediate rash, facial/tongue/throat swelling, SOB or lightheadedness with hypotension: Yes Has patient had a PCN reaction causing severe rash involving mucus membranes or skin necrosis: Yes Has patient had a PCN reaction that required hospitalization:Was inpatient when reaction occurred. Has patient had a PCN reaction occurring within the last 10 years: No If all of the above answers are "NO", then may proceed with Cephalosporin use.     Past Medical History:  Diagnosis Date  . Angioedema   . Breast cancer ([place]) 2013  . Complication of anesthesia    slow to wake up   . Pneumonia    hx of x 2   . recurrent left breast ca dx'd 04/2017  . SBO (small bowel obstruction) ([place]) 08/13/2014    Past Surgical History:  Procedure Laterality Date  . ABDOMINAL HYSTERECTOMY    . APPENDECTOMY    . BREAST LUMPECTOMY  2013  . BREAST SURGERY    . CESAREAN SECTION     x2  . CHOLECYSTECTOMY    . PORTACATH PLACEMENT Right 05/17/2017   Procedure: INSERTION PORT-A-CATH right subclavian vein;  Surgeon: Alphonsa Overall, MD;  Location: [facility];  Service: General;  Laterality: Right;    Family History  Problem Relation Age of Onset  . Angioedema Mother   . Breast cancer Mother   . Breast cancer Maternal Aunt     Social History   Socioeconomic History  . Marital status: Married    Spouse name: Not on file  . Number of children: Not on file  . Years of education: Not on file  . Highest education level: Not on file  Occupational History  . Not on  file  Social Needs  . Financial resource strain: Not on file  . Food insecurity:    Worry: Not on file    Inability: Not on file  . Transportation needs:    Medical: Not on file    Non-medical: Not on file  Tobacco Use  . Smoking status: Never Smoker  . Smokeless tobacco: Never Used  Substance and Sexual Activity  . Alcohol use: No  . Drug use: No  . Sexual activity: Not on file  Lifestyle  . Physical activity:    Days per week: Not on file    Minutes per session: Not on file  . Stress: Not on file  Relationships  . Social connections:    Talks on phone: Not on file    Gets together: Not on file    Attends religious service: Not on file    Active member of club or organization: Not on file    Attends meetings of clubs or organizations: Not on file    Relationship status: Not on file  . Intimate partner violence:    Fear of current or ex partner: Not on file    Emotionally abused: Not on file    Physically abused: Not on file    Forced sexual activity: Not on file  Other Topics Concern  . Not on file  Social History Narrative  . Not on file    Past Medical History, Surgical history, Social history, and Family history were reviewed and updated as appropriate.   Please see review of systems for further details on the patient's review from today.   Review of Systems:  Review of Systems  HENT: Positive for postnasal drip. Negative for mouth sores, nosebleeds, rhinorrhea, sinus pressure and sinus pain.   Respiratory: Negative for cough.     Objective:   Physical Exam:  There were no vitals taken for this visit. ECOG: 0  Physical Exam  Constitutional: No distress.  HENT:  Head: Normocephalic and atraumatic.  Mouth/Throat: Oropharynx is clear and moist. No oropharyngeal exudate.  Cardiovascular: Normal rate, regular rhythm and normal heart sounds. Exam reveals no gallop and no friction rub.  No murmur heard. Pulmonary/Chest: Breath sounds normal. No respiratory  distress. She has no wheezes. She has no rales.  Skin: She is not diaphoretic.    Lab Review:     Component Value Date/Time   NA 141 08/31/2017 0855   NA 142 05/12/2017 0808   K 3.5 08/31/2017 0855   K 3.5 05/12/2017 0808   CL 106 08/31/2017 0855   CO2 25 08/31/2017 0855   CO2 26 05/12/2017 0808   GLUCOSE 133 08/31/2017 0855   GLUCOSE 133 05/12/2017 0808   BUN 6 (L) 08/31/2017 0855   BUN 9.8 05/12/2017 0808   CREATININE 0.79 08/31/2017 0855   CREATININE 0.9 05/12/2017 0808   CALCIUM 9.5 08/31/2017 0855   CALCIUM 9.3 05/12/2017 0808   PROT 7.5 08/31/2017 0855   PROT 7.3 05/12/2017  5102   ALBUMIN 3.1 (L) 08/31/2017 0855   ALBUMIN 3.3 (L) 05/12/2017 0808   AST 58 (H) 08/31/2017 0855   AST 15 05/12/2017 0808   ALT 32 08/31/2017 0855   ALT 10 05/12/2017 0808   ALKPHOS 119 08/31/2017 0855   ALKPHOS 90 05/12/2017 0808   BILITOT 0.6 08/31/2017 0855   BILITOT 0.56 05/12/2017 0808   GFRNONAA >60 08/31/2017 0855   GFRAA >60 08/31/2017 0855       Component Value Date/Time   WBC 6.5 08/31/2017 0855   WBC 4.7 05/12/2017 0808   WBC 10.2 08/14/2014 0531   RBC 4.03 08/31/2017 0855   HGB 13.1 05/12/2017 0808   HCT 36.9 08/31/2017 0855   HCT 39.6 05/12/2017 0808   PLT 284 08/31/2017 0855   PLT 270 05/12/2017 0808   MCV 91.6 08/31/2017 0855   MCV 91.1 05/12/2017 0808   MCH 29.5 08/31/2017 0855   MCHC 32.2 08/31/2017 0855   RDW 14.5 08/31/2017 0855   RDW 13.7 05/12/2017 0808   LYMPHSABS 2.5 08/31/2017 0855   LYMPHSABS 1.9 05/12/2017 0808   MONOABS 0.6 08/31/2017 0855   MONOABS 0.4 05/12/2017 0808   EOSABS 0.2 08/31/2017 0855   EOSABS 0.2 05/12/2017 0808   BASOSABS 0.1 08/31/2017 0855   BASOSABS 0.0 05/12/2017 0808   -------------------------------  Imaging from last 24 hours (if applicable):  Radiology interpretation: No results found.

## 2017-09-10 ENCOUNTER — Encounter: Payer: Self-pay | Admitting: Oncology

## 2017-09-13 ENCOUNTER — Other Ambulatory Visit: Payer: Self-pay | Admitting: Oncology

## 2017-09-13 NOTE — Telephone Encounter (Signed)
Received VM and there is an email also from pt regarding liver enzymes being high from last visit and she wants to know if treatment needs to be adjusted? Per scheduling pt doesn't want to be scheduled for genetics testing.  Notified Dr Jana Hakim of CMP results, AST/ALT.  Per Dr Jana Hakim, I called pt back, let her know Dr Jana Hakim is aware of her results and that her lab work is always reviewed prior to treatment.  Concerning genetic testing, I explained to her why patients are referred for genetic testing and pt said she is not interested in having testing done and rather not have, unless we can find out if it is covered by insurance.  I will send an inbasket to Roma Kayser regarding pt has Medicare.  For now, pt said she doesn't want to be scheduled for genetics but said its ok for [place] to call her back with any financial info related to this. No other needs at this time per pt.

## 2017-09-13 NOTE — Progress Notes (Unsigned)
[place]  [phone]) [phone] [phone]) [phone]     ID: Tracy Howell DOB: 1945/07/08  MR#: [id number]  EHU#:[id number]  Patient Care Team: Lucianne Lei, MD as PCP - General (Family Medicine) Alphonsa Overall, MD as Consulting Physician (General Surgery) Magrinat, Virgie Dad, MD as Consulting Physician (Oncology) Eppie Gibson, MD as Attending Physician (Radiation Oncology) Juanita Craver, MD as Consulting Physician (Gastroenterology) OTHER MD:  CHIEF COMPLAINT: triple positive breast cancer  CURRENT TREATMENT: T-DM1   HISTORY OF CURRENT ILLNESS: From the original intake note:  "Tracy Howell" tells me she underwent left lumpectomy in [place] on 2012 for a 2.5 cm, grade 2 breast cancer involving one lymph node of 5 sampled. [ It may have been only 2 lymph nodes that were removed she says.]  This was at the [facility] and [facility], currently the [place] [facility] on [address].  The patient says she was told could have more treatment if she wanted it.  She did not see any sense in it.  She feels her left breast cancer was caused by the fact that she kept her iPhone in her bra right by the left breast.  Sometime in April 2018 she noted a new mass in the left breast. She did not immediately bring it to medical attention but as it continued to grow she mentioned her to her primary care physician and on 03/29/2017 Tracy Howell underwent bilateral diagnostic mammography with tomography and left breast ultrasonography at [facility].  The breast density was category B.  In the left breast central to the nipple there was a 6 cm irregular mass with indistinct margins and heterogeneous calcifications.  By ultrasound this measured 5 cm, with indistinct margins, at the 12:00 anterior area.  There was associated edema.  The left axilla was sonographically benign.  Biopsy of the left breast area in question March 30, 2017 showed ([id number]) invasive  ductal carcinoma, grade 3, estrogen receptor 100% positive, progesterone receptor 60% positive, both with strong staining intensity, HER-2 amplified, with a signals ratio of 3.38, and the number per cell 7.95.  The  MIB-1 was 70%.  The patient's subsequent history is as detailed below.  INTERVAL HISTORY: Tracy Howell returns today for further evaluation and treatment of her triple positive breast cancer accompanied by her husband.She is due for her third cycle of TDM1 that she receives every three weeks neo adjuvantly.     REVIEW OF SYSTEMS: Tracy Howell says that she has a pain in her right side, but that it is worse if she doesn't drink enough water.  Her urine is darker. She is drinking about 40 ounces of water per day.  She notes her breast is getting softer, and the pain is less intense.  She has noted some dry blood tined nasal drainage in the morning, along with some blood tinged saliva in the morning.  Once she is up and gets ready including her oral hygiene, these issues resolve.  She denies any ulcerations or active bleeding in her gums or oral mucosa.  She is anxious to get surgery scheduled.  She really wants to pinpoint an exact time.   Tracy Howell denies fevers, chills, nausea, vomiting, constipation, diarrhea, numbness/tingling, headaches, vision change, dyshphagia, chest pain, shortness of breath, DOE, orthopnea, difficulty sleeping, lower extremity swelling.  A detailed ROS was conducted and is non contributory today.     PAST MEDICAL HISTORY: Past Medical History:  Diagnosis Date  . Angioedema   . Breast cancer ([place]) 2013  .  Complication of anesthesia    slow to wake up   . Pneumonia    hx of x 2   . recurrent left breast ca dx'd 04/2017  . SBO (small bowel obstruction) ([place]) 08/13/2014    PAST SURGICAL HISTORY: Past Surgical History:  Procedure Laterality Date  . ABDOMINAL HYSTERECTOMY    . APPENDECTOMY    . BREAST LUMPECTOMY  2013  . BREAST SURGERY    . CESAREAN SECTION       x2  . CHOLECYSTECTOMY    . PORTACATH PLACEMENT Right 05/17/2017   Procedure: INSERTION PORT-A-CATH right subclavian vein;  Surgeon: Alphonsa Overall, MD;  Location: [facility];  Service: General;  Laterality: Right;    FAMILY HISTORY Family History  Problem Relation Age of Onset  . Angioedema Mother   . Breast cancer Mother   . Breast cancer Maternal Aunt   The patient was adopted.  Her adoptive parents died in their 23s.  The patient's birth mother however died in her 41s from breast cancer.  The patient believes she has 2 brothers is not sure about sisters.  However she does have 2 maternal aunts who had breast cancer.  There is no history of ovarian cancer in the family to her knowledge.  The patient has no information regarding her father or his side of the  GYNECOLOGIC HISTORY:  No LMP recorded. Patient has had a hysterectomy. Menarche age 31, first live birth age 21, she is [place] P2.  She underwent hysterectomy at age 20.  She tells me they left a fourth of an ovary at that time.  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  She actually lives in [place], [place], and spends 2 weeks here every 2 months at her job.  When she is in town she actually lives with Dr. Criss Rosales. 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  She recently married Tracy Howell who is a Camera operator.  He has 2 children of his own, in [place] and [place].  The patient's own children are Tracy Howell who lives in [place] and works in Engineer, technical sales, and [employer] lives in [place] and owns a [place].  The patient has no grandchildren.  She is 1/7-day [facility].  (She tells me her religion has no bands on any medical treatments but she cannot have alcohol, cigarettes, or pork).   ADVANCED DIRECTIVES:    HEALTH  MAINTENANCE: Social History   Tobacco Use  . Smoking status: Never Smoker  . Smokeless tobacco: Never Used  Substance Use Topics  . Alcohol use: No  . Drug use: No     Colonoscopy:  PAP:  Bone density:   Allergies  Allergen Reactions  . Onion Anaphylaxis, Nausea And Vomiting and Swelling    Throat swelling.  . Other Anaphylaxis and Nausea And Vomiting    Green Peppers-anaphylactic, nausea/vomiting/swelling  Mushrooms-nausea/vomiting.  Patient has hereditary angioedema (HAE)  . Codeine Nausea And Vomiting  . Morphine And Related Nausea And Vomiting  . Penicillins Rash    Vomiting Has patient had a PCN reaction causing immediate rash, facial/tongue/throat swelling, SOB or lightheadedness with hypotension: Yes Has patient had a PCN reaction causing severe rash involving mucus membranes or skin necrosis: Yes Has patient had a PCN reaction that required hospitalization:Was inpatient when reaction occurred. Has  patient had a PCN reaction occurring within the last 10 years: No If all of the above answers are "NO", then may proceed with Cephalosporin use.     Current Outpatient Medications  Medication Sig Dispense Refill  . Ascorbic Acid (VITAMIN C PO) Take 1 tablet by mouth daily.    . B Complex-C (B-COMPLEX WITH VITAMIN C) tablet Take 1 tablet by mouth daily.    . COD LIVER OIL PO Take 1 capsule by mouth daily.    Marland Kitchen lidocaine-prilocaine (EMLA) cream Apply 1 application topically as needed. 30 g 0  . Multiple Vitamin (MULTIVITAMIN WITH MINERALS) TABS tablet Take 1 tablet by mouth daily.    . naproxen sodium (ALEVE) 220 MG tablet Take 440 mg by mouth 3 (three) times daily as needed (for pain (gelcaps)).      No current facility-administered medications for this visit.     OBJECTIVE:   There were no vitals filed for this visit.   There is no height or weight on file to calculate BMI.   Wt Readings from Last 3 Encounters:  08/10/17 245 lb 4.8 oz (111.3 kg)  06/29/17 248 lb  6.4 oz (112.7 kg)  06/24/17 250 lb 8 oz (113.6 kg)  BP 160/72 on recheck manually  ECOG FS:1 - Symptomatic but completely ambulatory GENERAL: Patient is a well appearing older obese woman in no acute distress HEENT:  Sclerae anicteric.  Oropharynx clear and moist. No ulcerations or evidence of oropharyngeal candidiasis. Neck is supple.  NODES:  No cervical, supraclavicular, or axillary lymphadenopathy palpated.  BREAST EXAM:  Deferred. LUNGS:  Clear to auscultation bilaterally.  No wheezes or rhonchi. HEART:  Regular rate and rhythm. No murmur appreciated. ABDOMEN:  Soft, nontender.  Positive, normoactive bowel sounds. No organomegaly palpated. Neg CVA tenderness. MSK:  No focal spinal tenderness to palpation. Full range of motion bilaterally in the upper extremities. EXTREMITIES:  No peripheral edema.   SKIN:  Clear with no obvious rashes or skin changes. No nail dyscrasia. NEURO:  Nonfocal. Well oriented.  Appropriate affect.            LAB RESULTS:  CMP     Component Value Date/Time   NA 141 08/31/2017 0855   NA 142 05/12/2017 0808   K 3.5 08/31/2017 0855   K 3.5 05/12/2017 0808   CL 106 08/31/2017 0855   CO2 25 08/31/2017 0855   CO2 26 05/12/2017 0808   GLUCOSE 133 08/31/2017 0855   GLUCOSE 133 05/12/2017 0808   BUN 6 (L) 08/31/2017 0855   BUN 9.8 05/12/2017 0808   CREATININE 0.79 08/31/2017 0855   CREATININE 0.9 05/12/2017 0808   CALCIUM 9.5 08/31/2017 0855   CALCIUM 9.3 05/12/2017 0808   PROT 7.5 08/31/2017 0855   PROT 7.3 05/12/2017 0808   ALBUMIN 3.1 (L) 08/31/2017 0855   ALBUMIN 3.3 (L) 05/12/2017 0808   AST 58 (H) 08/31/2017 0855   AST 15 05/12/2017 0808   ALT 32 08/31/2017 0855   ALT 10 05/12/2017 0808   ALKPHOS 119 08/31/2017 0855   ALKPHOS 90 05/12/2017 0808   BILITOT 0.6 08/31/2017 0855   BILITOT 0.56 05/12/2017 0808   GFRNONAA >60 08/31/2017 0855   GFRAA >60 08/31/2017 0855    No results found for: TOTALPROTELP, ALBUMINELP, A1GS, A2GS,  BETS, BETA2SER, GAMS, MSPIKE, SPEI  No results found for: KPAFRELGTCHN, LAMBDASER, KAPLAMBRATIO  Lab Results  Component Value Date   WBC 6.5 08/31/2017   NEUTROABS 3.2 08/31/2017   HGB 13.1 05/12/2017  HCT 36.9 08/31/2017   MCV 91.6 08/31/2017   PLT 284 08/31/2017      Chemistry      Component Value Date/Time   NA 141 08/31/2017 0855   NA 142 05/12/2017 0808   K 3.5 08/31/2017 0855   K 3.5 05/12/2017 0808   CL 106 08/31/2017 0855   CO2 25 08/31/2017 0855   CO2 26 05/12/2017 0808   BUN 6 (L) 08/31/2017 0855   BUN 9.8 05/12/2017 0808   CREATININE 0.79 08/31/2017 0855   CREATININE 0.9 05/12/2017 0808      Component Value Date/Time   CALCIUM 9.5 08/31/2017 0855   CALCIUM 9.3 05/12/2017 0808   ALKPHOS 119 08/31/2017 0855   ALKPHOS 90 05/12/2017 0808   AST 58 (H) 08/31/2017 0855   AST 15 05/12/2017 0808   ALT 32 08/31/2017 0855   ALT 10 05/12/2017 0808   BILITOT 0.6 08/31/2017 0855   BILITOT 0.56 05/12/2017 0808       No results found for: LABCA2  No components found for: DGLOVF643  No results for input(s): INR in the last 168 hours.  No results found for: LABCA2  No results found for: PIR518  No results found for: ACZ660  No results found for: YTK160  Lab Results  Component Value Date   CA2729 22.0 08/31/2017    No components found for: HGQUANT  No results found for: CEA1 / No results found for: CEA1   No results found for: AFPTUMOR  No results found for: CHROMOGRNA  No results found for: PSA1  No visits with results within 3 Day(s) from this visit.  Latest known visit with results is:  Appointment on 08/31/2017  Component Date Value Ref Range Status  . CA 27.29 08/31/2017 22.0  0.0 - 38.6 U/mL Final   Comment: (NOTE) Siemens Centaur Immunochemiluminometric Methodology [facility]) Values obtained with different assay methods or kits cannot be used interchangeably. Results cannot be interpreted as absolute evidence of the presence or absence of  malignant disease. Performed At: [facility] [place], [place] [id number] Rush Farmer MD DU:[id number] Performed at [facility], [place] [address], [place], [place] [address]   . Sodium 08/31/2017 141  136 - 145 mmol/L Final  . Potassium 08/31/2017 3.5  3.5 - 5.1 mmol/L Final  . Chloride 08/31/2017 106  98 - 109 mmol/L Final  . CO2 08/31/2017 25  22 - 29 mmol/L Final  . Glucose, Bld 08/31/2017 133  70 - 140 mg/dL Final  . BUN 08/31/2017 6* 7 - 26 mg/dL Final  . Creatinine 08/31/2017 0.79  0.60 - 1.10 mg/dL Final  . Calcium 08/31/2017 9.5  8.4 - 10.4 mg/dL Final  . Total Protein 08/31/2017 7.5  6.4 - 8.3 g/dL Final  . Albumin 08/31/2017 3.1* 3.5 - 5.0 g/dL Final  . AST 08/31/2017 58* 5 - 34 U/L Final  . ALT 08/31/2017 32  0 - 55 U/L Final  . Alkaline Phosphatase 08/31/2017 119  40 - 150 U/L Final  . Total Bilirubin 08/31/2017 0.6  0.2 - 1.2 mg/dL Final  . GFR, Est Non Af Am 08/31/2017 >60  >60 mL/min Final  . GFR, Est AFR Am 08/31/2017 >60  >60 mL/min Final   Comment: (NOTE) The eGFR has been calculated using the CKD EPI equation. This calculation has not been validated in all clinical situations. eGFR's persistently <60 mL/min signify possible Chronic Kidney Disease.   . Anion gap 08/31/2017 10  3 - 11 Final  Performed at [facility], [place] [address] [place], [place] [address]  . WBC Count 08/31/2017 6.5  3.9 - 10.3 K/uL Final  . RBC 08/31/2017 4.03  3.70 - 5.45 MIL/uL Final  . Hemoglobin 08/31/2017 11.9  11.6 - 15.9 g/dL Final  . HCT 08/31/2017 36.9  34.8 - 46.6 % Final  . MCV 08/31/2017 91.6  79.5 - 101.0 fL Final  . MCH 08/31/2017 29.5  25.1 - 34.0 pg Final  . MCHC 08/31/2017 32.2  31.5 - 36.0 g/dL Final  . RDW 08/31/2017 14.5  11.2 - 14.5 % Final  . Platelet Count 08/31/2017 284  145 - 400 K/uL Final  . Neutrophils Relative % 08/31/2017 49  % Final  . Neutro Abs 08/31/2017 3.2  1.5 - 6.5 K/uL  Final  . Lymphocytes Relative 08/31/2017 38  % Final  . Lymphs Abs 08/31/2017 2.5  0.9 - 3.3 K/uL Final  . Monocytes Relative 08/31/2017 9  % Final  . Monocytes Absolute 08/31/2017 0.6  0.1 - 0.9 K/uL Final  . Eosinophils Relative 08/31/2017 3  % Final  . Eosinophils Absolute 08/31/2017 0.2  0.0 - 0.5 K/uL Final  . Basophils Relative 08/31/2017 1  % Final  . Basophils Absolute 08/31/2017 0.1  0.0 - 0.1 K/uL Final   Performed at [facility], [address] [place],  [address]    (this displays the last labs from the last 3 days)  No results found for: TOTALPROTELP, ALBUMINELP, A1GS, A2GS, BETS, BETA2SER, GAMS, MSPIKE, SPEI (this displays SPEP labs)  No results found for: KPAFRELGTCHN, LAMBDASER, KAPLAMBRATIO (kappa/lambda light chains)  No results found for: HGBA, HGBA2QUANT, HGBFQUANT, HGBSQUAN (Hemoglobinopathy evaluation)   No results found for: LDH  No results found for: IRON, TIBC, IRONPCTSAT (Iron and TIBC)  No results found for: FERRITIN  Urinalysis    Component Value Date/Time   COLORURINE YELLOW 08/10/2017 0924   APPEARANCEUR CLEAR 08/10/2017 0924   LABSPEC 1.010 08/10/2017 0924   PHURINE 6.5 08/10/2017 [place] 08/10/2017 0924   HGBUR NEGATIVE 08/10/2017 [place] 08/10/2017 0924   KETONESUR NEGATIVE 08/10/2017 0924   PROTEINUR NEGATIVE 08/10/2017 0924   NITRITE NEGATIVE 08/10/2017 0924   LEUKOCYTESUR SMALL (A) 08/10/2017 0924     STUDIES: I discussed the recent biopsy with the patient and gave her a copy of the pathology  ELIGIBLE FOR AVAILABLE RESEARCH PROTOCOL:no  ASSESSMENT: 72 y.o. [place] woman (primarily residing in [place])  (1) status post left lumpectomy in 2012 for a reported pT2 pN1 breast cancer, the patient refusing adjuvant treatment (no chemotherapy, radiation, or antiestrogens).  METASTATIC DISEASE: December 2018 (2) status post left breast biopsy 03/30/2017 for a  clinical T4 N0, stage IIIB invasive ductal carcinoma, grade 3, triple positive, with an MIB-1 of 70%  (a) staging studies 05/21/2017 show multiple bilateral pulmonary nodules consistent with stage IV disease 05/21/2017; there are no liver or bone lesions noted  (b) breast MRI 05/13/2017 shows a 7 cm area of non-masslike enhancement in the right breast, with biopsy ([id number]) on 06/24/2017 showing: Metastatic carcinoma. Prognostic panel requested 01/15  (3) neoadjuvant chemotherapy with T-DM1 started 06/29/2016  (a) echocardiogram 06/24/2017 showed an ejection fraction of 60-65 %  (4) definitive surgery to follow depending on response: Patient desires bilateral mastectomies  (5) adjuvant radiation as appropriate  (6) anti estrogens after optimization of local therapy  (7) genetics testing  PLAN:  Tracy Howell is doing well today.  I reviewed her  CBC which was the only lab back during our appointment time with her.  It was normal.  She will proceed with treatment today.  I again reviewed Dr. Virgie Dad plan for her chemo with her.  Per his last note which stated, "The plan is for TDM 1 for at least 6 cycles or to optimal response.  At some point we will switch her over to trastuzumab alone and at that time we will move her treatments to every 4 weeks, which would be a lot more convenient for her.  At that time we will also start her on antiestrogens."  I reviewed that the decreased pain and softer breast is a good sign that things are improving.   I encouraged Tracy Howell to increase her fluid intake today.  We will get a urinalysis to fully evaluate her flank pain. Her blood pressure is mildly elevated.  She is unsure why that is.  She has a home BP cuff and she will check it at home.  She does have upcoming appointment with cardiology.    I reviewed her plan with Dr. Jana Hakim.  He wants to see her with every other treatment.  She will return in 3 weeks for labs and TDM1, and in 6 weeks for labs, f/u with  Dr. Jana Hakim, and TDM1.  She knows to call for any issues that may develop before her next visit.  A total of (30) minutes of face-to-face time was spent with this patient with greater than 50% of that time in counseling and care-coordination.  Wilber Bihari, NP  09/13/17 8:40 AM Medical Oncology and Hematology [facility] [address] [place], [place] [address] Tel. [phone]    Fax. [phone]

## 2017-09-15 NOTE — Progress Notes (Signed)
[place]  [phone] [phone]     ID: Naketa Daddario DOB: February 19, 1946  MR#: [id number]  WKM#:[id number]  Patient Care Team: Lucianne Lei, MD as PCP - General (Family Medicine) Alphonsa Overall, MD as Consulting Physician (General Surgery) Francoise Chojnowski, Virgie Dad, MD as Consulting Physician (Oncology) Eppie Gibson, MD as Attending Physician (Radiation Oncology) Juanita Craver, MD as Consulting Physician (Gastroenterology) OTHER MD:  CHIEF COMPLAINT: triple positive bilateral breast cancer  CURRENT TREATMENT: T-DM1   HISTORY OF CURRENT ILLNESS: From the original intake note:  "[name]" tells me she underwent left lumpectomy in [place] on 2012 for a 2.5 cm, grade 2 breast cancer involving one lymph node of 5 sampled. [ It may have been only 2 lymph nodes that were removed she says.]  This was at the [facility] and [facility], currently the [place] [facility] on [address].  The patient says she was told could have more treatment if she wanted it.  She did not see any sense in it.  She feels her left breast cancer was caused by the fact that she kept her iPhone in her bra right by the left breast.  Sometime in April 2018 she noted a new mass in the left breast. She did not immediately bring it to medical attention but as it continued to grow she mentioned her to her primary care physician and on 03/29/2017 [name] underwent bilateral diagnostic mammography with tomography and left breast ultrasonography at [facility].  The breast density was category B.  In the left breast central to the nipple there was a 6 cm irregular mass with indistinct margins and heterogeneous calcifications.  By ultrasound this measured 5 cm, with indistinct margins, at the 12:00 anterior area.  There was associated edema.  The left axilla was sonographically benign.  Biopsy of the left breast area in question March 30, 2017 showed ([id number])  invasive ductal carcinoma, grade 3, estrogen receptor 100% positive, progesterone receptor 60% positive, both with strong staining intensity, HER-2 amplified, with a signals ratio of 3.38, and the number per cell 7.95.  The  MIB-1 was 70%.  The patient's subsequent history is as detailed below.  INTERVAL HISTORY: [name] returns today for further evaluation and treatment of her triple positive breast cancer accompanied by her husband. She receives Kadcyla every 21 days, with today being day 1 cycle 5.  She tolerates this well.   She has had problems with taking Aleve due to having blood clots when she blows her nose. She stopped taking Alive because of this. Her stomach is upset when she took tremadol in the past.  As far as a history of bleeding, she did have hysterectomy and 2 C-sections without bleeding.  She did have an unusual episode of bleeding when she took Asprin after her tonsillectomy.  Currently she says she would rather tolerate the pain than takes pain medication.    REVIEW OF SYSTEMS: [name]  reports that she is doing okay. She is going to a graduation in the 1st-2nd week in May. She has occasional shooting pain in the left breast and axilla, and armpit. She notes that she is working a little, and her job is very concerned for her well being.  She denies unusual headaches, visual changes, nausea, vomiting, or dizziness. There has been no unusual cough, phlegm production, or pleurisy. This been no change in bowel or bladder habits. She denies unexplained fatigue or unexplained weight loss, rash, or fever. A detailed review of  systems was otherwise stable.    PAST MEDICAL HISTORY: Past Medical History:  Diagnosis Date  . Angioedema   . Breast cancer ([place]) 2013  . Complication of anesthesia    slow to wake up   . Pneumonia    hx of x 2   . recurrent left breast ca dx'd 04/2017  . SBO (small bowel obstruction) ([place]) 08/13/2014    PAST SURGICAL HISTORY: Past Surgical History:   Procedure Laterality Date  . ABDOMINAL HYSTERECTOMY    . APPENDECTOMY    . BREAST LUMPECTOMY  2013  . BREAST SURGERY    . CESAREAN SECTION     x2  . CHOLECYSTECTOMY    . PORTACATH PLACEMENT Right 05/17/2017   Procedure: INSERTION PORT-A-CATH right subclavian vein;  Surgeon: Alphonsa Overall, MD;  Location: [facility];  Service: General;  Laterality: Right;    FAMILY HISTORY Family History  Problem Relation Age of Onset  . Angioedema Mother   . Breast cancer Mother   . Breast cancer Maternal Aunt   The patient was adopted.  Her adoptive parents died in their 63s.  The patient's birth mother however died in her 72s from breast cancer.  The patient believes she has 2 brothers is not sure about sisters.  However she does have 2 maternal aunts who had breast cancer.  There is no history of ovarian cancer in the family to her knowledge.  The patient has no information regarding her father or his side of the family.   GYNECOLOGIC HISTORY:  No LMP recorded. Patient has had a hysterectomy. Menarche age 51, first live birth age 1, she is [place] P2.  She underwent hysterectomy at age 57.  She tells me they left a fourth of an ovary at that time.  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  She actually lives in [place], [place], and spends 2 weeks here every 2 months at her job.  When she is in town she actually lives with Dr. Criss Rosales. 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  She recently married [name] who is a Camera operator.  He has 2 children of his own, in [place] and [place].  The patient's own children are [name] who lives in [place] and works in Engineer, technical sales, and [employer] lives in [place] and owns a [employer].  The patient has no grandchildren.  She is 1/7-day [facility].  (She  tells me her religion has no bands on any medical treatments but she cannot have alcohol, cigarettes, or pork).   ADVANCED DIRECTIVES:    HEALTH MAINTENANCE: Social History   Tobacco Use  . Smoking status: Never Smoker  . Smokeless tobacco: Never Used  Substance Use Topics  . Alcohol use: No  . Drug use: No     Colonoscopy:  PAP:  Bone density:   Allergies  Allergen Reactions  . Onion Anaphylaxis, Nausea And Vomiting and Swelling    Throat swelling.  . Other Anaphylaxis and Nausea And Vomiting    Green Peppers-anaphylactic, nausea/vomiting/swelling  Mushrooms-nausea/vomiting.  Patient has hereditary angioedema (HAE)  . Codeine Nausea And Vomiting  . Morphine And Related Nausea And Vomiting  . Penicillins Rash    Vomiting Has patient had a PCN reaction causing immediate rash, facial/tongue/throat swelling, SOB or lightheadedness with hypotension: Yes Has  patient had a PCN reaction causing severe rash involving mucus membranes or skin necrosis: Yes Has patient had a PCN reaction that required hospitalization:Was inpatient when reaction occurred. Has patient had a PCN reaction occurring within the last 10 years: No If all of the above answers are "NO", then may proceed with Cephalosporin use.     Current Outpatient Medications  Medication Sig Dispense Refill  . Ascorbic Acid (VITAMIN C PO) Take 1 tablet by mouth daily.    . B Complex-C (B-COMPLEX WITH VITAMIN C) tablet Take 1 tablet by mouth daily.    . COD LIVER OIL PO Take 1 capsule by mouth daily.    Marland Kitchen lidocaine-prilocaine (EMLA) cream Apply 1 application topically as needed. 30 g 0  . Multiple Vitamin (MULTIVITAMIN WITH MINERALS) TABS tablet Take 1 tablet by mouth daily.     No current facility-administered medications for this visit.     OBJECTIVE: Older African-American woman who appears well  Vitals:   09/21/17 0903  BP: (!) 148/77  Pulse: 97  Resp: 18  Temp: 98.4 F (36.9 C)  SpO2: 98%     Body mass  index is 38.28 kg/m.   Wt Readings from Last 3 Encounters:  09/21/17 240 lb 12.8 oz (109.2 kg)  08/10/17 245 lb 4.8 oz (111.3 kg)  06/29/17 248 lb 6.4 oz (112.7 kg)  BP 160/72 on recheck manually  ECOG FS:1 - Symptomatic but completely ambulatory  Sclerae unicteric, EOMs intact Oropharynx clear and moist No cervical or supraclavicular adenopathy Lungs no rales or rhonchi Heart regular rate and rhythm Abd soft, nontender, positive bowel sounds MSK no focal spinal tenderness, no upper extremity lymphedema Neuro: nonfocal, well oriented, appropriate affect Breasts: The right breast shows no obvious skin changes.  There is a palpable mass in the upper outer quadrant measuring approximately 1-1/2 cm.  I do not palpate any right axillary adenopathy.  In the left breast the previously noted masses involving the skin are crusting over and eschar formation is noted.  I do not palpate obvious adenopathy in the left axilla  LAB RESULTS:  CMP     Component Value Date/Time   NA 141 08/31/2017 0855   NA 142 05/12/2017 0808   K 3.5 08/31/2017 0855   K 3.5 05/12/2017 0808   CL 106 08/31/2017 0855   CO2 25 08/31/2017 0855   CO2 26 05/12/2017 0808   GLUCOSE 133 08/31/2017 0855   GLUCOSE 133 05/12/2017 0808   BUN 6 (L) 08/31/2017 0855   BUN 9.8 05/12/2017 0808   CREATININE 0.79 08/31/2017 0855   CREATININE 0.9 05/12/2017 0808   CALCIUM 9.5 08/31/2017 0855   CALCIUM 9.3 05/12/2017 0808   PROT 7.5 08/31/2017 0855   PROT 7.3 05/12/2017 0808   ALBUMIN 3.1 (L) 08/31/2017 0855   ALBUMIN 3.3 (L) 05/12/2017 0808   AST 58 (H) 08/31/2017 0855   AST 15 05/12/2017 0808   ALT 32 08/31/2017 0855   ALT 10 05/12/2017 0808   ALKPHOS 119 08/31/2017 0855   ALKPHOS 90 05/12/2017 0808   BILITOT 0.6 08/31/2017 0855   BILITOT 0.56 05/12/2017 0808   GFRNONAA >60 08/31/2017 0855   GFRAA >60 08/31/2017 0855    No results found for: TOTALPROTELP, ALBUMINELP, A1GS, A2GS, BETS, BETA2SER, GAMS, MSPIKE,  SPEI  No results found for: KPAFRELGTCHN, LAMBDASER, KAPLAMBRATIO  Lab Results  Component Value Date   WBC 6.4 09/21/2017   NEUTROABS 3.2 09/21/2017   HGB 13.1 05/12/2017   HCT 37.8 09/21/2017   MCV  89.8 09/21/2017   PLT 267 09/21/2017      Chemistry      Component Value Date/Time   NA 141 08/31/2017 0855   NA 142 05/12/2017 0808   K 3.5 08/31/2017 0855   K 3.5 05/12/2017 0808   CL 106 08/31/2017 0855   CO2 25 08/31/2017 0855   CO2 26 05/12/2017 0808   BUN 6 (L) 08/31/2017 0855   BUN 9.8 05/12/2017 0808   CREATININE 0.79 08/31/2017 0855   CREATININE 0.9 05/12/2017 0808      Component Value Date/Time   CALCIUM 9.5 08/31/2017 0855   CALCIUM 9.3 05/12/2017 0808   ALKPHOS 119 08/31/2017 0855   ALKPHOS 90 05/12/2017 0808   AST 58 (H) 08/31/2017 0855   AST 15 05/12/2017 0808   ALT 32 08/31/2017 0855   ALT 10 05/12/2017 0808   BILITOT 0.6 08/31/2017 0855   BILITOT 0.56 05/12/2017 0808       No results found for: LABCA2  No components found for: SPQZRA076  No results for input(s): INR in the last 168 hours.  No results found for: LABCA2  No results found for: CAN199  No results found for: AUQ333  No results found for: LKT625  Lab Results  Component Value Date   CA2729 22.0 08/31/2017    No components found for: HGQUANT  No results found for: CEA1 / No results found for: CEA1   No results found for: AFPTUMOR  No results found for: [place]  No results found for: PSA1  Appointment on 09/21/2017  Component Date Value Ref Range Status  . WBC Count 09/21/2017 6.4  3.9 - 10.3 K/uL Final  . RBC 09/21/2017 4.21  3.70 - 5.45 MIL/uL Final  . Hemoglobin 09/21/2017 12.2  11.6 - 15.9 g/dL Final  . HCT 09/21/2017 37.8  34.8 - 46.6 % Final  . MCV 09/21/2017 89.8  79.5 - 101.0 fL Final  . MCH 09/21/2017 29.0  25.1 - 34.0 pg Final  . MCHC 09/21/2017 32.3  31.5 - 36.0 g/dL Final  . RDW 09/21/2017 14.9* 11.2 - 14.5 % Final  . Platelet Count 09/21/2017 267   145 - 400 K/uL Final  . Neutrophils Relative % 09/21/2017 50  % Final  . Neutro Abs 09/21/2017 3.2  1.5 - 6.5 K/uL Final  . Lymphocytes Relative 09/21/2017 37  % Final  . Lymphs Abs 09/21/2017 2.4  0.9 - 3.3 K/uL Final  . Monocytes Relative 09/21/2017 9  % Final  . Monocytes Absolute 09/21/2017 0.6  0.1 - 0.9 K/uL Final  . Eosinophils Relative 09/21/2017 3  % Final  . Eosinophils Absolute 09/21/2017 0.2  0.0 - 0.5 K/uL Final  . Basophils Relative 09/21/2017 1  % Final  . Basophils Absolute 09/21/2017 0.0  0.0 - 0.1 K/uL Final   Performed at [facility], [place] [address] [place], [place] [address]    (this displays the last labs from the last 3 days)  No results found for: TOTALPROTELP, ALBUMINELP, A1GS, A2GS, BETS, BETA2SER, GAMS, MSPIKE, SPEI (this displays SPEP labs)  No results found for: KPAFRELGTCHN, LAMBDASER, KAPLAMBRATIO (kappa/lambda light chains)  No results found for: HGBA, HGBA2QUANT, HGBFQUANT, HGBSQUAN (Hemoglobinopathy evaluation)   No results found for: LDH  No results found for: IRON, TIBC, IRONPCTSAT (Iron and TIBC)  No results found for: FERRITIN  Urinalysis    Component Value Date/Time   COLORURINE YELLOW 08/10/2017 [place] 08/10/2017 0924   LABSPEC 1.010 08/10/2017 [place]  6.5 08/10/2017 0924   GLUCOSEU NEGATIVE 08/10/2017 0924   HGBUR NEGATIVE 08/10/2017 0924   BILIRUBINUR NEGATIVE 08/10/2017 0924   KETONESUR NEGATIVE 08/10/2017 0924   PROTEINUR NEGATIVE 08/10/2017 0924   NITRITE NEGATIVE 08/10/2017 0924   LEUKOCYTESUR SMALL (A) 08/10/2017 0924     STUDIES: Repeat echocardiogram scheduled for 09/23/2017  ELIGIBLE FOR AVAILABLE RESEARCH PROTOCOL:no  ASSESSMENT: 72 y.o. [place] woman (primarily residing in [place])  (1) status post left lumpectomy in 2012 for a reported pT2 pN1 breast cancer, the patient refusing adjuvant treatment (no chemotherapy, radiation, or  antiestrogens).  METASTATIC DISEASE: December 2018 (bilateral triple positive disease) (2) status post left breast biopsy 03/30/2017 for a clinical T4 N0, stage IIIB invasive ductal carcinoma, grade 3, triple positive, with an MIB-1 of 70%  (a) staging studies 05/21/2017 show multiple bilateral pulmonary nodules consistent with stage IV disease 05/21/2017; there are no liver or bone lesions noted  (b) breast MRI 05/13/2017 shows a 7 cm area of non-masslike enhancement in the right breast, with biopsy ([id number]) on 06/24/2017 showing: Metastatic carcinoma. Prognostic panel was strongly ER and PR positive as well as HER-2 positive with the signals ratio of 2.61 and number per cell 5.35 (triple positive)  (3) neoadjuvant chemotherapy with T-DM1 started 06/29/2016  (a) echocardiogram 06/24/2017 showed an ejection fraction of 60-65 %  (4) definitive surgery to follow depending on response: Patient desires bilateral mastectomies  (5) anastrozole started 09/21/2017  (a) patient states was unable to tolerate tamoxifen in the past (nausea).  (6) genetics testing  PLAN:  Mercadies is tolerating the T-DM 1 moderately well and she is being very compliant with her treatments.  She thought today was her last treatment.  Of course she will be on anti-HER-2 treatment indefinitely.  We discussed the fact that stage IV breast cancer is likely chronic illness such as diabetes and high blood pressure, and in those cases when one stops treating than the cancer progresses.  After she has her surgery however we will switch her to trastuzumab alone and we will switch the dosing interval to every 4 weeks instead of every 3 weeks.  Today we discussed antiestrogens.  She tells me she was not able to tolerate tamoxifen previously because that she vomited.  We will going to try anastrozole.  I put the prescription in for her and she will let us know in 3 weeks how she is doing with that medication  If she is unable to  tolerate anastrozole we will switch her to fulvestrant.  I am requesting a phone Willebrand panel with the next set of labs because of the unusual bleeding she is having.  I have set her up for a CT scan of the chest to be done mid May.  I have alerted Dr. Lucia Gaskins of the patient's desire to have her surgery early June.  Aislyn knows to call for any other issues that may develop before her next visit here.  Laquonda Welby, Virgie Dad, MD  09/21/17 9:38 AM Medical Oncology and Hematology [facility] [address] [place], [place] [address] Tel. [phone]    Fax. [phone]  This document serves as a record of services personally performed by Lurline Del, MD. It was created on his behalf by Sheron Nightingale, a trained medical scribe. The creation of this record is based on the scribe's personal observations and the provider's statements to them.   I have reviewed the above documentation for accuracy and completeness, and I agree with the above.

## 2017-09-21 ENCOUNTER — Inpatient Hospital Stay: Payer: Medicare Other

## 2017-09-21 ENCOUNTER — Inpatient Hospital Stay: Payer: Medicare Other | Attending: Oncology

## 2017-09-21 ENCOUNTER — Inpatient Hospital Stay (HOSPITAL_BASED_OUTPATIENT_CLINIC_OR_DEPARTMENT_OTHER): Payer: Medicare Other | Admitting: Oncology

## 2017-09-21 VITALS — BP 148/77 | HR 97 | Temp 98.4°F | Resp 18 | Ht 66.5 in | Wt 240.8 lb

## 2017-09-21 DIAGNOSIS — C50212 Malignant neoplasm of upper-inner quadrant of left female breast: Secondary | ICD-10-CM | POA: Diagnosis not present

## 2017-09-21 DIAGNOSIS — Z803 Family history of malignant neoplasm of breast: Secondary | ICD-10-CM | POA: Diagnosis not present

## 2017-09-21 DIAGNOSIS — E109 Type 1 diabetes mellitus without complications: Secondary | ICD-10-CM | POA: Diagnosis not present

## 2017-09-21 DIAGNOSIS — Z5112 Encounter for antineoplastic immunotherapy: Secondary | ICD-10-CM | POA: Diagnosis not present

## 2017-09-21 DIAGNOSIS — Z79899 Other long term (current) drug therapy: Secondary | ICD-10-CM | POA: Insufficient documentation

## 2017-09-21 DIAGNOSIS — Z170 Estrogen receptor positive status [ER+]: Secondary | ICD-10-CM

## 2017-09-21 DIAGNOSIS — Z794 Long term (current) use of insulin: Secondary | ICD-10-CM | POA: Insufficient documentation

## 2017-09-21 DIAGNOSIS — C7800 Secondary malignant neoplasm of unspecified lung: Secondary | ICD-10-CM

## 2017-09-21 DIAGNOSIS — Z853 Personal history of malignant neoplasm of breast: Secondary | ICD-10-CM | POA: Insufficient documentation

## 2017-09-21 LAB — CMP (CANCER CENTER ONLY)
ALK PHOS: 107 U/L (ref 40–150)
ALT: 29 U/L (ref 0–55)
ANION GAP: 9 (ref 3–11)
AST: 91 U/L — ABNORMAL HIGH (ref 5–34)
Albumin: 3.2 g/dL — ABNORMAL LOW (ref 3.5–5.0)
BILIRUBIN TOTAL: 0.7 mg/dL (ref 0.2–1.2)
BUN: 9 mg/dL (ref 7–26)
CALCIUM: 9.6 mg/dL (ref 8.4–10.4)
CO2: 27 mmol/L (ref 22–29)
Chloride: 105 mmol/L (ref 98–109)
Creatinine: 0.79 mg/dL (ref 0.60–1.10)
GFR, Est AFR Am: >60 mL/min (ref >60)
GFR, Estimated: >60 mL/min (ref >60)
Glucose, Bld: 132 mg/dL (ref 70–140)
Potassium: 3.5 mmol/L (ref 3.5–5.1)
Sodium: 141 mmol/L (ref 136–145)
TOTAL PROTEIN: 7.8 g/dL (ref 6.4–8.3)

## 2017-09-21 LAB — CBC WITH DIFFERENTIAL (CANCER CENTER ONLY)
BASOS ABS: 0 10*3/uL (ref 0.0–0.1)
BASOS PCT: 1 %
Eosinophils Absolute: 0.2 10*3/uL (ref 0.0–0.5)
Eosinophils Relative: 3 %
HEMATOCRIT: 37.8 % (ref 34.8–46.6)
HEMOGLOBIN: 12.2 g/dL (ref 11.6–15.9)
Lymphocytes Relative: 37 %
Lymphs Abs: 2.4 10*3/uL (ref 0.9–3.3)
MCH: 29 pg (ref 25.1–34.0)
MCHC: 32.3 g/dL (ref 31.5–36.0)
MCV: 89.8 fL (ref 79.5–101.0)
MONO ABS: 0.6 10*3/uL (ref 0.1–0.9)
Monocytes Relative: 9 %
NEUTROS ABS: 3.2 10*3/uL (ref 1.5–6.5)
NEUTROS PCT: 50 %
Platelet Count: 267 10*3/uL (ref 145–400)
RBC: 4.21 MIL/uL (ref 3.70–5.45)
RDW: 14.9 % — AB (ref 11.2–14.5)
WBC Count: 6.4 10*3/uL (ref 3.9–10.3)

## 2017-09-21 MED ORDER — ACETAMINOPHEN 325 MG PO TABS
ORAL_TABLET | ORAL | Status: AC
  Filled 2017-09-21: qty 2

## 2017-09-21 MED ORDER — SODIUM CHLORIDE 0.9% FLUSH
10.0000 mL | INTRAVENOUS | Status: DC | PRN
  Administered 2017-09-21: 10 mL
  Filled 2017-09-21: qty 10

## 2017-09-21 MED ORDER — DIPHENHYDRAMINE HCL 25 MG PO CAPS
25.0000 mg | ORAL_CAPSULE | Freq: Once | ORAL | Status: AC
  Administered 2017-09-21: 25 mg via ORAL

## 2017-09-21 MED ORDER — SODIUM CHLORIDE 0.9% FLUSH
10.0000 mL | INTRAVENOUS | Status: DC | PRN
  Administered 2017-09-21: 10 mL via INTRAVENOUS
  Filled 2017-09-21: qty 10

## 2017-09-21 MED ORDER — DIPHENHYDRAMINE HCL 25 MG PO CAPS
ORAL_CAPSULE | ORAL | Status: AC
Start: 2017-09-21 — End: 2017-09-21
  Filled 2017-09-21: qty 1

## 2017-09-21 MED ORDER — SODIUM CHLORIDE 0.9 % IV SOLN
Freq: Once | INTRAVENOUS | Status: AC
  Administered 2017-09-21: 10:00:00 via INTRAVENOUS

## 2017-09-21 MED ORDER — ADO-TRASTUZUMAB EMTANSINE CHEMO INJECTION 160 MG
3.6000 mg/kg | Freq: Once | INTRAVENOUS | Status: AC
  Administered 2017-09-21: 400 mg via INTRAVENOUS
  Filled 2017-09-21: qty 4

## 2017-09-21 MED ORDER — ANASTROZOLE 1 MG PO TABS: 1.0000 mg | ORAL_TABLET | Freq: Every day | ORAL | 4 refills | Status: DC

## 2017-09-21 MED ORDER — HEPARIN SOD (PORK) LOCK FLUSH 100 UNIT/ML IV SOLN
500.0000 [IU] | Freq: Once | INTRAVENOUS | Status: AC | PRN
  Administered 2017-09-21: 500 [IU]
  Filled 2017-09-21: qty 5

## 2017-09-21 MED ORDER — ACETAMINOPHEN 325 MG PO TABS
650.0000 mg | ORAL_TABLET | Freq: Once | ORAL | Status: AC
  Administered 2017-09-21: 650 mg via ORAL

## 2017-09-21 NOTE — Patient Instructions (Signed)
[facility] Discharge Instructions for Patients Receiving Chemotherapy  Today you received the following chemotherapy agents Ado-trastuzumab  To help prevent nausea and vomiting after your treatment, we encourage you to take your nausea medication as directed  If you develop nausea and vomiting that is not controlled by your nausea medication, call the clinic.   BELOW ARE SYMPTOMS THAT SHOULD BE REPORTED IMMEDIATELY:  *FEVER GREATER THAN 100.5 F  *CHILLS WITH OR WITHOUT FEVER  NAUSEA AND VOMITING THAT IS NOT CONTROLLED WITH YOUR NAUSEA MEDICATION  *UNUSUAL SHORTNESS OF BREATH  *UNUSUAL BRUISING OR BLEEDING  TENDERNESS IN MOUTH AND THROAT WITH OR WITHOUT PRESENCE OF ULCERS  *URINARY PROBLEMS  *BOWEL PROBLEMS  UNUSUAL RASH Items with * indicate a potential emergency and should be followed up as soon as possible.  Feel free to call the clinic should you have any questions or concerns. The clinic phone number is [phone].  Please show the [place] at check-in to the Emergency Department and triage nurse.

## 2017-09-21 NOTE — Patient Instructions (Signed)
Implanted Port Home Guide An implanted port is a type of central line that is placed under the skin. Central lines are used to provide IV access when treatment or nutrition needs to be given through a person's veins. Implanted ports are used for long-term IV access. An implanted port may be placed because:  You need IV medicine that would be irritating to the small veins in your hands or arms.  You need long-term IV medicines, such as antibiotics.  You need IV nutrition for a long period.  You need frequent blood draws for lab tests.  You need dialysis.  Implanted ports are usually placed in the chest area, but they can also be placed in the upper arm, the abdomen, or the leg. An implanted port has two main parts:  Reservoir. The reservoir is round and will appear as a small, raised area under your skin. The reservoir is the part where a needle is inserted to give medicines or draw blood.  Catheter. The catheter is a thin, flexible tube that extends from the reservoir. The catheter is placed into a large vein. Medicine that is inserted into the reservoir goes into the catheter and then into the vein.  How will I care for my incision site? Do not get the incision site wet. Bathe or shower as directed by your health care provider. How is my port accessed? Special steps must be taken to access the port:  Before the port is accessed, a numbing cream can be placed on the skin. This helps numb the skin over the port site.  Your health care provider uses a sterile technique to access the port. ? Your health care provider must put on a mask and sterile gloves. ? The skin over your port is cleaned carefully with an antiseptic and allowed to dry. ? The port is gently pinched between sterile gloves, and a needle is inserted into the port.  Only "non-coring" port needles should be used to access the port. Once the port is accessed, a blood return should be checked. This helps ensure that the port  is in the vein and is not clogged.  If your port needs to remain accessed for a constant infusion, a clear (transparent) bandage will be placed over the needle site. The bandage and needle will need to be changed every week, or as directed by your health care provider.  Keep the bandage covering the needle clean and dry. Do not get it wet. Follow your health care provider's instructions on how to take a shower or bath while the port is accessed.  If your port does not need to stay accessed, no bandage is needed over the port.  What is flushing? Flushing helps keep the port from getting clogged. Follow your health care provider's instructions on how and when to flush the port. Ports are usually flushed with saline solution or a medicine called heparin. The need for flushing will depend on how the port is used.  If the port is used for intermittent medicines or blood draws, the port will need to be flushed: ? After medicines have been given. ? After blood has been drawn. ? As part of routine maintenance.  If a constant infusion is running, the port may not need to be flushed.  How long will my port stay implanted? The port can stay in for as long as your health care provider thinks it is needed. When it is time for the port to come out, surgery will be   done to remove it. The procedure is similar to the one performed when the port was put in. When should I seek immediate medical care? When you have an implanted port, you should seek immediate medical care if:  You notice a bad smell coming from the incision site.  You have swelling, redness, or drainage at the incision site.  You have more swelling or pain at the port site or the surrounding area.  You have a fever that is not controlled with medicine.  This information is not intended to replace advice given to you by your health care provider. Make sure you discuss any questions you have with your health care provider. Document  Released: 06/01/2005 Document Revised: 11/07/2015 Document Reviewed: 02/06/2013 Elsevier Interactive Patient Education  2017 Elsevier Inc.  

## 2017-09-21 NOTE — Progress Notes (Signed)
VO given and read back via phone/ Dr. Jana Hakim - ok to treat today with AST of 91.   Patient informed.

## 2017-09-23 ENCOUNTER — Telehealth: Payer: Self-pay | Admitting: *Deleted

## 2017-09-23 ENCOUNTER — Ambulatory Visit (HOSPITAL_COMMUNITY)
Admission: RE | Admit: 2017-09-23 | Discharge: 2017-09-23 | Disposition: A | Discharge status: Home / Self Care | Payer: Medicare Other | Source: Ambulatory Visit | Attending: Family Medicine | Admitting: Family Medicine

## 2017-09-23 ENCOUNTER — Encounter (HOSPITAL_COMMUNITY): Payer: Self-pay | Admitting: Internal Medicine

## 2017-09-23 ENCOUNTER — Ambulatory Visit (HOSPITAL_BASED_OUTPATIENT_CLINIC_OR_DEPARTMENT_OTHER)
Admission: RE | Admit: 2017-09-23 | Discharge: 2017-09-23 | Disposition: A | Discharge status: Home / Self Care | Payer: Medicare Other | Source: Ambulatory Visit | Attending: Internal Medicine | Admitting: Internal Medicine

## 2017-09-23 VITALS — BP 158/82 | HR 85 | Wt 234.8 lb

## 2017-09-23 DIAGNOSIS — R918 Other nonspecific abnormal finding of lung field: Secondary | ICD-10-CM | POA: Diagnosis not present

## 2017-09-23 DIAGNOSIS — Z885 Allergy status to narcotic agent status: Secondary | ICD-10-CM | POA: Diagnosis not present

## 2017-09-23 DIAGNOSIS — Z794 Long term (current) use of insulin: Secondary | ICD-10-CM | POA: Diagnosis not present

## 2017-09-23 DIAGNOSIS — I119 Hypertensive heart disease without heart failure: Secondary | ICD-10-CM | POA: Diagnosis not present

## 2017-09-23 DIAGNOSIS — Z170 Estrogen receptor positive status [ER+]: Secondary | ICD-10-CM | POA: Insufficient documentation

## 2017-09-23 DIAGNOSIS — E109 Type 1 diabetes mellitus without complications: Secondary | ICD-10-CM | POA: Diagnosis not present

## 2017-09-23 DIAGNOSIS — C50212 Malignant neoplasm of upper-inner quadrant of left female breast: Secondary | ICD-10-CM

## 2017-09-23 DIAGNOSIS — Z880 Allergy status to penicillin: Secondary | ICD-10-CM | POA: Diagnosis not present

## 2017-09-23 DIAGNOSIS — Z79899 Other long term (current) drug therapy: Secondary | ICD-10-CM | POA: Diagnosis not present

## 2017-09-23 DIAGNOSIS — I10 Essential (primary) hypertension: Secondary | ICD-10-CM

## 2017-09-23 DIAGNOSIS — Z5181 Encounter for therapeutic drug level monitoring: Secondary | ICD-10-CM | POA: Diagnosis not present

## 2017-09-23 DIAGNOSIS — Z886 Allergy status to analgesic agent status: Secondary | ICD-10-CM | POA: Insufficient documentation

## 2017-09-23 MED ORDER — LOSARTAN POTASSIUM 25 MG PO TABS: 25.0000 mg | ORAL_TABLET | Freq: Every day | ORAL | 6 refills | Status: DC

## 2017-09-23 NOTE — Progress Notes (Signed)
Cardio-Oncology Clinic Note   Referring Physician: Dr. Jana Hakim Primary Care: Primary Cardiologist: New (Dr. Haroldine Laws) Primary oncologist: Dr. Jana Hakim  HPI: Tracy Howell is a 72 y.o. 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  Left Breast CA Biopsy of the left breast area in question March 30, 2017 showed ([id number]) invasive ductal carcinoma, grade 3, estrogen receptor 100% positive, progesterone receptor 60% positive, both with strong staining intensity, HER-2 amplified, with a signals ratio of 3.38, and the number per cell 7.95.  The  MIB-1 was 70%.  Cancer history (1) status post left lumpectomy in 2012 for a reported pT2 pN1 breast cancer, the patient refusing adjuvant treatment (no chemotherapy, radiation, or antiestrogens). METASTATIC DISEASE: December 2018  (2) status post left breast biopsy 03/30/2017 for a clinical T4 N0, stage IIIB invasive ductal carcinoma, grade 3, triple positive, with an MIB-1 of 70%             (a) staging studies 05/21/2017 show multiple bilateral pulmonary nodules consistent with stage IV disease 05/21/2017; there are no liver or bone lesions noted             (b) breast MRI 05/13/2017 shows a 7 cm area of non-masslike enhancement in the right breast, with biopsy pending  (3) neoadjuvant chemotherapy with T-DM1 to start 06/29/2016 (4) definitive surgery to follow depending on response: Patient desires bilateral mastectomies (5) adjuvant radiation as appropriate (6) anti estrogens after optimization of local therapy (7) genetics testing  She is here today for f/u. She is here with her husband. She receives Kadcyla every 21 days. She is on cycle 5. Having a lot of pain in her breast and says this drives her BP up. Denies SOB, orthopnea or PND.     Echo  today (Personally reviewed) EF 65-70% Grade I DD LS 11.0 GLS 17.9  Echo 06/24/17 LVEF  60-65%, Grade 1 DD, LS' 9.1, GLS   05/21/2017 Chest CT 1. Multifocal pulmonary nodules are noted in both lungs worrisome for metastatic disease. 2. Prominent bilateral axillary lymph nodes noted measuring up to 1 cm. 3. Post treatment changes noted involving the left breast. 4. Aortic Atherosclerosis (ICD10-I70.0). Lad coronary artery calcifications.   Past Medical History:  Diagnosis Date  . Angioedema   . Breast cancer ([place]) 2013  . Complication of anesthesia    slow to wake up   . Pneumonia    hx of x 2   . recurrent left breast ca dx'd 04/2017  . SBO (small bowel obstruction) ([place]) 08/13/2014    Current Outpatient Medications  Medication Sig Dispense Refill  . Ascorbic Acid (VITAMIN C PO) Take 1 tablet by mouth daily.    . B Complex-C (B-COMPLEX WITH VITAMIN C) tablet Take 1 tablet by mouth daily.    . COD LIVER OIL PO Take 1 capsule by mouth daily.    Marland Kitchen lidocaine-prilocaine (EMLA) cream Apply 1 application topically as needed. 30 g 0  . Multiple Vitamin (MULTIVITAMIN WITH MINERALS) TABS tablet Take 1 tablet by mouth daily.    Marland Kitchen anastrozole (ARIMIDEX) 1 MG tablet Take 1 tablet (1 mg total) by mouth daily. (Patient not taking: Reported on 09/23/2017) 90 tablet 4   No current facility-administered medications for this encounter.     Allergies  Allergen Reactions  . Onion Anaphylaxis, Nausea And Vomiting and Swelling  Throat swelling.  . Other Anaphylaxis and Nausea And Vomiting    Green Peppers-anaphylactic, nausea/vomiting/swelling  Mushrooms-nausea/vomiting.  Patient has hereditary angioedema (HAE)  . Aleve [Naproxen] Other (See Comments)    bleeding  . Codeine Nausea And Vomiting  . Morphine And Related Nausea And Vomiting  . Penicillins Rash    Vomiting Has patient had a PCN reaction causing immediate rash, facial/tongue/throat swelling, SOB or lightheadedness with  hypotension: Yes Has patient had a PCN reaction causing severe rash involving mucus membranes or skin necrosis: Yes Has patient had a PCN reaction that required hospitalization:Was inpatient when reaction occurred. Has patient had a PCN reaction occurring within the last 10 years: No If all of the above answers are "NO", then may proceed with Cephalosporin use.    Social History   Socioeconomic History  . Marital status: Married    Spouse name: Not on file  . Number of children: Not on file  . Years of education: Not on file  . Highest education level: Not on file  Occupational History  . Not on file  Social Needs  . Financial resource strain: Not on file  . Food insecurity:    Worry: Not on file    Inability: Not on file  . Transportation needs:    Medical: Not on file    Non-medical: Not on file  Tobacco Use  . Smoking status: Never Smoker  . Smokeless tobacco: Never Used  Substance and Sexual Activity  . Alcohol use: No  . Drug use: No  . Sexual activity: Not on file  Lifestyle  . Physical activity:    Days per week: Not on file    Minutes per session: Not on file  . Stress: Not on file  Relationships  . Social connections:    Talks on phone: Not on file    Gets together: Not on file    Attends religious service: Not on file    Active member of club or organization: Not on file    Attends meetings of clubs or organizations: Not on file    Relationship status: Not on file  . Intimate partner violence:    Fear of current or ex partner: Not on file    Emotionally abused: Not on file    Physically abused: Not on file    Forced sexual activity: Not on file  Other Topics Concern  . Not on file  Social History Narrative  . Not on file    Family History  Problem Relation Age of Onset  . Angioedema Mother   . Breast cancer Mother   . Breast cancer Maternal Aunt    Vitals:   09/23/17 1009  BP: (!) 158/82  Pulse: 85  SpO2: 97%  Weight: 234 lb 12.8 oz (106.5  kg)   Wt Readings from Last 3 Encounters:  09/23/17 234 lb 12.8 oz (106.5 kg)  09/21/17 240 lb 12.8 oz (109.2 kg)  08/10/17 245 lb 4.8 oz (111.3 kg)   PHYSICAL EXAM: General:  Well appearing. No resp difficulty HEENT: normal Neck: supple. no JVD. Carotids 2+ bilat; no bruits. No lymphadenopathy or thryomegaly appreciated. Cor: PMI nondisplaced. Regular rate & rhythm. No rubs, gallops or murmurs. R port-a-cath Lungs: clear Abdomen:obese  soft, nontender, nondistended. No hepatosplenomegaly. No bruits or masses. Good bowel sounds. Extremities: no cyanosis, clubbing, rash, edema Neuro: alert & orientedx3, cranial nerves grossly intact. moves all 4 extremities w/o difficulty. Affect pleasant  moves all 4 extremities w/o difficulty. Affect pleasant.  ASSESSMENT &  PLAN:  1. Left Breast CA, Stage IV with pulmonary nodules. No liver or bone mets at this time.  - Biopsy of the left breast area in question March 30, 2017 showed ([id number]) invasive ductal carcinoma, grade 3, estrogen receptor 100% positive, progesterone receptor 60% positive, both with strong staining intensity, HER-2 amplified, with a signals ratio of 3.38, and the number per cell 7.95.  The  MIB-1 was 70%. -I reviewed echos personally. EF and Doppler parameters stable. No HF on exam. Continue Kadcyla  2. HTN - BP elevated today. She attributes this to pain but in reviewing all outpatient visits SBP runs typically 150 and on echo she has mild signs of hypertensive heart disease. Recent data suggests that uncontrolled HTN may increase risk for chemotoxicity so will work to control BP.  - Will start losartan 25    Glori Bickers, MD 09/23/17  10:18 AM

## 2017-09-23 NOTE — Telephone Encounter (Signed)
VM received from pt stating prescription written by Dr Jana Hakim will cost her $66 - " I can't pay that "  This RN contacted pt's pharmacy and verified cost of anastrazole for 90 day supply is $500 - which her insurance will pay $130 - out of pocket cost will be $430 for the patient.  This RN contacted the patient to discuss above - with plan for pt to be referred to [facility] for possible benefits or resources to assist with cost.  Patient stated appreciation and will await call per above.  This note will be forwarded to [facility]

## 2017-09-23 NOTE — Patient Instructions (Signed)
Start Losartan 25 mg daily  We will contact you in 3 months to schedule your next appointment and echocardiogram

## 2017-09-23 NOTE — Progress Notes (Signed)
  Echocardiogram 2D Echocardiogram has been performed.  Deborra Phegley L Androw 09/23/2017, 9:39 AM

## 2017-09-27 ENCOUNTER — Encounter: Payer: Self-pay | Admitting: Oncology

## 2017-09-27 NOTE — Progress Notes (Signed)
Rcvd staff msg from nurse Val stating pt needs assistance paying for Anastrazole.  I called and left a vm for pt to return my call to discuss copay assistance.

## 2017-09-29 ENCOUNTER — Encounter: Payer: Self-pay | Admitting: Oncology

## 2017-09-29 NOTE — Progress Notes (Signed)
Called pt again to discuss copay assistance for Anastrazole.  She informed me she just got assistance w/ [employer] so she's able to receive the medication for $11.08.  She appreciated my help.

## 2017-10-12 ENCOUNTER — Telehealth: Payer: Self-pay | Admitting: Adult Health

## 2017-10-12 ENCOUNTER — Ambulatory Visit: Payer: Medicare Other

## 2017-10-12 ENCOUNTER — Ambulatory Visit: Payer: Medicare Other | Admitting: Oncology

## 2017-10-12 ENCOUNTER — Other Ambulatory Visit: Payer: Medicare Other

## 2017-10-12 ENCOUNTER — Inpatient Hospital Stay: Payer: Medicare Other

## 2017-10-12 ENCOUNTER — Ambulatory Visit (HOSPITAL_COMMUNITY)
Admission: RE | Admit: 2017-10-12 | Discharge: 2017-10-12 | Disposition: A | Discharge status: Home / Self Care | Payer: Medicare Other | Source: Ambulatory Visit | Attending: Oncology | Admitting: Oncology

## 2017-10-12 ENCOUNTER — Encounter (HOSPITAL_COMMUNITY): Payer: Self-pay

## 2017-10-12 ENCOUNTER — Encounter: Payer: Self-pay | Admitting: Adult Health

## 2017-10-12 ENCOUNTER — Inpatient Hospital Stay (HOSPITAL_BASED_OUTPATIENT_CLINIC_OR_DEPARTMENT_OTHER): Payer: Medicare Other | Admitting: Adult Health

## 2017-10-12 VITALS — BP 148/66 | HR 81 | Temp 97.8°F | Resp 18 | Ht 66.5 in | Wt 234.0 lb

## 2017-10-12 DIAGNOSIS — C7800 Secondary malignant neoplasm of unspecified lung: Secondary | ICD-10-CM | POA: Diagnosis not present

## 2017-10-12 DIAGNOSIS — C50212 Malignant neoplasm of upper-inner quadrant of left female breast: Secondary | ICD-10-CM | POA: Diagnosis not present

## 2017-10-12 DIAGNOSIS — R918 Other nonspecific abnormal finding of lung field: Secondary | ICD-10-CM | POA: Insufficient documentation

## 2017-10-12 DIAGNOSIS — Z170 Estrogen receptor positive status [ER+]: Secondary | ICD-10-CM

## 2017-10-12 DIAGNOSIS — E109 Type 1 diabetes mellitus without complications: Secondary | ICD-10-CM

## 2017-10-12 DIAGNOSIS — C7951 Secondary malignant neoplasm of bone: Secondary | ICD-10-CM | POA: Diagnosis not present

## 2017-10-12 DIAGNOSIS — Z803 Family history of malignant neoplasm of breast: Secondary | ICD-10-CM | POA: Diagnosis not present

## 2017-10-12 DIAGNOSIS — I700 Atherosclerosis of aorta: Secondary | ICD-10-CM | POA: Insufficient documentation

## 2017-10-12 DIAGNOSIS — Z79899 Other long term (current) drug therapy: Secondary | ICD-10-CM

## 2017-10-12 DIAGNOSIS — C779 Secondary and unspecified malignant neoplasm of lymph node, unspecified: Secondary | ICD-10-CM | POA: Diagnosis not present

## 2017-10-12 DIAGNOSIS — Z794 Long term (current) use of insulin: Secondary | ICD-10-CM

## 2017-10-12 DIAGNOSIS — Z5112 Encounter for antineoplastic immunotherapy: Secondary | ICD-10-CM | POA: Diagnosis not present

## 2017-10-12 DIAGNOSIS — I2510 Atherosclerotic heart disease of native coronary artery without angina pectoris: Secondary | ICD-10-CM | POA: Diagnosis not present

## 2017-10-12 DIAGNOSIS — Z853 Personal history of malignant neoplasm of breast: Secondary | ICD-10-CM | POA: Diagnosis not present

## 2017-10-12 DIAGNOSIS — Z95828 Presence of other vascular implants and grafts: Secondary | ICD-10-CM

## 2017-10-12 LAB — CBC WITH DIFFERENTIAL (CANCER CENTER ONLY)
BASOS ABS: 0.1 10*3/uL (ref 0.0–0.1)
Basophils Relative: 1 %
Eosinophils Absolute: 0.3 10*3/uL (ref 0.0–0.5)
Eosinophils Relative: 5 %
HCT: 37.7 % (ref 34.8–46.6)
HEMOGLOBIN: 12.4 g/dL (ref 11.6–15.9)
LYMPHS PCT: 35 %
Lymphs Abs: 2.2 10*3/uL (ref 0.9–3.3)
MCH: 28.6 pg (ref 25.1–34.0)
MCHC: 32.9 g/dL (ref 31.5–36.0)
MCV: 86.9 fL (ref 79.5–101.0)
Monocytes Absolute: 0.7 10*3/uL (ref 0.1–0.9)
Monocytes Relative: 11 %
NEUTROS ABS: 2.9 10*3/uL (ref 1.5–6.5)
NEUTROS PCT: 48 %
PLATELETS: 218 10*3/uL (ref 145–400)
RBC: 4.33 MIL/uL (ref 3.70–5.45)
RDW: 15.8 % — ABNORMAL HIGH (ref 11.2–14.5)
WBC Count: 6.2 10*3/uL (ref 3.9–10.3)

## 2017-10-12 LAB — CMP (CANCER CENTER ONLY)
ALK PHOS: 120 U/L (ref 40–150)
ALT: 39 U/L (ref 0–55)
AST: 86 U/L — AB (ref 5–34)
Albumin: 3.2 g/dL — ABNORMAL LOW (ref 3.5–5.0)
Anion gap: 8 (ref 3–11)
BILIRUBIN TOTAL: 0.6 mg/dL (ref 0.2–1.2)
BUN: 7 mg/dL (ref 7–26)
CHLORIDE: 105 mmol/L (ref 98–109)
CO2: 26 mmol/L (ref 22–29)
Calcium: 9.5 mg/dL (ref 8.4–10.4)
Creatinine: 0.72 mg/dL (ref 0.60–1.10)
GFR, Est AFR Am: >60 mL/min (ref >60)
Glucose, Bld: 90 mg/dL (ref 70–140)
Potassium: 3.6 mmol/L (ref 3.5–5.1)
Sodium: 139 mmol/L (ref 136–145)
Total Protein: 7.9 g/dL (ref 6.4–8.3)

## 2017-10-12 MED ORDER — DIPHENHYDRAMINE HCL 25 MG PO CAPS
25.0000 mg | ORAL_CAPSULE | Freq: Once | ORAL | Status: AC
  Administered 2017-10-12: 25 mg via ORAL

## 2017-10-12 MED ORDER — SODIUM CHLORIDE 0.9 % IV SOLN
3.6000 mg/kg | Freq: Once | INTRAVENOUS | Status: AC
  Administered 2017-10-12: 400 mg via INTRAVENOUS
  Filled 2017-10-12: qty 20

## 2017-10-12 MED ORDER — SODIUM CHLORIDE 0.9% FLUSH
10.0000 mL | INTRAVENOUS | Status: DC | PRN
  Administered 2017-10-12: 10 mL
  Filled 2017-10-12: qty 10

## 2017-10-12 MED ORDER — ACETAMINOPHEN 325 MG PO TABS
650.0000 mg | ORAL_TABLET | Freq: Once | ORAL | Status: AC
  Administered 2017-10-12: 650 mg via ORAL

## 2017-10-12 MED ORDER — HEPARIN SOD (PORK) LOCK FLUSH 100 UNIT/ML IV SOLN
500.0000 [IU] | Freq: Once | INTRAVENOUS | Status: AC | PRN
  Administered 2017-10-12: 500 [IU]
  Filled 2017-10-12: qty 5

## 2017-10-12 MED ORDER — HEPARIN SOD (PORK) LOCK FLUSH 100 UNIT/ML IV SOLN
INTRAVENOUS | Status: AC
  Filled 2017-10-12: qty 5

## 2017-10-12 MED ORDER — SODIUM CHLORIDE 0.9% FLUSH
10.0000 mL | Freq: Once | INTRAVENOUS | Status: AC
  Administered 2017-10-12: 10 mL
  Filled 2017-10-12: qty 10

## 2017-10-12 MED ORDER — DIPHENHYDRAMINE HCL 25 MG PO CAPS
ORAL_CAPSULE | ORAL | Status: AC
  Filled 2017-10-12: qty 1

## 2017-10-12 MED ORDER — ACETAMINOPHEN 325 MG PO TABS
ORAL_TABLET | ORAL | Status: AC
  Filled 2017-10-12: qty 2

## 2017-10-12 MED ORDER — IOHEXOL 300 MG/ML  SOLN
75.0000 mL | Freq: Once | INTRAMUSCULAR | Status: AC | PRN
  Administered 2017-10-12: 75 mL via INTRAVENOUS

## 2017-10-12 MED ORDER — SODIUM CHLORIDE 0.9 % IV SOLN
Freq: Once | INTRAVENOUS | Status: AC
  Administered 2017-10-12: 10:00:00 via INTRAVENOUS

## 2017-10-12 NOTE — Telephone Encounter (Signed)
Gave patient AVs and calendar of upcoming May appointments.

## 2017-10-12 NOTE — Progress Notes (Signed)
[place]  Telephone:([phone]) [phone] Fax:(336) [phone]     ID: Tracy Howell DOB: June 20, 1945  MR#: [id number]  WUX#:[id number]  Patient Care Team: Lucianne Lei, MD as PCP - General (Family Medicine) Alphonsa Overall, MD as Consulting Physician (General Surgery) Magrinat, Virgie Dad, MD as Consulting Physician (Oncology) Eppie Gibson, MD as Attending Physician (Radiation Oncology) Juanita Craver, MD as Consulting Physician (Gastroenterology) OTHER MD:  CHIEF COMPLAINT: triple positive bilateral breast cancer  CURRENT TREATMENT: T-DM1   HISTORY OF CURRENT ILLNESS: From the original intake note:  "Tracy Howell" tells me she underwent left lumpectomy in [place] on 2012 for a 2.5 cm, grade 2 breast cancer involving one lymph node of 5 sampled. [ It may have been only 2 lymph nodes that were removed she says.]  This was at the [facility], currently the [place] [facility] [address].  The patient says she was told could have more treatment if she wanted it.  She did not see any sense in it.  She feels her left breast cancer was caused by the fact that she kept her iPhone in her bra right by the left breast.  Sometime in April 2018 she noted a new mass in the left breast. She did not immediately bring it to medical attention but as it continued to grow she mentioned her to her primary care physician and on 03/29/2017 Tracy Howell underwent bilateral diagnostic mammography with tomography and left breast ultrasonography at [facility].  The breast density was category B.  In the left breast central to the nipple there was a 6 cm irregular mass with indistinct margins and heterogeneous calcifications.  By ultrasound this measured 5 cm, with indistinct margins, at the 12:00 anterior area.  There was associated edema.  The left axilla was sonographically benign.  Biopsy of the left breast area in question March 30, 2017 showed ([id number])  invasive ductal carcinoma, grade 3, estrogen receptor 100% positive, progesterone receptor 60% positive, both with strong staining intensity, HER-2 amplified, with a signals ratio of 3.38, and the number per cell 7.95.  The  MIB-1 was 70%.  The patient's subsequent history is as detailed below.  INTERVAL HISTORY: Tracy Howell returns today for further evaluation and treatment of her triple positive breast cancer accompanied by her husband. She receives Kadcyla every 21 days, with today being day 1 cycle 5.  She tolerates this well.    REVIEW OF SYSTEMS: Tracy Howell is doing well today.  She is concerned about her liver function tests.  On 2/26 it was 51, and most recently on 4/9 it was 91.  She is concerned about what her result will be today.  Tracy Howell cannot take NSAIDs or Tylenol.  She notes that she has nose bleeds when this happens.  She has stopped taking all OTC meds and is just dealing with the pain at this point.  She has not yet started Anastrozole due to cost.  She was started on Losartan by Dr. Haroldine Laws.  Tracy Howell says that she stopped it after one dose because it made her feel dizzy.    Tracy Howell notes that her tumor is getting harder, and that she feels like it is enlarging.  She underwent CT chest this morning.     PAST MEDICAL HISTORY: Past Medical History:  Diagnosis Date  . Angioedema   . Breast cancer ([place]) 2013  . Complication of anesthesia    slow to wake up   . Pneumonia    hx of  x 2   . recurrent left breast ca dx'd 04/2017  . SBO (small bowel obstruction) ([place]) 08/13/2014    PAST SURGICAL HISTORY: Past Surgical History:  Procedure Laterality Date  . ABDOMINAL HYSTERECTOMY    . APPENDECTOMY    . BREAST LUMPECTOMY  2013  . BREAST SURGERY    . CESAREAN SECTION     x2  . CHOLECYSTECTOMY    . PORTACATH PLACEMENT Right 05/17/2017   Procedure: INSERTION PORT-A-CATH right subclavian vein;  Surgeon: Alphonsa Overall, MD;  Location: [facility];  Service: General;  Laterality: Right;     FAMILY HISTORY Family History  Problem Relation Age of Onset  . Angioedema Mother   . Breast cancer Mother   . Breast cancer Maternal Aunt   The patient was adopted.  Her adoptive parents died in their 62s.  The patient's birth mother however died in her 72s from breast cancer.  The patient believes she has 2 brothers is not sure about sisters.  However she does have 2 maternal aunts who had breast cancer.  There is no history of ovarian cancer in the family to her knowledge.  The patient has no information regarding her father or his side of the family.   GYNECOLOGIC HISTORY:  No LMP recorded. Patient has had a hysterectomy. Menarche age 52, first live birth age 69, she is [place] P2.  She underwent hysterectomy at age 57.  She tells me they left a fourth of an ovary at that time.  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  She actually lives in [place], [place], and spends 2 weeks here every 2 months at her job.  When she is in town she actually lives with Dr. Criss Rosales. 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  She recently married [name] who is a Camera operator.  He has 2 children of his own, in [place] and [place].  The patient's own children are [name] who lives in [place] and works in Engineer, technical sales, and [employer] lives in [place] and owns a [place].  The patient has no grandchildren.  She is 1/7-day [facility].  (She tells me her religion has no bands on any medical treatments but she cannot have alcohol, cigarettes, or pork).   ADVANCED DIRECTIVES:    HEALTH MAINTENANCE: Social History   Tobacco Use  . Smoking status: Never Smoker  . Smokeless tobacco: Never Used  Substance Use Topics  . Alcohol use: No  . Drug use: No     Colonoscopy:  PAP:  Bone  density:   Allergies  Allergen Reactions  . Onion Anaphylaxis, Nausea And Vomiting and Swelling    Throat swelling.  . Other Anaphylaxis and Nausea And Vomiting    Green Peppers-anaphylactic, nausea/vomiting/swelling  Mushrooms-nausea/vomiting.  Patient has hereditary angioedema (HAE)  . Aleve [Naproxen] Other (See Comments)    bleeding  . Codeine Nausea And Vomiting  . Morphine And Related Nausea And Vomiting  . Penicillins Rash    Vomiting Has patient had a PCN reaction causing immediate rash, facial/tongue/throat swelling, SOB or lightheadedness with hypotension: Yes Has patient had a PCN reaction causing severe rash involving mucus membranes or skin necrosis: Yes Has patient had a PCN reaction that required hospitalization:Was inpatient when reaction occurred. Has patient had a PCN reaction occurring within  the last 10 years: No If all of the above answers are "NO", then may proceed with Cephalosporin use.     Current Outpatient Medications  Medication Sig Dispense Refill  . Ascorbic Acid (VITAMIN C PO) Take 1 tablet by mouth daily.    . B Complex-C (B-COMPLEX WITH VITAMIN C) tablet Take 1 tablet by mouth daily.    . COD LIVER OIL PO Take 1 capsule by mouth daily.    Marland Kitchen lidocaine-prilocaine (EMLA) cream Apply 1 application topically as needed. 30 g 0  . losartan (COZAAR) 25 MG tablet Take 1 tablet (25 mg total) by mouth daily. 30 tablet 6  . Multiple Vitamin (MULTIVITAMIN WITH MINERALS) TABS tablet Take 1 tablet by mouth daily.    Marland Kitchen anastrozole (ARIMIDEX) 1 MG tablet Take 1 tablet (1 mg total) by mouth daily. (Patient not taking: Reported on 09/23/2017) 90 tablet 4   No current facility-administered medications for this visit.    Facility-Administered Medications Ordered in Other Visits  Medication Dose Route Frequency Provider Last Rate Last Dose  . heparin lock flush 100 UNIT/ML injection             OBJECTIVE:   Vitals:   10/12/17 0838  BP: (!) 148/66  Pulse: 81   Resp: 18  Temp: 97.8 F (36.6 C)  SpO2: 96%     Body mass index is 37.2 kg/m.   Wt Readings from Last 3 Encounters:  10/12/17 234 lb (106.1 kg)  09/23/17 234 lb 12.8 oz (106.5 kg)  09/21/17 240 lb 12.8 oz (109.2 kg)    ECOG FS:1 - Symptomatic but completely ambulatory GENERAL: Patient is a well appearing female in no acute distress HEENT:  Sclerae anicteric.  Oropharynx clear and moist. No ulcerations or evidence of oropharyngeal candidiasis. Neck is supple.  NODES:  No cervical, supraclavicular, or axillary lymphadenopathy palpated.  BREAST EXAM:  Breast tumor is more erythematous and harder LUNGS:  Clear to auscultation bilaterally.  No wheezes or rhonchi. HEART:  Regular rate and rhythm. No murmur appreciated. ABDOMEN:  Soft, nontender.  Positive, normoactive bowel sounds. No organomegaly palpated. MSK:  No focal spinal tenderness to palpation. Full range of motion bilaterally in the upper extremities. EXTREMITIES:  No peripheral edema.   SKIN:  Clear with no obvious rashes or skin changes. No nail dyscrasia. NEURO:  Nonfocal. Well oriented.  Appropriate affect.    LAB RESULTS:  CMP     Component Value Date/Time   NA 141 09/21/2017 0850   NA 142 05/12/2017 0808   K 3.5 09/21/2017 0850   K 3.5 05/12/2017 0808   CL 105 09/21/2017 0850   CO2 27 09/21/2017 0850   CO2 26 05/12/2017 0808   GLUCOSE 132 09/21/2017 0850   GLUCOSE 133 05/12/2017 0808   BUN 9 09/21/2017 0850   BUN 9.8 05/12/2017 0808   CREATININE 0.79 09/21/2017 0850   CREATININE 0.9 05/12/2017 0808   CALCIUM 9.6 09/21/2017 0850   CALCIUM 9.3 05/12/2017 0808   PROT 7.8 09/21/2017 0850   PROT 7.3 05/12/2017 0808   ALBUMIN 3.2 (L) 09/21/2017 0850   ALBUMIN 3.3 (L) 05/12/2017 0808   AST 91 (H) 09/21/2017 0850   AST 15 05/12/2017 0808   ALT 29 09/21/2017 0850   ALT 10 05/12/2017 0808   ALKPHOS 107 09/21/2017 0850   ALKPHOS 90 05/12/2017 0808   BILITOT 0.7 09/21/2017 0850   BILITOT 0.56 05/12/2017 0808    GFRNONAA >60 09/21/2017 0850   GFRAA >60 09/21/2017 2947  No results found for: Ronnald Ramp, A1GS, A2GS, BETS, BETA2SER, GAMS, MSPIKE, SPEI  No results found for: Nils Pyle, [facility]  Lab Results  Component Value Date   WBC 6.2 10/12/2017   NEUTROABS 2.9 10/12/2017   HGB 12.4 10/12/2017   HCT 37.7 10/12/2017   MCV 86.9 10/12/2017   PLT 218 10/12/2017      Chemistry      Component Value Date/Time   NA 141 09/21/2017 0850   NA 142 05/12/2017 0808   K 3.5 09/21/2017 0850   K 3.5 05/12/2017 0808   CL 105 09/21/2017 0850   CO2 27 09/21/2017 0850   CO2 26 05/12/2017 0808   BUN 9 09/21/2017 0850   BUN 9.8 05/12/2017 0808   CREATININE 0.79 09/21/2017 0850   CREATININE 0.9 05/12/2017 0808      Component Value Date/Time   CALCIUM 9.6 09/21/2017 0850   CALCIUM 9.3 05/12/2017 0808   ALKPHOS 107 09/21/2017 0850   ALKPHOS 90 05/12/2017 0808   AST 91 (H) 09/21/2017 0850   AST 15 05/12/2017 0808   ALT 29 09/21/2017 0850   ALT 10 05/12/2017 0808   BILITOT 0.7 09/21/2017 0850   BILITOT 0.56 05/12/2017 0808       No results found for: LABCA2  No components found for: [id number]  No results for input(s): INR in the last 168 hours.  No results found for: LABCA2  No results found for: CAN199  No results found for: YQI347  No results found for: QQV956  Lab Results  Component Value Date   CA2729 22.0 08/31/2017    No components found for: HGQUANT  No results found for: CEA1 / No results found for: CEA1   No results found for: AFPTUMOR  No results found for: [place]  No results found for: PSA1  Appointment on 10/12/2017  Component Date Value Ref Range Status  . WBC Count 10/12/2017 6.2  3.9 - 10.3 K/uL Final  . RBC 10/12/2017 4.33  3.70 - 5.45 MIL/uL Final  . Hemoglobin 10/12/2017 12.4  11.6 - 15.9 g/dL Final  . HCT 10/12/2017 37.7  34.8 - 46.6 % Final  . MCV 10/12/2017 86.9  79.5 - 101.0 fL Final  . MCH 10/12/2017 28.6   25.1 - 34.0 pg Final  . MCHC 10/12/2017 32.9  31.5 - 36.0 g/dL Final  . RDW 10/12/2017 15.8* 11.2 - 14.5 % Final  . Platelet Count 10/12/2017 218  145 - 400 K/uL Final  . Neutrophils Relative % 10/12/2017 48  % Final  . Neutro Abs 10/12/2017 2.9  1.5 - 6.5 K/uL Final  . Lymphocytes Relative 10/12/2017 35  % Final  . Lymphs Abs 10/12/2017 2.2  0.9 - 3.3 K/uL Final  . Monocytes Relative 10/12/2017 11  % Final  . Monocytes Absolute 10/12/2017 0.7  0.1 - 0.9 K/uL Final  . Eosinophils Relative 10/12/2017 5  % Final  . Eosinophils Absolute 10/12/2017 0.3  0.0 - 0.5 K/uL Final  . Basophils Relative 10/12/2017 1  % Final  . Basophils Absolute 10/12/2017 0.1  0.0 - 0.1 K/uL Final   Performed at [facility], [place] [address], [place], [place] [address]    (this displays the last labs from the last 3 days)  No results found for: TOTALPROTELP, ALBUMINELP, A1GS, A2GS, BETS, BETA2SER, GAMS, MSPIKE, SPEI (this displays SPEP labs)  No results found for: KPAFRELGTCHN, LAMBDASER, KAPLAMBRATIO (kappa/lambda light chains)  No results found for: HGBA, HGBA2QUANT, HGBFQUANT, HGBSQUAN (Hemoglobinopathy evaluation)   No  results found for: LDH  No results found for: IRON, TIBC, IRONPCTSAT (Iron and TIBC)  No results found for: FERRITIN  Urinalysis    Component Value Date/Time   COLORURINE YELLOW 08/10/2017 0924   APPEARANCEUR CLEAR 08/10/2017 0924   LABSPEC 1.010 08/10/2017 0924   PHURINE 6.5 08/10/2017 [place] 08/10/2017 0924   HGBUR NEGATIVE 08/10/2017 0924   BILIRUBINUR NEGATIVE 08/10/2017 0924   KETONESUR NEGATIVE 08/10/2017 0924   PROTEINUR NEGATIVE 08/10/2017 0924   NITRITE NEGATIVE 08/10/2017 0924   LEUKOCYTESUR SMALL (A) 08/10/2017 0924     STUDIES: Repeat echocardiogram scheduled for 09/23/2017  ELIGIBLE FOR AVAILABLE RESEARCH PROTOCOL:no  ASSESSMENT: 72 y.o. [place] woman (primarily residing in [place])  (1) status post left  lumpectomy in 2012 for a reported pT2 pN1 breast cancer, the patient refusing adjuvant treatment (no chemotherapy, radiation, or antiestrogens).  METASTATIC DISEASE: December 2018 (bilateral triple positive disease) (2) status post left breast biopsy 03/30/2017 for a clinical T4 N0, stage IIIB invasive ductal carcinoma, grade 3, triple positive, with an MIB-1 of 70%  (a) staging studies 05/21/2017 show multiple bilateral pulmonary nodules consistent with stage IV disease 05/21/2017; there are no liver or bone lesions noted  (b) breast MRI 05/13/2017 shows a 7 cm area of non-masslike enhancement in the right breast, with biopsy ([id number]) on 06/24/2017 showing: Metastatic carcinoma. Prognostic panel was strongly ER and PR positive as well as HER-2 positive with the signals ratio of 2.61 and number per cell 5.35 (triple positive)  (3) neoadjuvant chemotherapy with T-DM1 started 06/29/2016  (a) echocardiogram 06/24/2017 showed an ejection fraction of 60-65 %  (B) echocardiogram 09/23/2017 showed an ejection fraction of 65-70%  (c) CT chest on 10/12/2017 concerning for progression in nodes, pulmonary nodules, and bone mets  (4) definitive surgery to follow depending on response: Patient desires bilateral mastectomies (to switch to Herceptin every 4 weeks after surgery)  (5) anastrozole written on 09/21/2017 not yet started  (a) patient states was unable to tolerate tamoxifen in the past (nausea).  (6) genetics testing  PLAN:  Tracy Howell has symptoms and her CT scan concerning for progression.  I reviewed this with Dr. Jana Hakim.  She will receive Kadcyla today and we will expedite her appointment with her surgeon.  She will return next week for follow up with myself and Dr. Jana Hakim to discuss treatment changes.  I encouraged her to go ahead and get the Anastrozole filled and start taking it.    Tracy Howell will return next week for follow up.  She knows to call between now and then for any questions or  concerns.    A total of (30) minutes of face-to-face time was spent with this patient with greater than 50% of that time in counseling and care-coordination.  Wilber Bihari, NP  10/12/17 8:45 AM Medical Oncology and Hematology [facility] [address] [place], [place] [address] Tel. [phone]    Fax. [phone]

## 2017-10-12 NOTE — Patient Instructions (Signed)
Implanted Port Home Guide An implanted port is a type of central line that is placed under the skin. Central lines are used to provide IV access when treatment or nutrition needs to be given through a person's veins. Implanted ports are used for long-term IV access. An implanted port may be placed because:  You need IV medicine that would be irritating to the small veins in your hands or arms.  You need long-term IV medicines, such as antibiotics.  You need IV nutrition for a long period.  You need frequent blood draws for lab tests.  You need dialysis.  Implanted ports are usually placed in the chest area, but they can also be placed in the upper arm, the abdomen, or the leg. An implanted port has two main parts:  Reservoir. The reservoir is round and will appear as a small, raised area under your skin. The reservoir is the part where a needle is inserted to give medicines or draw blood.  Catheter. The catheter is a thin, flexible tube that extends from the reservoir. The catheter is placed into a large vein. Medicine that is inserted into the reservoir goes into the catheter and then into the vein.  How will I care for my incision site? Do not get the incision site wet. Bathe or shower as directed by your health care provider. How is my port accessed? Special steps must be taken to access the port:  Before the port is accessed, a numbing cream can be placed on the skin. This helps numb the skin over the port site.  Your health care provider uses a sterile technique to access the port. ? Your health care provider must put on a mask and sterile gloves. ? The skin over your port is cleaned carefully with an antiseptic and allowed to dry. ? The port is gently pinched between sterile gloves, and a needle is inserted into the port.  Only "non-coring" port needles should be used to access the port. Once the port is accessed, a blood return should be checked. This helps ensure that the port  is in the vein and is not clogged.  If your port needs to remain accessed for a constant infusion, a clear (transparent) bandage will be placed over the needle site. The bandage and needle will need to be changed every week, or as directed by your health care provider.  Keep the bandage covering the needle clean and dry. Do not get it wet. Follow your health care provider's instructions on how to take a shower or bath while the port is accessed.  If your port does not need to stay accessed, no bandage is needed over the port.  What is flushing? Flushing helps keep the port from getting clogged. Follow your health care provider's instructions on how and when to flush the port. Ports are usually flushed with saline solution or a medicine called heparin. The need for flushing will depend on how the port is used.  If the port is used for intermittent medicines or blood draws, the port will need to be flushed: ? After medicines have been given. ? After blood has been drawn. ? As part of routine maintenance.  If a constant infusion is running, the port may not need to be flushed.  How long will my port stay implanted? The port can stay in for as long as your health care provider thinks it is needed. When it is time for the port to come out, surgery will be   done to remove it. The procedure is similar to the one performed when the port was put in. When should I seek immediate medical care? When you have an implanted port, you should seek immediate medical care if:  You notice a bad smell coming from the incision site.  You have swelling, redness, or drainage at the incision site.  You have more swelling or pain at the port site or the surrounding area.  You have a fever that is not controlled with medicine.  This information is not intended to replace advice given to you by your health care provider. Make sure you discuss any questions you have with your health care provider. Document  Released: 06/01/2005 Document Revised: 11/07/2015 Document Reviewed: 02/06/2013 Elsevier Interactive Patient Education  2017 Elsevier Inc.  

## 2017-10-12 NOTE — Progress Notes (Signed)
OK to treat per MD Magrinat with labs today.

## 2017-10-12 NOTE — Patient Instructions (Signed)
[facility] Discharge Instructions for Patients Receiving Chemotherapy  Today you received the following chemotherapy agents Kadcyla  To help prevent nausea and vomiting after your treatment, we encourage you to take your nausea medication as directed   If you develop nausea and vomiting that is not controlled by your nausea medication, call the clinic.   BELOW ARE SYMPTOMS THAT SHOULD BE REPORTED IMMEDIATELY:  *FEVER GREATER THAN 100.5 F  *CHILLS WITH OR WITHOUT FEVER  NAUSEA AND VOMITING THAT IS NOT CONTROLLED WITH YOUR NAUSEA MEDICATION  *UNUSUAL SHORTNESS OF BREATH  *UNUSUAL BRUISING OR BLEEDING  TENDERNESS IN MOUTH AND THROAT WITH OR WITHOUT PRESENCE OF ULCERS  *URINARY PROBLEMS  *BOWEL PROBLEMS  UNUSUAL RASH Items with * indicate a potential emergency and should be followed up as soon as possible.  Feel free to call the clinic should you have any questions or concerns. The clinic phone number is [phone].  Please show the CHEMO ALERT CARD at check-in to the Emergency Department and triage nurse.   

## 2017-10-13 ENCOUNTER — Ambulatory Visit: Payer: Medicare Other | Admitting: Adult Health

## 2017-10-13 ENCOUNTER — Telehealth: Payer: Self-pay

## 2017-10-13 ENCOUNTER — Other Ambulatory Visit: Payer: Medicare Other

## 2017-10-13 ENCOUNTER — Ambulatory Visit: Payer: Medicare Other

## 2017-10-13 NOTE — Telephone Encounter (Signed)
Called to verify appointment time and review her lab results. Per 5/1 los

## 2017-10-14 LAB — VON WILLEBRAND PANEL
COAGULATION FACTOR VIII: 200 % — AB (ref 57–163)
Ristocetin Co-factor, Plasma: 221 % — ABNORMAL HIGH (ref 50–200)
Von Willebrand Antigen, Plasma: 346 % — ABNORMAL HIGH (ref 50–200)

## 2017-10-14 LAB — COAG STUDIES INTERP REPORT

## 2017-10-18 ENCOUNTER — Encounter: Payer: Self-pay | Admitting: Oncology

## 2017-10-20 ENCOUNTER — Inpatient Hospital Stay: Payer: Medicare Other

## 2017-10-20 ENCOUNTER — Inpatient Hospital Stay (HOSPITAL_BASED_OUTPATIENT_CLINIC_OR_DEPARTMENT_OTHER): Payer: Medicare Other | Admitting: Adult Health

## 2017-10-20 ENCOUNTER — Other Ambulatory Visit: Payer: Medicare Other

## 2017-10-20 ENCOUNTER — Telehealth: Payer: Self-pay

## 2017-10-20 ENCOUNTER — Telehealth: Payer: Self-pay | Admitting: Oncology

## 2017-10-20 ENCOUNTER — Inpatient Hospital Stay: Payer: Medicare Other | Attending: Oncology

## 2017-10-20 ENCOUNTER — Ambulatory Visit: Payer: Medicare Other | Admitting: Adult Health

## 2017-10-20 ENCOUNTER — Encounter: Payer: Self-pay | Admitting: Adult Health

## 2017-10-20 VITALS — BP 159/73 | HR 93 | Temp 98.6°F | Resp 17 | Ht 66.5 in | Wt 232.8 lb

## 2017-10-20 DIAGNOSIS — E109 Type 1 diabetes mellitus without complications: Secondary | ICD-10-CM

## 2017-10-20 DIAGNOSIS — Z794 Long term (current) use of insulin: Secondary | ICD-10-CM

## 2017-10-20 DIAGNOSIS — Z803 Family history of malignant neoplasm of breast: Secondary | ICD-10-CM | POA: Insufficient documentation

## 2017-10-20 DIAGNOSIS — Z170 Estrogen receptor positive status [ER+]: Secondary | ICD-10-CM

## 2017-10-20 DIAGNOSIS — C7801 Secondary malignant neoplasm of right lung: Secondary | ICD-10-CM

## 2017-10-20 DIAGNOSIS — Z5111 Encounter for antineoplastic chemotherapy: Secondary | ICD-10-CM | POA: Insufficient documentation

## 2017-10-20 DIAGNOSIS — C7802 Secondary malignant neoplasm of left lung: Secondary | ICD-10-CM | POA: Insufficient documentation

## 2017-10-20 DIAGNOSIS — C7951 Secondary malignant neoplasm of bone: Secondary | ICD-10-CM

## 2017-10-20 DIAGNOSIS — C50212 Malignant neoplasm of upper-inner quadrant of left female breast: Secondary | ICD-10-CM

## 2017-10-20 DIAGNOSIS — I10 Essential (primary) hypertension: Secondary | ICD-10-CM

## 2017-10-20 DIAGNOSIS — Z5112 Encounter for antineoplastic immunotherapy: Secondary | ICD-10-CM | POA: Insufficient documentation

## 2017-10-20 DIAGNOSIS — Z853 Personal history of malignant neoplasm of breast: Secondary | ICD-10-CM | POA: Diagnosis not present

## 2017-10-20 DIAGNOSIS — Z95828 Presence of other vascular implants and grafts: Secondary | ICD-10-CM

## 2017-10-20 DIAGNOSIS — Z79899 Other long term (current) drug therapy: Secondary | ICD-10-CM | POA: Insufficient documentation

## 2017-10-20 DIAGNOSIS — C50912 Malignant neoplasm of unspecified site of left female breast: Secondary | ICD-10-CM | POA: Diagnosis not present

## 2017-10-20 LAB — CMP (CANCER CENTER ONLY)
ALT: 28 U/L (ref 0–55)
AST: 145 U/L — ABNORMAL HIGH (ref 5–34)
Albumin: 3 g/dL — ABNORMAL LOW (ref 3.5–5.0)
Alkaline Phosphatase: 126 U/L (ref 40–150)
Anion gap: 7 (ref 3–11)
BUN: 5 mg/dL — ABNORMAL LOW (ref 7–26)
CO2: 29 mmol/L (ref 22–29)
Calcium: 9.3 mg/dL (ref 8.4–10.4)
Chloride: 104 mmol/L (ref 98–109)
Creatinine: 0.74 mg/dL (ref 0.60–1.10)
GFR, Est AFR Am: >60 mL/min
GFR, Estimated: >60 mL/min
Glucose, Bld: 100 mg/dL (ref 70–140)
Potassium: 3.2 mmol/L — ABNORMAL LOW (ref 3.5–5.1)
Sodium: 140 mmol/L (ref 136–145)
Total Bilirubin: 0.7 mg/dL (ref 0.2–1.2)
Total Protein: 7.6 g/dL (ref 6.4–8.3)

## 2017-10-20 LAB — CBC WITH DIFFERENTIAL (CANCER CENTER ONLY)
Basophils Absolute: 0.1 K/uL (ref 0.0–0.1)
Basophils Relative: 1 %
Eosinophils Absolute: 0.2 K/uL (ref 0.0–0.5)
Eosinophils Relative: 3 %
HCT: 35.1 % (ref 34.8–46.6)
Hemoglobin: 11.3 g/dL — ABNORMAL LOW (ref 11.6–15.9)
Lymphocytes Relative: 32 %
Lymphs Abs: 1.5 K/uL (ref 0.9–3.3)
MCH: 28.4 pg (ref 25.1–34.0)
MCHC: 32.2 g/dL (ref 31.5–36.0)
MCV: 88.2 fL (ref 79.5–101.0)
Monocytes Absolute: 0.9 K/uL (ref 0.1–0.9)
Monocytes Relative: 19 %
Neutro Abs: 2.1 K/uL (ref 1.5–6.5)
Neutrophils Relative %: 45 %
Platelet Count: 124 K/uL — ABNORMAL LOW (ref 145–400)
RBC: 3.98 MIL/uL (ref 3.70–5.45)
RDW: 15.7 % — ABNORMAL HIGH (ref 11.2–14.5)
WBC Count: 4.8 K/uL (ref 3.9–10.3)

## 2017-10-20 MED ORDER — TRIAMTERENE-HCTZ 37.5-25 MG PO CAPS: 1.0000 | ORAL_CAPSULE | Freq: Every day | ORAL | 0 refills | Status: AC

## 2017-10-20 MED ORDER — HEPARIN SOD (PORK) LOCK FLUSH 100 UNIT/ML IV SOLN
500.0000 [IU] | Freq: Once | INTRAVENOUS | Status: AC
  Administered 2017-10-20: 500 [IU]
  Filled 2017-10-20: qty 5

## 2017-10-20 MED ORDER — SODIUM CHLORIDE 0.9% FLUSH
10.0000 mL | Freq: Once | INTRAVENOUS | Status: AC
  Administered 2017-10-20: 10 mL
  Filled 2017-10-20: qty 10

## 2017-10-20 MED ORDER — TRAMADOL HCL 50 MG PO TABS: 50.0000 mg | ORAL_TABLET | Freq: Four times a day (QID) | ORAL | 0 refills | Status: DC | PRN

## 2017-10-20 NOTE — Telephone Encounter (Signed)
Called in Tramadol prescription for pt to [facility] per Wilber Bihari NP 's request.

## 2017-10-20 NOTE — Progress Notes (Addendum)
[place]  [phone] [phone]     ID: Tracy Howell DOB: [birth year]  MR#: [id number]  TGG#:[id number]  Patient Care Team: Lucianne Lei, MD as PCP - General (Family Medicine) Alphonsa Overall, MD as Consulting Physician (General Surgery) Magrinat, Virgie Dad, MD as Consulting Physician (Oncology) Eppie Gibson, MD as Attending Physician (Radiation Oncology) Juanita Craver, MD as Consulting Physician (Gastroenterology) OTHER MD:  CHIEF COMPLAINT: triple positive bilateral breast cancer  CURRENT TREATMENT: T-DM1   HISTORY OF CURRENT ILLNESS: From the original intake note:  "Tracy Howell" tells me she underwent left lumpectomy in [place] on 2012 for a 2.5 cm, grade 2 breast cancer involving one lymph node of 5 sampled. [ It may have been only 2 lymph nodes that were removed she says.]  This was at the [facility], currently the [place] [facility] on [address].  The patient says she was told could have more treatment if she wanted it.  She did not see any sense in it.  She feels her left breast cancer was caused by the fact that she kept her iPhone in her bra right by the left breast.  Sometime in April 2018 she noted a new mass in the left breast. She did not immediately bring it to medical attention but as it continued to grow she mentioned her to her primary care physician and on 03/29/2017 Tracy Howell underwent bilateral diagnostic mammography with tomography and left breast ultrasonography at [facility].  The breast density was category B.  In the left breast central to the nipple there was a 6 cm irregular mass with indistinct margins and heterogeneous calcifications.  By ultrasound this measured 5 cm, with indistinct margins, at the 12:00 anterior area.  There was associated edema.  The left axilla was sonographically benign.  Biopsy of the left breast area in question March 30, 2017 showed ([id number])  invasive ductal carcinoma, grade 3, estrogen receptor 100% positive, progesterone receptor 60% positive, both with strong staining intensity, HER-2 amplified, with a signals ratio of 3.38, and the number per cell 7.95.  The  MIB-1 was 70%.  The patient's subsequent history is as detailed below.  INTERVAL HISTORY: Tracy Howell returns today for further evaluation and treatment of her triple positive breast cancer accompanied by her husband. She receives Kadcyla every 21 days, most recently last week.  She is here today to review her progressive disease with myself and Dr. Jana Hakim to come up with a plan.  She meets with Dr. Lucia Gaskins today at noon.    REVIEW OF SYSTEMS: Tracy Howell is doing well today. She has noted an increase in blood clots in her mouth and nose.  She says that she will wake up and they will be bloody, she also notes occasional bloody nasal drainage.  She also has had blood streaks in her stool.  Her breast continues to be hardened, but otherwise, she is doing well.  She has been on a road trip with her husband.  A detailed ROS was otherwise non contributory.      PAST MEDICAL HISTORY: Past Medical History:  Diagnosis Date  . Angioedema   . Breast cancer ([place]) 2013  . Complication of anesthesia    slow to wake up   . Pneumonia    hx of x 2   . recurrent left breast ca dx'd 04/2017  . SBO (small bowel obstruction) ([place]) 08/13/2014    PAST SURGICAL HISTORY: Past Surgical History:  Procedure Laterality Date  .  ABDOMINAL HYSTERECTOMY    . APPENDECTOMY    . BREAST LUMPECTOMY  2013  . BREAST SURGERY    . CESAREAN SECTION     x2  . CHOLECYSTECTOMY    . PORTACATH PLACEMENT Right 05/17/2017   Procedure: INSERTION PORT-A-CATH right subclavian vein;  Surgeon: Alphonsa Overall, MD;  Location: [facility];  Service: General;  Laterality: Right;    FAMILY HISTORY Family History  Problem Relation Age of Onset  . Angioedema Mother   . Breast cancer Mother   . Breast cancer Maternal Aunt     The patient was adopted.  Her adoptive parents died in their 77s.  The patient's birth mother however died in her 45s from breast cancer.  The patient believes she has 2 brothers is not sure about sisters.  However she does have 2 maternal aunts who had breast cancer.  There is no history of ovarian cancer in the family to her knowledge.  The patient has no information regarding her father or his side of the family.   GYNECOLOGIC HISTORY:  No LMP recorded. Patient has had a hysterectomy. Menarche age 73, first live birth age 81, she is [place] P2.  She underwent hysterectomy at age 47.  She tells me they left a fourth of an ovary at that time.  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  She actually lives in [place], [place], and spends 2 weeks here every 2 months at her job.  When she is in town she actually lives with Dr. Criss Rosales. 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  She recently married Tracy Howell who is a Camera operator.  He has 2 children of his own, in [place] and [place].  The patient's own children are Tracy Howell who lives in [place] and works in Engineer, technical sales, and [employer] lives in [place] and owns a [place].  The patient has no grandchildren.  She is 1/7-day [facility].  (She tells me her religion has no bands on any medical treatments but she cannot have alcohol, cigarettes, or pork).   ADVANCED DIRECTIVES:    HEALTH MAINTENANCE: Social History   Tobacco Use  . Smoking status: Never Smoker  . Smokeless tobacco: Never Used  Substance Use Topics  . Alcohol use: No  . Drug use: No     Colonoscopy:  PAP:  Bone density:   Allergies  Allergen Reactions  . Onion Anaphylaxis, Nausea And Vomiting and Swelling    Throat swelling.  . Other Anaphylaxis and Nausea And  Vomiting    Green Peppers-anaphylactic, nausea/vomiting/swelling  Mushrooms-nausea/vomiting.  Patient has hereditary angioedema (HAE)  . Aleve [Naproxen] Other (See Comments)    bleeding  . Codeine Nausea And Vomiting  . Morphine And Related Nausea And Vomiting  . Penicillins Rash    Vomiting Has patient had a PCN reaction causing immediate rash, facial/tongue/throat swelling, SOB or lightheadedness with hypotension: Yes Has patient had a PCN reaction causing severe rash involving mucus membranes or skin necrosis: Yes Has patient had a PCN reaction that required hospitalization:Was inpatient when reaction occurred. Has patient had a PCN reaction occurring within the last 10 years: No If all of the above answers are "NO", then may proceed with Cephalosporin use.     Current Outpatient Medications  Medication Sig Dispense Refill  .  anastrozole (ARIMIDEX) 1 MG tablet Take 1 tablet (1 mg total) by mouth daily. (Patient not taking: Reported on 09/23/2017) 90 tablet 4  . Ascorbic Acid (VITAMIN C PO) Take 1 tablet by mouth daily.    . B Complex-C (B-COMPLEX WITH VITAMIN C) tablet Take 1 tablet by mouth daily.    . COD LIVER OIL PO Take 1 capsule by mouth daily.    Marland Kitchen lidocaine-prilocaine (EMLA) cream Apply 1 application topically as needed. 30 g 0  . losartan (COZAAR) 25 MG tablet Take 1 tablet (25 mg total) by mouth daily. 30 tablet 6  . Multiple Vitamin (MULTIVITAMIN WITH MINERALS) TABS tablet Take 1 tablet by mouth daily.     No current facility-administered medications for this visit.     OBJECTIVE:   Vitals:   10/20/17 0924  BP: (!) 159/73  Pulse: 93  Resp: 17  Temp: 98.6 F (37 C)  SpO2: 97%     Body mass index is 37.01 kg/m.   Wt Readings from Last 3 Encounters:  10/20/17 232 lb 12.8 oz (105.6 kg)  10/12/17 234 lb (106.1 kg)  09/23/17 234 lb 12.8 oz (106.5 kg)  ECOG FS:1 - Symptomatic but completely ambulatory GENERAL: Patient is a well appearing female in no acute  distress HEENT:  Sclerae anicteric.  Oropharynx clear and moist. No ulcerations or evidence of oropharyngeal candidiasis. Neck is supple.  NODES:  No cervical, supraclavicular, or axillary lymphadenopathy palpated.  BREAST EXAM:  Breast tumor is more erythematous and harder.   LUNGS:  Clear to auscultation bilaterally.  No wheezes or rhonchi. HEART:  Regular rate and rhythm. No murmur appreciated. ABDOMEN:  Soft, nontender.  Positive, normoactive bowel sounds. No organomegaly palpated. MSK:  No focal spinal tenderness to palpation. Full range of motion bilaterally in the upper extremities. EXTREMITIES:  No peripheral edema.   SKIN:  Clear with no obvious rashes or skin changes. No nail dyscrasia. NEURO:  Nonfocal. Well oriented.  Appropriate affect.    LAB RESULTS:  CMP     Component Value Date/Time   NA 139 10/12/2017 0829   NA 142 05/12/2017 0808   K 3.6 10/12/2017 0829   K 3.5 05/12/2017 0808   CL 105 10/12/2017 0829   CO2 26 10/12/2017 0829   CO2 26 05/12/2017 0808   GLUCOSE 90 10/12/2017 0829   GLUCOSE 133 05/12/2017 0808   BUN 7 10/12/2017 0829   BUN 9.8 05/12/2017 0808   CREATININE 0.72 10/12/2017 0829   CREATININE 0.9 05/12/2017 0808   CALCIUM 9.5 10/12/2017 0829   CALCIUM 9.3 05/12/2017 0808   PROT 7.9 10/12/2017 0829   PROT 7.3 05/12/2017 0808   ALBUMIN 3.2 (L) 10/12/2017 0829   ALBUMIN 3.3 (L) 05/12/2017 0808   AST 86 (H) 10/12/2017 0829   AST 15 05/12/2017 0808   ALT 39 10/12/2017 0829   ALT 10 05/12/2017 0808   ALKPHOS 120 10/12/2017 0829   ALKPHOS 90 05/12/2017 0808   BILITOT 0.6 10/12/2017 0829   BILITOT 0.56 05/12/2017 0808   GFRNONAA >60 10/12/2017 0829   GFRAA >60 10/12/2017 0829    No results found for: TOTALPROTELP, ALBUMINELP, A1GS, A2GS, BETS, BETA2SER, GAMS, MSPIKE, SPEI  No results found for: KPAFRELGTCHN, LAMBDASER, KAPLAMBRATIO  Lab Results  Component Value Date   WBC 4.8 10/20/2017   NEUTROABS 2.1 10/20/2017   HGB 11.3 (L)  10/20/2017   HCT 35.1 10/20/2017   MCV 88.2 10/20/2017   PLT 124 (L) 10/20/2017      Chemistry  Component Value Date/Time   NA 139 10/12/2017 0829   NA 142 05/12/2017 0808   K 3.6 10/12/2017 0829   K 3.5 05/12/2017 0808   CL 105 10/12/2017 0829   CO2 26 10/12/2017 0829   CO2 26 05/12/2017 0808   BUN 7 10/12/2017 0829   BUN 9.8 05/12/2017 0808   CREATININE 0.72 10/12/2017 0829   CREATININE 0.9 05/12/2017 0808      Component Value Date/Time   CALCIUM 9.5 10/12/2017 0829   CALCIUM 9.3 05/12/2017 0808   ALKPHOS 120 10/12/2017 0829   ALKPHOS 90 05/12/2017 0808   AST 86 (H) 10/12/2017 0829   AST 15 05/12/2017 0808   ALT 39 10/12/2017 0829   ALT 10 05/12/2017 0808   BILITOT 0.6 10/12/2017 0829   BILITOT 0.56 05/12/2017 0808       No results found for: LABCA2  No components found for: RWERXV400  No results for input(s): INR in the last 168 hours.  No results found for: LABCA2  No results found for: CAN199  No results found for: QQP619  No results found for: JKD326  Lab Results  Component Value Date   CA2729 22.0 08/31/2017    No components found for: HGQUANT  No results found for: CEA1 / No results found for: CEA1   No results found for: AFPTUMOR  No results found for: [place]  No results found for: PSA1  Appointment on 10/20/2017  Component Date Value Ref Range Status  . WBC Count 10/20/2017 4.8  3.9 - 10.3 K/uL Final  . RBC 10/20/2017 3.98  3.70 - 5.45 MIL/uL Final  . Hemoglobin 10/20/2017 11.3* 11.6 - 15.9 g/dL Final  . HCT 10/20/2017 35.1  34.8 - 46.6 % Final  . MCV 10/20/2017 88.2  79.5 - 101.0 fL Final  . MCH 10/20/2017 28.4  25.1 - 34.0 pg Final  . MCHC 10/20/2017 32.2  31.5 - 36.0 g/dL Final  . RDW 10/20/2017 15.7* 11.2 - 14.5 % Final  . Platelet Count 10/20/2017 124* 145 - 400 K/uL Final  . Neutrophils Relative % 10/20/2017 45  % Final  . Neutro Abs 10/20/2017 2.1  1.5 - 6.5 K/uL Final  . Lymphocytes Relative 10/20/2017 32  % Final   . Lymphs Abs 10/20/2017 1.5  0.9 - 3.3 K/uL Final  . Monocytes Relative 10/20/2017 19  % Final  . Monocytes Absolute 10/20/2017 0.9  0.1 - 0.9 K/uL Final  . Eosinophils Relative 10/20/2017 3  % Final  . Eosinophils Absolute 10/20/2017 0.2  0.0 - 0.5 K/uL Final  . Basophils Relative 10/20/2017 1  % Final  . Basophils Absolute 10/20/2017 0.1  0.0 - 0.1 K/uL Final   Performed at [facility], [address] [place], [place] [address]    (this displays the last labs from the last 3 days)  No results found for: TOTALPROTELP, ALBUMINELP, A1GS, A2GS, BETS, BETA2SER, GAMS, MSPIKE, SPEI (this displays SPEP labs)  No results found for: KPAFRELGTCHN, LAMBDASER, KAPLAMBRATIO (kappa/lambda light chains)  No results found for: HGBA, HGBA2QUANT, HGBFQUANT, HGBSQUAN (Hemoglobinopathy evaluation)   No results found for: LDH  No results found for: IRON, TIBC, IRONPCTSAT (Iron and TIBC)  No results found for: FERRITIN  Urinalysis    Component Value Date/Time   COLORURINE YELLOW 08/10/2017 [place] 08/10/2017 0924   LABSPEC 1.010 08/10/2017 0924   PHURINE 6.5 08/10/2017 [place] 08/10/2017 0924   HGBUR NEGATIVE 08/10/2017 [place] 08/10/2017 8099  KETONESUR NEGATIVE 08/10/2017 0924   PROTEINUR NEGATIVE 08/10/2017 0924   NITRITE NEGATIVE 08/10/2017 0924   LEUKOCYTESUR SMALL (A) 08/10/2017 0924     STUDIES: Repeat echocardiogram scheduled for 09/23/2017  ELIGIBLE FOR AVAILABLE RESEARCH PROTOCOL:no  ASSESSMENT: 72 y.o. [place] woman (primarily residing in [place])  (1) status post left lumpectomy in 2012 for a reported pT2 pN1 breast cancer, the patient refusing adjuvant treatment (no chemotherapy, radiation, or antiestrogens).  METASTATIC DISEASE: December 2018 (bilateral triple positive disease) (2) status post left breast biopsy 03/30/2017 for a clinical T4 N0, stage IIIB invasive ductal carcinoma,  grade 3, triple positive, with an MIB-1 of 70%  (a) staging studies 05/21/2017 show multiple bilateral pulmonary nodules consistent with stage IV disease 05/21/2017; there are no liver or bone lesions noted  (b) breast MRI 05/13/2017 shows a 7 cm area of non-masslike enhancement in the right breast, with biopsy ([id number]) on 06/24/2017 showing: Metastatic carcinoma. Prognostic panel was strongly ER and PR positive as well as HER-2 positive with the signals ratio of 2.61 and number per cell 5.35 (triple positive)  (3) neoadjuvant chemotherapy with T-DM1 started 06/29/2016  (a) echocardiogram 06/24/2017 showed an ejection fraction of 60-65 %  (B) echocardiogram 09/23/2017 showed an ejection fraction of 65-70%  (c) CT chest on 10/12/2017 shows progression in nodes, pulmonary nodules, and bone mets  (4) definitive surgery to follow once left chest wall disease under better control  (5) anastrozole written on 09/21/2017, never started by patient  (a) patient states was unable to tolerate tamoxifen in the past (nausea).  (6) genetics testing pending  (7) chemotherapy with carboplatin, gemcitabine, and trastuzumab to start 11/17/2017, to be repeated every 21 days x 6, or to optimal response  PLAN:  Tracy Howell is doing moderately well today.  She met with myself and Dr. Jana Hakim to review her scans and for treatment recommendations.  Dr. Jana Hakim examined Tracy Howell's breast.  He thinks that she will not be able to have surgery, and that Dr. Lucia Gaskins will want [place] to get better control of her cancer before surgery.  Tracy Howell desires surgery first, however if this is not possible, we will see her back for different treatment.  Dr. Jana Hakim reviewed that he would like her to start next week.  She would like to wait until 11/17/2017 to start treatment with Gemcitabine, Carboplatin, and Herceptin.  We will await her appointment with Dr. Lucia Gaskins and his decision.  She is tentatively scheduled for June 5 to start  treatment.  Tracy Howell knows to call for any questions or concerns prior to her next appointment with [place].     Wilber Bihari, NP  10/20/17 9:39 AM Medical Oncology and Hematology [facility] [address] [place], [place] [address] Tel. [phone]    Fax. [phone]   ADDENDUM: I met with Tracy Howell and her husband today going over her complex situation.  At the time of this addendum she has already met with her surgeon, Dr. Lucia Gaskins, and he has advised her that she is not a candidate for a left mastectomy at present but needs to get back to chemotherapy ASAP.  I concur with that assessment  On the day of this meeting, I discussed with the patient treatment with carboplatin, gemcitabine, and trastuzumab.  I am holding Pertuzumab at this point because I am concerned the patient may not be able to tolerate even the moderate problem with diarrhea that frequently occurs with that agent.  The goal of treatment is to shrink the left chest wall  tumor sufficiently that she can undergo the bilateral mastectomies that is her goal.  We discussed in detail the possible toxicities, side effects and complications of these agents and we have offered to move her chemotherapy up to next week.  At this point she is not ready to be treated before 11/17/2017.  I suggested to Tracy Howell that she might want to be treated closer to home so that she does not have to make these long trips, also so that if she develops side effects someone is right there who knows her, and finally for better continuity of care.  At this point she wants to continue to be treated here but still live in [place].  If she agrees to move up her chemotherapy date we will be glad to set that up for her.  I personally saw this patient and performed a substantive portion of this encounter with the listed APP documented above.   Chauncey Cruel, MD Medical Oncology and Hematology [facility] [place] [address] [place], [place] [address] Tel. [phone]    Fax. [phone]

## 2017-10-20 NOTE — Telephone Encounter (Addendum)
Per message below, tried to contact pt, no answer, left VM.  Will mail her a potassium rich diet sheet.    ----- Message from Gardenia Phlegm, NP sent at 10/20/2017 11:38 AM EDT ----- Patient potassium level is slightly low at 3.2.  Please encourage her to eat a potassium rich diet.   ----- Message ----- From: Interface, Lab In [place] Sent: 10/20/2017   9:25 AM To: Gardenia Phlegm, NP

## 2017-10-20 NOTE — Telephone Encounter (Signed)
Patient decline avs and calendar °

## 2017-10-21 ENCOUNTER — Telehealth: Payer: Self-pay | Admitting: Adult Health

## 2017-10-21 NOTE — Telephone Encounter (Signed)
Called and [facility] for patient to call back to review how her appointment went with Dr. Lucia Gaskins yesterday.  I aksed her to return my call.    Wilber Bihari, NP

## 2017-10-22 ENCOUNTER — Telehealth: Payer: Self-pay | Admitting: Adult Health

## 2017-10-22 NOTE — Telephone Encounter (Signed)
Called patient and [facility] for her to call us back at the cancer center regarding scheduling her potentially coming in for treatment next week.    Wilber Bihari, NP

## 2017-10-22 NOTE — Telephone Encounter (Signed)
Received call back from [place] about her chemotherapy.  We reviewed that we would like her to start treatment next week.  She told me she would return on 5/21.  She would like to know what to do about her nose bleeds.  I recommended saline nasal spray.  If they continue she may need ENT referral.  She will call us and let us know.  Dr. Jana Hakim and Dr. Lucia Gaskins are aware.    Wilber Bihari, NP

## 2017-10-27 ENCOUNTER — Other Ambulatory Visit: Payer: Self-pay | Admitting: Oncology

## 2017-10-27 ENCOUNTER — Telehealth: Payer: Self-pay | Admitting: *Deleted

## 2017-10-27 DIAGNOSIS — C50212 Malignant neoplasm of upper-inner quadrant of left female breast: Secondary | ICD-10-CM

## 2017-10-27 DIAGNOSIS — Z170 Estrogen receptor positive status [ER+]: Principal | ICD-10-CM

## 2017-10-27 MED ORDER — PROCHLORPERAZINE MALEATE 10 MG PO TABS: 10.0000 mg | ORAL_TABLET | Freq: Four times a day (QID) | ORAL | 1 refills | Status: DC | PRN

## 2017-10-27 MED ORDER — DEXAMETHASONE 4 MG PO TABS: 8.0000 mg | ORAL_TABLET | Freq: Every day | ORAL | 1 refills | Status: DC

## 2017-10-27 MED ORDER — LORAZEPAM 0.5 MG PO TABS: 0.5000 mg | ORAL_TABLET | Freq: Every evening | ORAL | 0 refills | Status: DC | PRN

## 2017-10-27 MED ORDER — LIDOCAINE-PRILOCAINE 2.5-2.5 % EX CREA: TOPICAL_CREAM | CUTANEOUS | 3 refills | Status: DC

## 2017-10-27 NOTE — Progress Notes (Signed)
DISCONTINUE OFF PATHWAY REGIMEN - Breast   [id number]:Paclitaxel + Trastuzumab:   A cycle is every 28 days:     Paclitaxel      Trastuzumab      Trastuzumab   **Always confirm dose/schedule in your pharmacy ordering system**    REASON: Other Reason PRIOR TREATMENT: Off Pathway: Paclitaxel + Trastuzumab TREATMENT RESPONSE: Unable to Evaluate  START OFF PATHWAY REGIMEN - Breast   [id number]:Gemcitabine + Carboplatin (1000/2) q21 Days:   A cycle is every 21 days:     Gemcitabine      Carboplatin   **Always confirm dose/schedule in your pharmacy ordering system**    Patient Characteristics: Preoperative or Nonsurgical Candidate (Clinical Staging), Neoadjuvant Therapy followed by Surgery, Invasive Disease, Chemotherapy, HER2 Positive, ER Positive Therapeutic Status: Preoperative or Nonsurgical Candidate (Clinical Staging) AJCC M Category: cM0 AJCC Grade: G3 Breast Surgical Plan: Neoadjuvant Therapy followed by Surgery ER Status: Positive (+) AJCC 8 Stage Grouping: IIIB HER2 Status: Positive (+) AJCC T Category: cT4 AJCC N Category: cN0 PR Status: Positive (+) Intent of Therapy: Curative Intent, Discussed with Patient

## 2017-10-27 NOTE — Progress Notes (Signed)
DISCONTINUE OFF PATHWAY REGIMEN - Breast   [id number]:Gemcitabine + Carboplatin (1000/2) q21 Days:   A cycle is every 21 days:     Gemcitabine      Carboplatin   **Always confirm dose/schedule in your pharmacy ordering system**    REASON: Other Reason PRIOR TREATMENT: Off Pathway: Gemcitabine + Carboplatin (1000/2) q21 Days TREATMENT RESPONSE: Unable to Evaluate  START OFF PATHWAY REGIMEN - Breast   [id number]:Gemcitabine + Carboplatin (1000/2) q21 Days:   A cycle is every 21 days:     Gemcitabine      Carboplatin   **Always confirm dose/schedule in your pharmacy ordering system**    Patient Characteristics: Preoperative or Nonsurgical Candidate (Clinical Staging), Neoadjuvant Therapy followed by Surgery, Invasive Disease, Chemotherapy, HER2 Positive, ER Positive Therapeutic Status: Preoperative or Nonsurgical Candidate (Clinical Staging) AJCC M Category: cM0 AJCC Grade: G3 Breast Surgical Plan: Neoadjuvant Therapy followed by Surgery ER Status: Positive (+) AJCC 8 Stage Grouping: IIIB HER2 Status: Positive (+) AJCC T Category: cT4 AJCC N Category: cN0 PR Status: Positive (+) Intent of Therapy: Non-Curative / Palliative Intent, Discussed with Patient

## 2017-10-27 NOTE — Telephone Encounter (Signed)
Received call from pt stating she wanted to speak to [facility] NP to discuss getting her treatment on 11/02/17.  Informed pt that I would send her a message to f/u on this appt.  Call Back # is [phone].  Message to Wilber Bihari NP.

## 2017-10-28 ENCOUNTER — Telehealth: Payer: Self-pay | Admitting: Medical Oncology

## 2017-10-28 ENCOUNTER — Telehealth: Payer: Self-pay | Admitting: Adult Health

## 2017-10-28 ENCOUNTER — Other Ambulatory Visit: Payer: Self-pay | Admitting: Adult Health

## 2017-10-28 NOTE — Telephone Encounter (Signed)
Pt saw surgeon and he recommended she start chemo sooner . Pt stated date of 5/21. She lives in [place] and needs to know in advance of appt. . I told her someone will get back to her.

## 2017-10-28 NOTE — Telephone Encounter (Signed)
Called patient.  Her appointment had not been moved to 5/21, as per 5/10 scheduling message.  I spoke with Ubaldo Glassing about it who is working to get patient in next week for treatment.  I talked to [name] about this and apologized on our behalf for the scheduling delay.

## 2017-10-28 NOTE — Telephone Encounter (Signed)
Left message for patient regarding upcoming May appointments per 5/10 los. Ok per [facility] to see in treatment area.

## 2017-11-01 ENCOUNTER — Encounter: Payer: Self-pay | Admitting: Adult Health

## 2017-11-01 ENCOUNTER — Inpatient Hospital Stay: Payer: Medicare Other

## 2017-11-01 ENCOUNTER — Inpatient Hospital Stay (HOSPITAL_BASED_OUTPATIENT_CLINIC_OR_DEPARTMENT_OTHER): Payer: Medicare Other | Admitting: Adult Health

## 2017-11-01 VITALS — BP 159/77 | HR 78 | Temp 98.5°F | Resp 18 | Ht 66.5 in | Wt 229.0 lb

## 2017-11-01 DIAGNOSIS — Z170 Estrogen receptor positive status [ER+]: Principal | ICD-10-CM

## 2017-11-01 DIAGNOSIS — Z803 Family history of malignant neoplasm of breast: Secondary | ICD-10-CM

## 2017-11-01 DIAGNOSIS — C7951 Secondary malignant neoplasm of bone: Secondary | ICD-10-CM | POA: Diagnosis not present

## 2017-11-01 DIAGNOSIS — Z853 Personal history of malignant neoplasm of breast: Secondary | ICD-10-CM | POA: Diagnosis not present

## 2017-11-01 DIAGNOSIS — C50212 Malignant neoplasm of upper-inner quadrant of left female breast: Secondary | ICD-10-CM

## 2017-11-01 DIAGNOSIS — C7802 Secondary malignant neoplasm of left lung: Secondary | ICD-10-CM

## 2017-11-01 DIAGNOSIS — Z5112 Encounter for antineoplastic immunotherapy: Secondary | ICD-10-CM | POA: Diagnosis not present

## 2017-11-01 DIAGNOSIS — Z5111 Encounter for antineoplastic chemotherapy: Secondary | ICD-10-CM | POA: Diagnosis not present

## 2017-11-01 DIAGNOSIS — Z95828 Presence of other vascular implants and grafts: Secondary | ICD-10-CM

## 2017-11-01 DIAGNOSIS — Z79899 Other long term (current) drug therapy: Secondary | ICD-10-CM | POA: Diagnosis not present

## 2017-11-01 DIAGNOSIS — C7801 Secondary malignant neoplasm of right lung: Secondary | ICD-10-CM | POA: Diagnosis not present

## 2017-11-01 LAB — CBC WITH DIFFERENTIAL (CANCER CENTER ONLY)
BASOS PCT: 1 %
Basophils Absolute: 0.1 10*3/uL (ref 0.0–0.1)
EOS ABS: 0.2 10*3/uL (ref 0.0–0.5)
Eosinophils Relative: 2 %
HCT: 35.1 % (ref 34.8–46.6)
Hemoglobin: 11.6 g/dL (ref 11.6–15.9)
LYMPHS ABS: 2.6 10*3/uL (ref 0.9–3.3)
Lymphocytes Relative: 34 %
MCH: 28.5 pg (ref 25.1–34.0)
MCHC: 33 g/dL (ref 31.5–36.0)
MCV: 86.2 fL (ref 79.5–101.0)
MONO ABS: 1.1 10*3/uL — AB (ref 0.1–0.9)
MONOS PCT: 14 %
Neutro Abs: 3.8 10*3/uL (ref 1.5–6.5)
Neutrophils Relative %: 49 %
Platelet Count: 240 10*3/uL (ref 145–400)
RBC: 4.08 MIL/uL (ref 3.70–5.45)
RDW: 16.6 % — AB (ref 11.2–14.5)
WBC Count: 7.8 10*3/uL (ref 3.9–10.3)

## 2017-11-01 LAB — CMP (CANCER CENTER ONLY)
ALBUMIN: 3.2 g/dL — AB (ref 3.5–5.0)
ALT: 32 U/L (ref 0–55)
AST: 93 U/L — AB (ref 5–34)
Alkaline Phosphatase: 121 U/L (ref 40–150)
Anion gap: 6 (ref 3–11)
BUN: 6 mg/dL — ABNORMAL LOW (ref 7–26)
CALCIUM: 9.3 mg/dL (ref 8.4–10.4)
CO2: 29 mmol/L (ref 22–29)
CREATININE: 0.73 mg/dL (ref 0.60–1.10)
Chloride: 103 mmol/L (ref 98–109)
GFR, Est AFR Am: >60 mL/min (ref >60)
GFR, Estimated: >60 mL/min (ref >60)
GLUCOSE: 72 mg/dL (ref 70–140)
Potassium: 3.6 mmol/L (ref 3.5–5.1)
SODIUM: 138 mmol/L (ref 136–145)
Total Bilirubin: 0.7 mg/dL (ref 0.2–1.2)
Total Protein: 8.2 g/dL (ref 6.4–8.3)

## 2017-11-01 MED ORDER — SODIUM CHLORIDE 0.9 % IV SOLN
214.6000 mg | Freq: Once | INTRAVENOUS | Status: AC
  Administered 2017-11-01: 210 mg via INTRAVENOUS
  Filled 2017-11-01: qty 21

## 2017-11-01 MED ORDER — SODIUM CHLORIDE 0.9 % IV SOLN: 1000.0000 mg/m2 | Freq: Once | INTRAVENOUS | Status: DC

## 2017-11-01 MED ORDER — SODIUM CHLORIDE 0.9% FLUSH
10.0000 mL | INTRAVENOUS | Status: DC | PRN
  Filled 2017-11-01: qty 10

## 2017-11-01 MED ORDER — HEPARIN SOD (PORK) LOCK FLUSH 100 UNIT/ML IV SOLN
500.0000 [IU] | Freq: Once | INTRAVENOUS | Status: DC | PRN
  Filled 2017-11-01: qty 5

## 2017-11-01 MED ORDER — SODIUM CHLORIDE 0.9 % IV SOLN: Freq: Once | INTRAVENOUS | Status: DC

## 2017-11-01 MED ORDER — DEXAMETHASONE SODIUM PHOSPHATE 10 MG/ML IJ SOLN
10.0000 mg | Freq: Once | INTRAMUSCULAR | Status: AC
  Administered 2017-11-01: 10 mg via INTRAVENOUS

## 2017-11-01 MED ORDER — SODIUM CHLORIDE 0.9 % IV SOLN: 10.0000 mg | Freq: Once | INTRAVENOUS | Status: DC

## 2017-11-01 MED ORDER — PALONOSETRON HCL INJECTION 0.25 MG/5ML
INTRAVENOUS | Status: AC
  Filled 2017-11-01: qty 5

## 2017-11-01 MED ORDER — SODIUM CHLORIDE 0.9 % IV SOLN
Freq: Once | INTRAVENOUS | Status: AC
  Administered 2017-11-01: 15:00:00 via INTRAVENOUS

## 2017-11-01 MED ORDER — DIPHENHYDRAMINE HCL 25 MG PO CAPS: 25.0000 mg | ORAL_CAPSULE | Freq: Once | ORAL | Status: DC

## 2017-11-01 MED ORDER — TRASTUZUMAB CHEMO 150 MG IV SOLR: 8.0000 mg/kg | Freq: Once | INTRAVENOUS | Status: DC

## 2017-11-01 MED ORDER — SODIUM CHLORIDE 0.9 % IV SOLN
800.0000 mg/m2 | Freq: Once | INTRAVENOUS | Status: AC
  Administered 2017-11-01: 1786 mg via INTRAVENOUS
  Filled 2017-11-01: qty 46.97

## 2017-11-01 MED ORDER — PALONOSETRON HCL INJECTION 0.25 MG/5ML
0.2500 mg | Freq: Once | INTRAVENOUS | Status: AC
  Administered 2017-11-01: 0.25 mg via INTRAVENOUS

## 2017-11-01 MED ORDER — ACETAMINOPHEN 325 MG PO TABS
650.0000 mg | ORAL_TABLET | Freq: Once | ORAL | Status: DC
Start: 2017-11-01 — End: 2017-11-01

## 2017-11-01 MED ORDER — DEXAMETHASONE SODIUM PHOSPHATE 10 MG/ML IJ SOLN
INTRAMUSCULAR | Status: AC
  Filled 2017-11-01: qty 1

## 2017-11-01 MED ORDER — SODIUM CHLORIDE 0.9% FLUSH
10.0000 mL | Freq: Once | INTRAVENOUS | Status: AC
  Administered 2017-11-01: 10 mL
  Filled 2017-11-01: qty 10

## 2017-11-01 NOTE — Progress Notes (Signed)
[id number]

## 2017-11-01 NOTE — Patient Instructions (Addendum)
[facility] Discharge Instructions for Patients Receiving Chemotherapy  Today you received the following chemotherapy agents Gemzar & Carboplatin.  To help prevent nausea and vomiting after your treatment, we encourage you to take your nausea medication as directed  If you develop nausea and vomiting that is not controlled by your nausea medication, call the clinic.   BELOW ARE SYMPTOMS THAT SHOULD BE REPORTED IMMEDIATELY:  *FEVER GREATER THAN 100.5 F  *CHILLS WITH OR WITHOUT FEVER  NAUSEA AND VOMITING THAT IS NOT CONTROLLED WITH YOUR NAUSEA MEDICATION  *UNUSUAL SHORTNESS OF BREATH  *UNUSUAL BRUISING OR BLEEDING  TENDERNESS IN MOUTH AND THROAT WITH OR WITHOUT PRESENCE OF ULCERS  *URINARY PROBLEMS  *BOWEL PROBLEMS  UNUSUAL RASH Items with * indicate a potential emergency and should be followed up as soon as possible.  Feel free to call the clinic should you have any questions or concerns. The clinic phone number is [phone].  Please show the [facility] at check-in to the Emergency Department and triage nurse.   Carboplatin injection What is this medicine? CARBOPLATIN (KAR boe pla tin) is a chemotherapy drug. It targets fast dividing cells, like cancer cells, and causes these cells to die. This medicine is used to treat ovarian cancer and many other cancers. This medicine may be used for other purposes; ask your health care provider or pharmacist if you have questions. COMMON BRAND NAME(S): Paraplatin What should I tell my health care provider before I take this medicine? They need to know if you have any of these conditions: -blood disorders -hearing problems -kidney disease -recent or ongoing radiation therapy -an unusual or allergic reaction to carboplatin, cisplatin, other chemotherapy, other medicines, foods, dyes, or preservatives -pregnant or trying to get pregnant -breast-feeding How should I use this medicine? This drug is usually  given as an infusion into a vein. It is administered in a hospital or clinic by a specially trained health care professional. Talk to your pediatrician regarding the use of this medicine in children. Special care may be needed. Overdosage: If you think you have taken too much of this medicine contact a poison control center or emergency room at once. NOTE: This medicine is only for you. Do not share this medicine with others. What if I miss a dose? It is important not to miss a dose. Call your doctor or health care professional if you are unable to keep an appointment. What may interact with this medicine? -medicines for seizures -medicines to increase blood counts like filgrastim, pegfilgrastim, sargramostim -some antibiotics like amikacin, gentamicin, neomycin, streptomycin, tobramycin -vaccines Talk to your doctor or health care professional before taking any of these medicines: -acetaminophen -aspirin -ibuprofen -ketoprofen -naproxen This list may not describe all possible interactions. Give your health care provider a list of all the medicines, herbs, non-prescription drugs, or dietary supplements you use. Also tell them if you smoke, drink alcohol, or use illegal drugs. Some items may interact with your medicine. What should I watch for while using this medicine? Your condition will be monitored carefully while you are receiving this medicine. You will need important blood work done while you are taking this medicine. This drug may make you feel generally unwell. This is not uncommon, as chemotherapy can affect healthy cells as well as cancer cells. Report any side effects. Continue your course of treatment even though you feel ill unless your doctor tells you to stop. In some cases, you may be given additional medicines to help with side effects.  Follow all directions for their use. Call your doctor or health care professional for advice if you get a fever, chills or sore throat, or  other symptoms of a cold or flu. Do not treat yourself. This drug decreases your body's ability to fight infections. Try to avoid being around people who are sick. This medicine may increase your risk to bruise or bleed. Call your doctor or health care professional if you notice any unusual bleeding. Be careful brushing and flossing your teeth or using a toothpick because you may get an infection or bleed more easily. If you have any dental work done, tell your dentist you are receiving this medicine. Avoid taking products that contain aspirin, acetaminophen, ibuprofen, naproxen, or ketoprofen unless instructed by your doctor. These medicines may hide a fever. Do not become pregnant while taking this medicine. Women should inform their doctor if they wish to become pregnant or think they might be pregnant. There is a potential for serious side effects to an unborn child. Talk to your health care professional or pharmacist for more information. Do not breast-feed an infant while taking this medicine. What side effects may I notice from receiving this medicine? Side effects that you should report to your doctor or health care professional as soon as possible: -allergic reactions like skin rash, itching or hives, swelling of the face, lips, or tongue -signs of infection - fever or chills, cough, sore throat, pain or difficulty passing urine -signs of decreased platelets or bleeding - bruising, pinpoint red spots on the skin, black, tarry stools, nosebleeds -signs of decreased red blood cells - unusually weak or tired, fainting spells, lightheadedness -breathing problems -changes in hearing -changes in vision -chest pain -high blood pressure -low blood counts - This drug may decrease the number of white blood cells, red blood cells and platelets. You may be at increased risk for infections and bleeding. -nausea and vomiting -pain, swelling, redness or irritation at the injection site -pain, tingling,  numbness in the hands or feet -problems with balance, talking, walking -trouble passing urine or change in the amount of urine Side effects that usually do not require medical attention (report to your doctor or health care professional if they continue or are bothersome): -hair loss -loss of appetite -metallic taste in the mouth or changes in taste This list may not describe all possible side effects. Call your doctor for medical advice about side effects. You may report side effects to FDA at 1-800-FDA-1088. Where should I keep my medicine? This drug is given in a hospital or clinic and will not be stored at home. NOTE: This sheet is a summary. It may not cover all possible information. If you have questions about this medicine, talk to your doctor, pharmacist, or health care provider.  2018 [employer] Standard (2007-09-06 14:38:05)   Gemcitabine injection What is this medicine? GEMCITABINE (jem SIT a been) is a chemotherapy drug. This medicine is used to treat many types of cancer like breast cancer, lung cancer, pancreatic cancer, and ovarian cancer. This medicine may be used for other purposes; ask your health care provider or pharmacist if you have questions. COMMON BRAND NAME(S): Gemzar What should I tell my health care provider before I take this medicine? They need to know if you have any of these conditions: -blood disorders -infection -kidney disease -liver disease -recent or ongoing radiation therapy -an unusual or allergic reaction to gemcitabine, other chemotherapy, other medicines, foods, dyes, or preservatives -pregnant or trying to get pregnant -breast-feeding How  should I use this medicine? This drug is given as an infusion into a vein. It is administered in a hospital or clinic by a specially trained health care professional. Talk to your pediatrician regarding the use of this medicine in children. Special care may be needed. Overdosage: If you think you have  taken too much of this medicine contact a poison control center or emergency room at once. NOTE: This medicine is only for you. Do not share this medicine with others. What if I miss a dose? It is important not to miss your dose. Call your doctor or health care professional if you are unable to keep an appointment. What may interact with this medicine? -medicines to increase blood counts like filgrastim, pegfilgrastim, sargramostim -some other chemotherapy drugs like cisplatin -vaccines Talk to your doctor or health care professional before taking any of these medicines: -acetaminophen -aspirin -ibuprofen -ketoprofen -naproxen This list may not describe all possible interactions. Give your health care provider a list of all the medicines, herbs, non-prescription drugs, or dietary supplements you use. Also tell them if you smoke, drink alcohol, or use illegal drugs. Some items may interact with your medicine. What should I watch for while using this medicine? Visit your doctor for checks on your progress. This drug may make you feel generally unwell. This is not uncommon, as chemotherapy can affect healthy cells as well as cancer cells. Report any side effects. Continue your course of treatment even though you feel ill unless your doctor tells you to stop. In some cases, you may be given additional medicines to help with side effects. Follow all directions for their use. Call your doctor or health care professional for advice if you get a fever, chills or sore throat, or other symptoms of a cold or flu. Do not treat yourself. This drug decreases your body's ability to fight infections. Try to avoid being around people who are sick. This medicine may increase your risk to bruise or bleed. Call your doctor or health care professional if you notice any unusual bleeding. Be careful brushing and flossing your teeth or using a toothpick because you may get an infection or bleed more easily. If you have  any dental work done, tell your dentist you are receiving this medicine. Avoid taking products that contain aspirin, acetaminophen, ibuprofen, naproxen, or ketoprofen unless instructed by your doctor. These medicines may hide a fever. Women should inform their doctor if they wish to become pregnant or think they might be pregnant. There is a potential for serious side effects to an unborn child. Talk to your health care professional or pharmacist for more information. Do not breast-feed an infant while taking this medicine. What side effects may I notice from receiving this medicine? Side effects that you should report to your doctor or health care professional as soon as possible: -allergic reactions like skin rash, itching or hives, swelling of the face, lips, or tongue -low blood counts - this medicine may decrease the number of white blood cells, red blood cells and platelets. You may be at increased risk for infections and bleeding. -signs of infection - fever or chills, cough, sore throat, pain or difficulty passing urine -signs of decreased platelets or bleeding - bruising, pinpoint red spots on the skin, black, tarry stools, blood in the urine -signs of decreased red blood cells - unusually weak or tired, fainting spells, lightheadedness -breathing problems -chest pain -mouth sores -nausea and vomiting -pain, swelling, redness at site where injected -pain, tingling, numbness  in the hands or feet -stomach pain -swelling of ankles, feet, hands -unusual bleeding Side effects that usually do not require medical attention (report to your doctor or health care professional if they continue or are bothersome): -constipation -diarrhea -hair loss -loss of appetite -stomach upset This list may not describe all possible side effects. Call your doctor for medical advice about side effects. You may report side effects to FDA at 1-800-FDA-1088. Where should I keep my medicine? This drug is given  in a hospital or clinic and will not be stored at home. NOTE: This sheet is a summary. It may not cover all possible information. If you have questions about this medicine, talk to your doctor, pharmacist, or health care provider.  2018 [employer] Standard (2007-10-11 18:45:54)

## 2017-11-01 NOTE — Progress Notes (Addendum)
[place]  [phone] [phone]     ID: Tracy Howell DOB: 1946/04/15  MR#: [id number]  VQQ#:[id number]  Patient Care Team: Lucianne Lei, MD as PCP - General (Family Medicine) Alphonsa Overall, MD as Consulting Physician (General Surgery) Magrinat, Virgie Dad, MD as Consulting Physician (Oncology) Eppie Gibson, MD as Attending Physician (Radiation Oncology) Juanita Craver, MD as Consulting Physician (Gastroenterology) OTHER MD:  CHIEF COMPLAINT: triple positive bilateral breast cancer  CURRENT TREATMENT: T-DM1   HISTORY OF CURRENT ILLNESS: From the original intake note:  "Tracy Howell" tells me she underwent left lumpectomy in [place] on 2012 for a 2.5 cm, grade 2 breast cancer involving one lymph node of 5 sampled. [ It may have been only 2 lymph nodes that were removed she says.]  This was at the [facility], currently the [facility] [address].  The patient says she was told could have more treatment if she wanted it.  She did not see any sense in it.  She feels her left breast cancer was caused by the fact that she kept her iPhone in her bra right by the left breast.  Sometime in April 2018 she noted a new mass in the left breast. She did not immediately bring it to medical attention but as it continued to grow she mentioned her to her primary care physician and on 03/29/2017 Tracy Howell underwent bilateral diagnostic mammography with tomography and left breast ultrasonography at [facility].  The breast density was category B.  In the left breast central to the nipple there was a 6 cm irregular mass with indistinct margins and heterogeneous calcifications.  By ultrasound this measured 5 cm, with indistinct margins, at the 12:00 anterior area.  There was associated edema.  The left axilla was sonographically benign.  Biopsy of the left breast area in question March 30, 2017 showed ([id number])  invasive ductal carcinoma, grade 3, estrogen receptor 100% positive, progesterone receptor 60% positive, both with strong staining intensity, HER-2 amplified, with a signals ratio of 3.38, and the number per cell 7.95.  The  MIB-1 was 70%.  The patient's subsequent history is as detailed below.  INTERVAL HISTORY: Tracy Howell returns today for further evaluation and treatment of her triple positive breast cancer accompanied by her husband.  She is here today to start neoadjuvant treatment with Gemcitabine, Carboplatin, Trastuzumab.  She will receive the Gemcitabine and Carboplatin on day 1 and the Trastuzumab and Gemcitabine on day 8.    REVIEW OF SYSTEMS: Tracy Howell is doing well today.  She had previously been experiencing drainage from her nose that was bloody.  She notes that it is improved.  We had suggested she use saline nasal spray previously.  She reported peripheral neuropathy previously.  That is resolved today.  She denies fevers, chills, nausea, vomiting, constipation, diarrhea, chest pain, palpitations, leg swelling, shortness of breath or cough.  A detailed ROS was otherwise non contributory today.     PAST MEDICAL HISTORY: Past Medical History:  Diagnosis Date  . Angioedema   . Breast cancer ([place]) 2013  . Complication of anesthesia    slow to wake up   . Pneumonia    hx of x 2   . recurrent left breast ca dx'd 04/2017  . SBO (small bowel obstruction) ([place]) 08/13/2014    PAST SURGICAL HISTORY: Past Surgical History:  Procedure Laterality Date  . ABDOMINAL HYSTERECTOMY    . APPENDECTOMY    . BREAST LUMPECTOMY  2013  . BREAST SURGERY    . CESAREAN SECTION     x2  . CHOLECYSTECTOMY    . PORTACATH PLACEMENT Right 05/17/2017   Procedure: INSERTION PORT-A-CATH right subclavian vein;  Surgeon: Alphonsa Overall, MD;  Location: [facility];  Service: General;  Laterality: Right;    FAMILY HISTORY Family History  Problem Relation Age of Onset  . Angioedema Mother   . Breast cancer  Mother   . Breast cancer Maternal Aunt   The patient was adopted.  Her adoptive parents died in their 44s.  The patient's birth mother however died in her 11s from breast cancer.  The patient believes she has 2 brothers is not sure about sisters.  However she does have 2 maternal aunts who had breast cancer.  There is no history of ovarian cancer in the family to her knowledge.  The patient has no information regarding her father or his side of the family.   GYNECOLOGIC HISTORY:  No LMP recorded. Patient has had a hysterectomy. Menarche age 42, first live birth age 56, she is [place] P2.  She underwent hysterectomy at age 67.  She tells me they left a fourth of an ovary at that time.  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  She actually lives in [place], [place], and spends 2 weeks here every 2 months at her job.  When she is in town she actually lives with Dr. Criss Rosales. 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  She recently married [name] who is a Camera operator.  He has 2 children of his own, in [place] and [place].  The patient's own children are [name] who lives in [place] and works in Engineer, technical sales, and [employer] lives in [place] and owns a [place].  The patient has no grandchildren.  She is 1/7-day [facility].  (She tells me her religion has no bands on any medical treatments but she cannot have alcohol, cigarettes, or pork).   ADVANCED DIRECTIVES:    HEALTH MAINTENANCE: Social History   Tobacco Use  . Smoking status: Never Smoker  . Smokeless tobacco: Never Used  Substance Use Topics  . Alcohol use: No  . Drug use: No     Colonoscopy:  PAP:  Bone density:   Allergies  Allergen Reactions  . Onion Anaphylaxis, Nausea And Vomiting and Swelling    Throat swelling.   . Other Anaphylaxis and Nausea And Vomiting    Green Peppers-anaphylactic, nausea/vomiting/swelling  Mushrooms-nausea/vomiting.  Patient has hereditary angioedema (HAE)  . Aleve [Naproxen] Other (See Comments)    bleeding  . Codeine Nausea And Vomiting  . Morphine And Related Nausea And Vomiting  . Penicillins Rash    Vomiting Has patient had a PCN reaction causing immediate rash, facial/tongue/throat swelling, SOB or lightheadedness with hypotension: Yes Has patient had a PCN reaction causing severe rash involving mucus membranes or skin necrosis: Yes Has patient had a PCN reaction that required hospitalization:Was inpatient when reaction occurred. Has patient had a PCN reaction occurring within the last 10 years: No If all of the above answers are "NO", then may proceed with Cephalosporin use.     Current Outpatient Medications  Medication Sig Dispense Refill  . anastrozole (ARIMIDEX) 1 MG tablet Take 1 tablet (1 mg total) by mouth daily. [place]  tablet 4  . Ascorbic Acid (VITAMIN C PO) Take 1 tablet by mouth daily.    . B Complex-C (B-COMPLEX WITH VITAMIN C) tablet Take 1 tablet by mouth daily.    . COD LIVER OIL PO Take 1 capsule by mouth daily.    Marland Kitchen dexamethasone (DECADRON) 4 MG tablet Take 2 tablets (8 mg total) by mouth daily. Start the day after chemotherapy for 2 days. Take with food. 30 tablet 1  . lidocaine-prilocaine (EMLA) cream Apply 1 application topically as needed. 30 g 0  . lidocaine-prilocaine (EMLA) cream Apply to affected area once 30 g 3  . LORazepam (ATIVAN) 0.5 MG tablet Take 1 tablet (0.5 mg total) by mouth at bedtime as needed (Nausea or vomiting). 30 tablet 0  . Multiple Vitamin (MULTIVITAMIN WITH MINERALS) TABS tablet Take 1 tablet by mouth daily.    . prochlorperazine (COMPAZINE) 10 MG tablet Take 1 tablet (10 mg total) by mouth every 6 (six) hours as needed (Nausea or vomiting). 30 tablet 1  . traMADol (ULTRAM) 50 MG tablet Take 1 tablet (50 mg total) by  mouth every 6 (six) hours as needed. 30 tablet 0  . triamterene-hydrochlorothiazide (DYAZIDE) 37.5-25 MG capsule Take 1 each (1 capsule total) by mouth daily. 30 capsule 0   No current facility-administered medications for this visit.    Facility-Administered Medications Ordered in Other Visits  Medication Dose Route Frequency Provider Last Rate Last Dose  . CARBOplatin (PARAPLATIN) 210 mg in sodium chloride 0.9 % 250 mL chemo infusion  210 mg Intravenous Once Magrinat, Virgie Dad, MD      . gemcitabine (GEMZAR) 1,786 mg in sodium chloride 0.9 % 250 mL chemo infusion  800 mg/m2 (Treatment Plan Recorded) Intravenous Once Magrinat, Virgie Dad, MD 594 mL/hr at 11/01/17 1541 1,786 mg at 11/01/17 1541    OBJECTIVE:   Vitals:   11/01/17 1331  BP: (!) 159/77  Pulse: 78  Resp: 18  Temp: 98.5 F (36.9 C)  SpO2: 98%     Body mass index is 36.41 kg/m.   Wt Readings from Last 3 Encounters:  11/01/17 229 lb (103.9 kg)  10/20/17 232 lb 12.8 oz (105.6 kg)  10/12/17 234 lb (106.1 kg)  ECOG FS:1 - Symptomatic but completely ambulatory GENERAL: Patient is a well appearing female in no acute distress HEENT:  Sclerae anicteric.  Oropharynx clear and moist. No ulcerations or evidence of oropharyngeal candidiasis. Neck is supple. Nose has some erythema on it, patient notes pimple in nare (she says happens occasionally) NODES:  No cervical, supraclavicular, or axillary lymphadenopathy palpated.  BREAST EXAM:  See picture below LUNGS:  Clear to auscultation bilaterally.  No wheezes or rhonchi. HEART:  Regular rate and rhythm. No murmur appreciated. ABDOMEN:  Soft, nontender.  Positive, normoactive bowel sounds. No organomegaly palpated. MSK:  No focal spinal tenderness to palpation. Full range of motion bilaterally in the upper extremities. EXTREMITIES:  No peripheral edema.   SKIN:  Clear with no obvious rashes or skin changes. No nail dyscrasia. NEURO:  Nonfocal. Well oriented.  Appropriate  affect.  Breast on 11/01/2017     LAB RESULTS:  CMP     Component Value Date/Time   NA 138 11/01/2017 1235   NA 142 05/12/2017 0808   K 3.6 11/01/2017 1235   K 3.5 05/12/2017 0808   CL 103 11/01/2017 1235   CO2 29 11/01/2017 1235   CO2 26 05/12/2017 0808   GLUCOSE 72 11/01/2017 1235   GLUCOSE 133 05/12/2017 5732  BUN 6 (L) 11/01/2017 1235   BUN 9.8 05/12/2017 0808   CREATININE 0.73 11/01/2017 1235   CREATININE 0.9 05/12/2017 0808   CALCIUM 9.3 11/01/2017 1235   CALCIUM 9.3 05/12/2017 0808   PROT 8.2 11/01/2017 1235   PROT 7.3 05/12/2017 0808   ALBUMIN 3.2 (L) 11/01/2017 1235   ALBUMIN 3.3 (L) 05/12/2017 0808   AST 93 (H) 11/01/2017 1235   AST 15 05/12/2017 0808   ALT 32 11/01/2017 1235   ALT 10 05/12/2017 0808   ALKPHOS 121 11/01/2017 1235   ALKPHOS 90 05/12/2017 0808   BILITOT 0.7 11/01/2017 1235   BILITOT 0.56 05/12/2017 0808   GFRNONAA >60 11/01/2017 1235   GFRAA >60 11/01/2017 1235    No results found for: Ronnald Ramp, A1GS, A2GS, BETS, BETA2SER, GAMS, MSPIKE, SPEI  No results found for: Nils Pyle, [facility]  Lab Results  Component Value Date   WBC 7.8 11/01/2017   NEUTROABS 3.8 11/01/2017   HGB 11.6 11/01/2017   HCT 35.1 11/01/2017   MCV 86.2 11/01/2017   PLT 240 11/01/2017      Chemistry      Component Value Date/Time   NA 138 11/01/2017 1235   NA 142 05/12/2017 0808   K 3.6 11/01/2017 1235   K 3.5 05/12/2017 0808   CL 103 11/01/2017 1235   CO2 29 11/01/2017 1235   CO2 26 05/12/2017 0808   BUN 6 (L) 11/01/2017 1235   BUN 9.8 05/12/2017 0808   CREATININE 0.73 11/01/2017 1235   CREATININE 0.9 05/12/2017 0808      Component Value Date/Time   CALCIUM 9.3 11/01/2017 1235   CALCIUM 9.3 05/12/2017 0808   ALKPHOS 121 11/01/2017 1235   ALKPHOS 90 05/12/2017 0808   AST 93 (H) 11/01/2017 1235   AST 15 05/12/2017 0808   ALT 32 11/01/2017 1235   ALT 10 05/12/2017 0808   BILITOT 0.7 11/01/2017 1235   BILITOT 0.56  05/12/2017 0808       No results found for: LABCA2  No components found for: [id number]  No results for input(s): INR in the last 168 hours.  No results found for: LABCA2  No results found for: CAN199  No results found for: LDJ570  No results found for: VXB939  Lab Results  Component Value Date   CA2729 22.0 08/31/2017    No components found for: HGQUANT  No results found for: CEA1 / No results found for: CEA1   No results found for: AFPTUMOR  No results found for: [place]  No results found for: PSA1  Appointment on 11/01/2017  Component Date Value Ref Range Status  . Sodium 11/01/2017 138  136 - 145 mmol/L Final  . Potassium 11/01/2017 3.6  3.5 - 5.1 mmol/L Final  . Chloride 11/01/2017 103  98 - 109 mmol/L Final  . CO2 11/01/2017 29  22 - 29 mmol/L Final  . Glucose, Bld 11/01/2017 72  70 - 140 mg/dL Final  . BUN 11/01/2017 6* 7 - 26 mg/dL Final  . Creatinine 11/01/2017 0.73  0.60 - 1.10 mg/dL Final  . Calcium 11/01/2017 9.3  8.4 - 10.4 mg/dL Final  . Total Protein 11/01/2017 8.2  6.4 - 8.3 g/dL Final  . Albumin 11/01/2017 3.2* 3.5 - 5.0 g/dL Final  . AST 11/01/2017 93* 5 - 34 U/L Final  . ALT 11/01/2017 32  0 - 55 U/L Final  . Alkaline Phosphatase 11/01/2017 121  40 - 150 U/L Final  . Total Bilirubin 11/01/2017 0.7  0.2 -  1.2 mg/dL Final  . GFR, Est Non Af Am 11/01/2017 >60  >60 mL/min Final  . GFR, Est AFR Am 11/01/2017 >60  >60 mL/min Final   Comment: (NOTE) The eGFR has been calculated using the CKD EPI equation. This calculation has not been validated in all clinical situations. eGFR's persistently <60 mL/min signify possible Chronic Kidney Disease.   Georgiann Hahn gap 11/01/2017 6  3 - 11 Final   Performed at [facility], [place] [address], [place], [place] [address]  . WBC Count 11/01/2017 7.8  3.9 - 10.3 K/uL Final  . RBC 11/01/2017 4.08  3.70 - 5.45 MIL/uL Final  . Hemoglobin 11/01/2017 11.6  11.6 - 15.9 g/dL Final  . HCT  11/01/2017 35.1  34.8 - 46.6 % Final  . MCV 11/01/2017 86.2  79.5 - 101.0 fL Final  . MCH 11/01/2017 28.5  25.1 - 34.0 pg Final  . MCHC 11/01/2017 33.0  31.5 - 36.0 g/dL Final  . RDW 11/01/2017 16.6* 11.2 - 14.5 % Final  . Platelet Count 11/01/2017 240  145 - 400 K/uL Final  . Neutrophils Relative % 11/01/2017 49  % Final  . Neutro Abs 11/01/2017 3.8  1.5 - 6.5 K/uL Final  . Lymphocytes Relative 11/01/2017 34  % Final  . Lymphs Abs 11/01/2017 2.6  0.9 - 3.3 K/uL Final  . Monocytes Relative 11/01/2017 14  % Final  . Monocytes Absolute 11/01/2017 1.1* 0.1 - 0.9 K/uL Final  . Eosinophils Relative 11/01/2017 2  % Final  . Eosinophils Absolute 11/01/2017 0.2  0.0 - 0.5 K/uL Final  . Basophils Relative 11/01/2017 1  % Final  . Basophils Absolute 11/01/2017 0.1  0.0 - 0.1 K/uL Final   Performed at [facility], [address] [place], [place] [address]    (this displays the last labs from the last 3 days)  No results found for: TOTALPROTELP, ALBUMINELP, A1GS, A2GS, BETS, BETA2SER, GAMS, MSPIKE, SPEI (this displays SPEP labs)  No results found for: KPAFRELGTCHN, LAMBDASER, KAPLAMBRATIO (kappa/lambda light chains)  No results found for: HGBA, HGBA2QUANT, HGBFQUANT, HGBSQUAN (Hemoglobinopathy evaluation)   No results found for: LDH  No results found for: IRON, TIBC, IRONPCTSAT (Iron and TIBC)  No results found for: FERRITIN  Urinalysis    Component Value Date/Time   COLORURINE YELLOW 08/10/2017 0924   APPEARANCEUR CLEAR 08/10/2017 0924   LABSPEC 1.010 08/10/2017 0924   PHURINE 6.5 08/10/2017 [place] 08/10/2017 0924   HGBUR NEGATIVE 08/10/2017 0924   BILIRUBINUR NEGATIVE 08/10/2017 0924   KETONESUR NEGATIVE 08/10/2017 0924   PROTEINUR NEGATIVE 08/10/2017 0924   NITRITE NEGATIVE 08/10/2017 0924   LEUKOCYTESUR SMALL (A) 08/10/2017 0924     STUDIES: Repeat echocardiogram scheduled for 09/23/2017  ELIGIBLE FOR AVAILABLE RESEARCH  PROTOCOL:no  ASSESSMENT: 72 y.o. [place] woman (primarily residing in [place])  (1) status post left lumpectomy in 2012 for a reported pT2 pN1 breast cancer, the patient refusing adjuvant treatment (no chemotherapy, radiation, or antiestrogens).  METASTATIC DISEASE: December 2018 (bilateral triple positive disease) (2) status post left breast biopsy 03/30/2017 for a clinical T4 N0, stage IIIB invasive ductal carcinoma, grade 3, triple positive, with an MIB-1 of 70%  (a) staging studies 05/21/2017 show multiple bilateral pulmonary nodules consistent with stage IV disease 05/21/2017; there are no liver or bone lesions noted  (b) breast MRI 05/13/2017 shows a 7 cm area of non-masslike enhancement in the right breast, with biopsy ([id number]) on 06/24/2017 showing: Metastatic carcinoma. Prognostic  panel was strongly ER and PR positive as well as HER-2 positive with the signals ratio of 2.61 and number per cell 5.35 (triple positive)  (3) neoadjuvant chemotherapy with T-DM1 started 06/29/2016  (a) echocardiogram 06/24/2017 showed an ejection fraction of 60-65 %  (B) echocardiogram 09/23/2017 showed an ejection fraction of 65-70%  (c) CT chest on 10/12/2017 shows progression in nodes, pulmonary nodules, and bone mets  (4) definitive surgery to follow once left chest wall disease under better control  (5) anastrozole written on 09/21/2017, never started by patient  (a) patient states was unable to tolerate tamoxifen in the past (nausea).  (6) genetics testing pending  (7) chemotherapy with carboplatin, gemcitabine, and trastuzumab to start 11/01/2017, with Gemcitabine and Carboplatin given on day 1, and Gemcitabine and Trastuzumab given on day 8 to be repeated every 21 days x 6, or to optimal response  PLAN: Tracy Howell is doing well today.  Her labs are stable.  She will proceed with treatment today with Gemcitabine and Carboplatin.   She met with Dr. Jana Hakim today who reviewed her chemotherapy plan  and the fact that she will need treatment on day 1 and 8. The patient and her husband are in agreement.  I adjusted her schedule accordingly.  I gave her information on how to take her anti nausea medications.  She let me know that she has these.  She will pick up her dyazide from her pharmacy today.    Tracy Howell will return in one week for labs, f/u, and day 8 of her chemotherapy.  Tracy Howell knows to call for any questions or concerns prior to her next appointment with [place].     Virgie Dad Magrinat MD   11/01/17 4:08 PM Medical Oncology and Hematology [facility] [address] [place], [place] [address] Tel. [phone]    Fax. [phone]    ADDENDUM: Tracy Howell is starting her chemotherapy today.  We again discussed the possible toxicity side effects and complications.  She also understands the possible benefits and she knows that I do expect her left breast to improve although it is not clear whether it will improve enough for her safely to undergo a mastectomy that however is the initial goal.  She understands she will receive 3 cycles which is 6 doses and then we will reassess.  Ideally she would be treated days 1 and 8 of each 21-day cycle.  This presents problems because of course she still lives in [place].  I think the way she is thinking about it she will stay here from days 1 through 8 and then go back to [place] until the next cycle develops.  We are going to see her again with day 8 as so we can troubleshoot any symptoms that may develop.  Even though she tells me she is "not going to have any side effects", I asked her to please call us even if she just thought she might have any.  I personally saw this patient and performed a substantive portion of this encounter with the listed APP documented above.   Chauncey Cruel, MD Medical Oncology and Hematology [facility] [address] [place], [place] [address] Tel. [phone]    Fax.  [phone]

## 2017-11-01 NOTE — Progress Notes (Signed)
MD would like to decrease Gemzar dose to 800mg /m2 for today and future doses. Orders changed as requested.  B.Truc Winfree, PharmD, BCPS, BCOP

## 2017-11-02 ENCOUNTER — Ambulatory Visit: Payer: Medicare Other

## 2017-11-02 ENCOUNTER — Ambulatory Visit: Payer: Medicare Other | Admitting: Adult Health

## 2017-11-02 ENCOUNTER — Other Ambulatory Visit: Payer: Self-pay | Admitting: Adult Health

## 2017-11-02 ENCOUNTER — Other Ambulatory Visit: Payer: Medicare Other

## 2017-11-02 LAB — CANCER ANTIGEN 27.29: CA 27.29: 26.4 U/mL (ref 0.0–38.6)

## 2017-11-09 ENCOUNTER — Inpatient Hospital Stay: Payer: Medicare Other

## 2017-11-09 VITALS — BP 145/64 | HR 87 | Temp 98.4°F | Resp 16

## 2017-11-09 DIAGNOSIS — Z5111 Encounter for antineoplastic chemotherapy: Secondary | ICD-10-CM | POA: Diagnosis not present

## 2017-11-09 DIAGNOSIS — C7801 Secondary malignant neoplasm of right lung: Secondary | ICD-10-CM | POA: Diagnosis not present

## 2017-11-09 DIAGNOSIS — C50212 Malignant neoplasm of upper-inner quadrant of left female breast: Secondary | ICD-10-CM

## 2017-11-09 DIAGNOSIS — C7951 Secondary malignant neoplasm of bone: Secondary | ICD-10-CM | POA: Diagnosis not present

## 2017-11-09 DIAGNOSIS — Z170 Estrogen receptor positive status [ER+]: Principal | ICD-10-CM

## 2017-11-09 DIAGNOSIS — C7802 Secondary malignant neoplasm of left lung: Secondary | ICD-10-CM | POA: Diagnosis not present

## 2017-11-09 DIAGNOSIS — Z5112 Encounter for antineoplastic immunotherapy: Secondary | ICD-10-CM | POA: Diagnosis not present

## 2017-11-09 LAB — CBC WITH DIFFERENTIAL (CANCER CENTER ONLY)
Basophils Absolute: 0 10*3/uL (ref 0.0–0.1)
Basophils Relative: 1 %
Eosinophils Absolute: 0.2 10*3/uL (ref 0.0–0.5)
Eosinophils Relative: 3 %
HEMATOCRIT: 33.6 % — AB (ref 34.8–46.6)
Hemoglobin: 10.8 g/dL — ABNORMAL LOW (ref 11.6–15.9)
LYMPHS ABS: 2.2 10*3/uL (ref 0.9–3.3)
Lymphocytes Relative: 45 %
MCH: 28.5 pg (ref 25.1–34.0)
MCHC: 32.1 g/dL (ref 31.5–36.0)
MCV: 88.7 fL (ref 79.5–101.0)
MONO ABS: 0.2 10*3/uL (ref 0.1–0.9)
MONOS PCT: 4 %
NEUTROS ABS: 2.3 10*3/uL (ref 1.5–6.5)
NEUTROS PCT: 47 %
Platelet Count: 144 10*3/uL — ABNORMAL LOW (ref 145–400)
RBC: 3.79 MIL/uL (ref 3.70–5.45)
RDW: 15.6 % — AB (ref 11.2–14.5)
WBC Count: 4.9 10*3/uL (ref 3.9–10.3)

## 2017-11-09 LAB — COMPREHENSIVE METABOLIC PANEL
ALK PHOS: 109 U/L (ref 38–126)
ALT: 22 U/L (ref 14–54)
ANION GAP: 10 (ref 5–15)
AST: 60 U/L — ABNORMAL HIGH (ref 15–41)
Albumin: 3.1 g/dL — ABNORMAL LOW (ref 3.5–5.0)
BILIRUBIN TOTAL: 0.4 mg/dL (ref 0.3–1.2)
BUN: 7 mg/dL (ref 6–20)
CALCIUM: 9.1 mg/dL (ref 8.9–10.3)
CO2: 25 mmol/L (ref 22–32)
Chloride: 104 mmol/L (ref 101–111)
Creatinine, Ser: 0.6 mg/dL (ref 0.44–1.00)
GLUCOSE: 106 mg/dL — AB (ref 65–99)
Potassium: 3.5 mmol/L (ref 3.5–5.1)
Sodium: 139 mmol/L (ref 135–145)
TOTAL PROTEIN: 7.9 g/dL (ref 6.5–8.1)

## 2017-11-09 MED ORDER — DIPHENHYDRAMINE HCL 25 MG PO CAPS
25.0000 mg | ORAL_CAPSULE | Freq: Once | ORAL | Status: AC
  Administered 2017-11-09: 25 mg via ORAL

## 2017-11-09 MED ORDER — DIPHENHYDRAMINE HCL 25 MG PO CAPS
ORAL_CAPSULE | ORAL | Status: AC
  Filled 2017-11-09: qty 1

## 2017-11-09 MED ORDER — DEXAMETHASONE SODIUM PHOSPHATE 10 MG/ML IJ SOLN
INTRAMUSCULAR | Status: AC
  Filled 2017-11-09: qty 1

## 2017-11-09 MED ORDER — SODIUM CHLORIDE 0.9 % IV SOLN
Freq: Once | INTRAVENOUS | Status: AC
  Administered 2017-11-09: 08:00:00 via INTRAVENOUS

## 2017-11-09 MED ORDER — SODIUM CHLORIDE 0.9% FLUSH
10.0000 mL | INTRAVENOUS | Status: DC | PRN
  Administered 2017-11-09: 10 mL
  Filled 2017-11-09: qty 10

## 2017-11-09 MED ORDER — TRASTUZUMAB CHEMO 150 MG IV SOLR
8.0000 mg/kg | Freq: Once | INTRAVENOUS | Status: AC
  Administered 2017-11-09: 840 mg via INTRAVENOUS
  Filled 2017-11-09: qty 40

## 2017-11-09 MED ORDER — PALONOSETRON HCL INJECTION 0.25 MG/5ML
0.2500 mg | Freq: Once | INTRAVENOUS | Status: AC
  Administered 2017-11-09: 0.25 mg via INTRAVENOUS

## 2017-11-09 MED ORDER — ACETAMINOPHEN 325 MG PO TABS
650.0000 mg | ORAL_TABLET | Freq: Once | ORAL | Status: AC
  Administered 2017-11-09: 650 mg via ORAL

## 2017-11-09 MED ORDER — SODIUM CHLORIDE 0.9 % IV SOLN
214.6000 mg | Freq: Once | INTRAVENOUS | Status: AC
  Administered 2017-11-09: 210 mg via INTRAVENOUS
  Filled 2017-11-09: qty 21

## 2017-11-09 MED ORDER — HEPARIN SOD (PORK) LOCK FLUSH 100 UNIT/ML IV SOLN
500.0000 [IU] | Freq: Once | INTRAVENOUS | Status: AC | PRN
  Administered 2017-11-09: 500 [IU]
  Filled 2017-11-09: qty 5

## 2017-11-09 MED ORDER — SODIUM CHLORIDE 0.9 % IV SOLN
800.0000 mg/m2 | Freq: Once | INTRAVENOUS | Status: AC
  Administered 2017-11-09: 1786 mg via INTRAVENOUS
  Filled 2017-11-09: qty 46.97

## 2017-11-09 MED ORDER — PALONOSETRON HCL INJECTION 0.25 MG/5ML
INTRAVENOUS | Status: AC
  Filled 2017-11-09: qty 5

## 2017-11-09 MED ORDER — DEXAMETHASONE SODIUM PHOSPHATE 10 MG/ML IJ SOLN
10.0000 mg | Freq: Once | INTRAMUSCULAR | Status: AC
  Administered 2017-11-09: 10 mg via INTRAVENOUS

## 2017-11-09 MED ORDER — ACETAMINOPHEN 325 MG PO TABS
ORAL_TABLET | ORAL | Status: AC
  Filled 2017-11-09: qty 2

## 2017-11-09 NOTE — Progress Notes (Signed)
Okay to release Herceptin treatment prior to labs for 11/09/2017 per Wilber Bihari, NP.

## 2017-11-09 NOTE — Patient Instructions (Addendum)
[facility] Discharge Instructions for Patients Receiving Chemotherapy  Today you received the following chemotherapy agents: Herceptin, Gemzar, and Carboplatin.   To help prevent nausea and vomiting after your treatment, we encourage you to take your nausea medication as directed.   If you develop nausea and vomiting that is not controlled by your nausea medication, call the clinic.   BELOW ARE SYMPTOMS THAT SHOULD BE REPORTED IMMEDIATELY:  *FEVER GREATER THAN 100.5 F  *CHILLS WITH OR WITHOUT FEVER  NAUSEA AND VOMITING THAT IS NOT CONTROLLED WITH YOUR NAUSEA MEDICATION  *UNUSUAL SHORTNESS OF BREATH  *UNUSUAL BRUISING OR BLEEDING  TENDERNESS IN MOUTH AND THROAT WITH OR WITHOUT PRESENCE OF ULCERS  *URINARY PROBLEMS  *BOWEL PROBLEMS  UNUSUAL RASH Items with * indicate a potential emergency and should be followed up as soon as possible.  Feel free to call the clinic should you have any questions or concerns. The clinic phone number is [phone].  Please show the [facility] at check-in to the Emergency Department and triage nurse.  Trastuzumab injection for infusion What is this medicine? TRASTUZUMAB (tras TOO zoo mab) is a monoclonal antibody. It is used to treat breast cancer and stomach cancer. This medicine may be used for other purposes; ask your health care provider or pharmacist if you have questions. COMMON BRAND NAME(S): Herceptin What should I tell my health care provider before I take this medicine? They need to know if you have any of these conditions: -heart disease -heart failure -lung or breathing disease, like asthma -an unusual or allergic reaction to trastuzumab, benzyl alcohol, or other medications, foods, dyes, or preservatives -pregnant or trying to get pregnant -breast-feeding How should I use this medicine? This drug is given as an infusion into a vein. It is administered in a hospital or clinic by a specially trained health  care professional. Talk to your pediatrician regarding the use of this medicine in children. This medicine is not approved for use in children. Overdosage: If you think you have taken too much of this medicine contact a poison control center or emergency room at once. NOTE: This medicine is only for you. Do not share this medicine with others. What if I miss a dose? It is important not to miss a dose. Call your doctor or health care professional if you are unable to keep an appointment. What may interact with this medicine? This medicine may interact with the following medications: -certain types of chemotherapy, such as daunorubicin, doxorubicin, epirubicin, and idarubicin This list may not describe all possible interactions. Give your health care provider a list of all the medicines, herbs, non-prescription drugs, or dietary supplements you use. Also tell them if you smoke, drink alcohol, or use illegal drugs. Some items may interact with your medicine. What should I watch for while using this medicine? Visit your doctor for checks on your progress. Report any side effects. Continue your course of treatment even though you feel ill unless your doctor tells you to stop. Call your doctor or health care professional for advice if you get a fever, chills or sore throat, or other symptoms of a cold or flu. Do not treat yourself. Try to avoid being around people who are sick. You may experience fever, chills and shaking during your first infusion. These effects are usually mild and can be treated with other medicines. Report any side effects during the infusion to your health care professional. Fever and chills usually do not happen with later infusions. Do  not become pregnant while taking this medicine or for 7 months after stopping it. Women should inform their doctor if they wish to become pregnant or think they might be pregnant. Women of child-bearing potential will need to have a negative pregnancy  test before starting this medicine. There is a potential for serious side effects to an unborn child. Talk to your health care professional or pharmacist for more information. Do not breast-feed an infant while taking this medicine or for 7 months after stopping it. Women must use effective birth control with this medicine. What side effects may I notice from receiving this medicine? Side effects that you should report to your doctor or health care professional as soon as possible: -allergic reactions like skin rash, itching or hives, swelling of the face, lips, or tongue -chest pain or palpitations -cough -dizziness -feeling faint or lightheaded, falls -fever -general ill feeling or flu-like symptoms -signs of worsening heart failure like breathing problems; swelling in your legs and feet -unusually weak or tired Side effects that usually do not require medical attention (report to your doctor or health care professional if they continue or are bothersome): -bone pain -changes in taste -diarrhea -joint pain -nausea/vomiting -weight loss This list may not describe all possible side effects. Call your doctor for medical advice about side effects. You may report side effects to FDA at 1-800-FDA-1088. Where should I keep my medicine? This drug is given in a hospital or clinic and will not be stored at home. NOTE: This sheet is a summary. It may not cover all possible information. If you have questions about this medicine, talk to your doctor, pharmacist, or health care provider.  2018 Elsevier/Gold Standard (2016-05-26 14:37:52)

## 2017-11-17 ENCOUNTER — Ambulatory Visit: Payer: Medicare Other | Admitting: Adult Health

## 2017-11-17 ENCOUNTER — Ambulatory Visit: Payer: Medicare Other

## 2017-11-17 ENCOUNTER — Other Ambulatory Visit: Payer: Medicare Other

## 2017-11-23 ENCOUNTER — Inpatient Hospital Stay: Payer: Medicare Other

## 2017-11-23 ENCOUNTER — Telehealth: Payer: Self-pay | Admitting: Adult Health

## 2017-11-23 ENCOUNTER — Inpatient Hospital Stay (HOSPITAL_BASED_OUTPATIENT_CLINIC_OR_DEPARTMENT_OTHER): Payer: Medicare Other | Admitting: Adult Health

## 2017-11-23 ENCOUNTER — Encounter: Payer: Self-pay | Admitting: Adult Health

## 2017-11-23 ENCOUNTER — Other Ambulatory Visit: Payer: Self-pay | Admitting: Oncology

## 2017-11-23 ENCOUNTER — Inpatient Hospital Stay: Payer: Medicare Other | Attending: Oncology

## 2017-11-23 ENCOUNTER — Ambulatory Visit: Payer: Medicare Other

## 2017-11-23 VITALS — BP 165/61 | HR 91 | Temp 98.4°F | Resp 18 | Ht 66.5 in | Wt 224.7 lb

## 2017-11-23 DIAGNOSIS — Z5112 Encounter for antineoplastic immunotherapy: Secondary | ICD-10-CM | POA: Insufficient documentation

## 2017-11-23 DIAGNOSIS — Z853 Personal history of malignant neoplasm of breast: Secondary | ICD-10-CM | POA: Insufficient documentation

## 2017-11-23 DIAGNOSIS — Z170 Estrogen receptor positive status [ER+]: Secondary | ICD-10-CM

## 2017-11-23 DIAGNOSIS — D6481 Anemia due to antineoplastic chemotherapy: Secondary | ICD-10-CM

## 2017-11-23 DIAGNOSIS — Z5189 Encounter for other specified aftercare: Secondary | ICD-10-CM | POA: Insufficient documentation

## 2017-11-23 DIAGNOSIS — Z79899 Other long term (current) drug therapy: Secondary | ICD-10-CM | POA: Diagnosis not present

## 2017-11-23 DIAGNOSIS — C7951 Secondary malignant neoplasm of bone: Secondary | ICD-10-CM | POA: Diagnosis not present

## 2017-11-23 DIAGNOSIS — Z5111 Encounter for antineoplastic chemotherapy: Secondary | ICD-10-CM | POA: Insufficient documentation

## 2017-11-23 DIAGNOSIS — G629 Polyneuropathy, unspecified: Secondary | ICD-10-CM | POA: Diagnosis not present

## 2017-11-23 DIAGNOSIS — C50212 Malignant neoplasm of upper-inner quadrant of left female breast: Secondary | ICD-10-CM

## 2017-11-23 DIAGNOSIS — D701 Agranulocytosis secondary to cancer chemotherapy: Secondary | ICD-10-CM | POA: Diagnosis not present

## 2017-11-23 DIAGNOSIS — Z803 Family history of malignant neoplasm of breast: Secondary | ICD-10-CM | POA: Insufficient documentation

## 2017-11-23 DIAGNOSIS — C7801 Secondary malignant neoplasm of right lung: Secondary | ICD-10-CM | POA: Insufficient documentation

## 2017-11-23 DIAGNOSIS — Z95828 Presence of other vascular implants and grafts: Secondary | ICD-10-CM

## 2017-11-23 DIAGNOSIS — C7802 Secondary malignant neoplasm of left lung: Secondary | ICD-10-CM | POA: Diagnosis not present

## 2017-11-23 DIAGNOSIS — C7800 Secondary malignant neoplasm of unspecified lung: Secondary | ICD-10-CM

## 2017-11-23 LAB — CBC WITH DIFFERENTIAL (CANCER CENTER ONLY)
BASOS ABS: 0.1 10*3/uL (ref 0.0–0.1)
Basophils Relative: 1 %
Eosinophils Absolute: 0.1 10*3/uL (ref 0.0–0.5)
Eosinophils Relative: 3 %
HCT: 31.2 % — ABNORMAL LOW (ref 34.8–46.6)
Hemoglobin: 10.4 g/dL — ABNORMAL LOW (ref 11.6–15.9)
LYMPHS ABS: 1.4 10*3/uL (ref 0.9–3.3)
LYMPHS PCT: 29 %
MCH: 29.6 pg (ref 25.1–34.0)
MCHC: 33.4 g/dL (ref 31.5–36.0)
MCV: 88.4 fL (ref 79.5–101.0)
Monocytes Absolute: 0.6 10*3/uL (ref 0.1–0.9)
Monocytes Relative: 13 %
NEUTROS ABS: 2.6 10*3/uL (ref 1.5–6.5)
NEUTROS PCT: 54 %
PLATELETS: 373 10*3/uL (ref 145–400)
RBC: 3.53 MIL/uL — AB (ref 3.70–5.45)
RDW: 17.8 % — ABNORMAL HIGH (ref 11.2–14.5)
WBC: 4.8 10*3/uL (ref 3.9–10.3)

## 2017-11-23 LAB — CMP (CANCER CENTER ONLY)
ALT: 13 U/L (ref 0–55)
AST: 32 U/L (ref 5–34)
Albumin: 3 g/dL — ABNORMAL LOW (ref 3.5–5.0)
Alkaline Phosphatase: 107 U/L (ref 40–150)
Anion gap: 9 (ref 3–11)
BUN: 6 mg/dL — AB (ref 7–26)
CALCIUM: 9.2 mg/dL (ref 8.4–10.4)
CHLORIDE: 107 mmol/L (ref 98–109)
CO2: 26 mmol/L (ref 22–29)
Creatinine: 0.76 mg/dL (ref 0.60–1.10)
Glucose, Bld: 143 mg/dL — ABNORMAL HIGH (ref 70–140)
Potassium: 3.4 mmol/L — ABNORMAL LOW (ref 3.5–5.1)
Sodium: 142 mmol/L (ref 136–145)
Total Bilirubin: 0.6 mg/dL (ref 0.2–1.2)
Total Protein: 7.6 g/dL (ref 6.4–8.3)

## 2017-11-23 MED ORDER — DIPHENHYDRAMINE HCL 25 MG PO CAPS: 25.0000 mg | ORAL_CAPSULE | Freq: Once | ORAL | Status: DC

## 2017-11-23 MED ORDER — SODIUM CHLORIDE 0.9% FLUSH
10.0000 mL | Freq: Once | INTRAVENOUS | Status: AC
  Administered 2017-11-23: 10 mL
  Filled 2017-11-23: qty 10

## 2017-11-23 MED ORDER — SODIUM CHLORIDE 0.9 % IV SOLN
Freq: Once | INTRAVENOUS | Status: AC
  Administered 2017-11-23: 11:00:00 via INTRAVENOUS

## 2017-11-23 MED ORDER — ACETAMINOPHEN 325 MG PO TABS
ORAL_TABLET | ORAL | Status: AC
Start: 2017-11-23 — End: ?
  Filled 2017-11-23: qty 2

## 2017-11-23 MED ORDER — PALONOSETRON HCL INJECTION 0.25 MG/5ML
INTRAVENOUS | Status: AC
  Filled 2017-11-23: qty 5

## 2017-11-23 MED ORDER — SODIUM CHLORIDE 0.9 % IV SOLN
800.0000 mg/m2 | Freq: Once | INTRAVENOUS | Status: AC
  Administered 2017-11-23: 1786 mg via INTRAVENOUS
  Filled 2017-11-23: qty 46.97

## 2017-11-23 MED ORDER — SODIUM CHLORIDE 0.9 % IV SOLN
214.6000 mg | Freq: Once | INTRAVENOUS | Status: AC
  Administered 2017-11-23: 210 mg via INTRAVENOUS
  Filled 2017-11-23: qty 21

## 2017-11-23 MED ORDER — DIPHENHYDRAMINE HCL 25 MG PO CAPS
ORAL_CAPSULE | ORAL | Status: AC
  Filled 2017-11-23: qty 1

## 2017-11-23 MED ORDER — TRASTUZUMAB CHEMO 150 MG IV SOLR: 6.0000 mg/kg | Freq: Once | INTRAVENOUS | Status: DC

## 2017-11-23 MED ORDER — DEXAMETHASONE SODIUM PHOSPHATE 10 MG/ML IJ SOLN
INTRAMUSCULAR | Status: AC
  Filled 2017-11-23: qty 1

## 2017-11-23 MED ORDER — DEXAMETHASONE SODIUM PHOSPHATE 10 MG/ML IJ SOLN
10.0000 mg | Freq: Once | INTRAMUSCULAR | Status: AC
  Administered 2017-11-23: 10 mg via INTRAVENOUS

## 2017-11-23 MED ORDER — PALONOSETRON HCL INJECTION 0.25 MG/5ML
0.2500 mg | Freq: Once | INTRAVENOUS | Status: AC
  Administered 2017-11-23: 0.25 mg via INTRAVENOUS

## 2017-11-23 MED ORDER — HEPARIN SOD (PORK) LOCK FLUSH 100 UNIT/ML IV SOLN
500.0000 [IU] | Freq: Once | INTRAVENOUS | Status: AC | PRN
  Administered 2017-11-23: 500 [IU]
  Filled 2017-11-23: qty 5

## 2017-11-23 MED ORDER — SODIUM CHLORIDE 0.9% FLUSH
10.0000 mL | INTRAVENOUS | Status: DC | PRN
  Administered 2017-11-23: 10 mL
  Filled 2017-11-23: qty 10

## 2017-11-23 MED ORDER — ACETAMINOPHEN 325 MG PO TABS: 650.0000 mg | ORAL_TABLET | Freq: Once | ORAL | Status: DC

## 2017-11-23 NOTE — Progress Notes (Signed)
[facility]  Telephone:([phone] Fax:(336) [phone]     ID: Tracy Howell DOB: [birth year]  MR#: [id number]  AYT#:[id number]  Patient Care Team: Lucianne Lei, MD as PCP - General (Family Medicine) Alphonsa Overall, MD as Consulting Physician (General Surgery) Magrinat, Virgie Dad, MD as Consulting Physician (Oncology) Eppie Gibson, MD as Attending Physician (Radiation Oncology) Juanita Craver, MD as Consulting Physician (Gastroenterology) OTHER MD:  CHIEF COMPLAINT: triple positive bilateral breast cancer  CURRENT TREATMENT: Gemcitabine, Carboplatin, Trastuzumab, Pertuzumab   HISTORY OF CURRENT ILLNESS: From the original intake note:  "Tracy Howell" tells me she underwent left lumpectomy in [place] on 2012 for a 2.5 cm, grade 2 breast cancer involving one lymph node of 5 sampled. [ It may have been only 2 lymph nodes that were removed she says.]  [facility] at [facility], currently the [place] [facility] on [address].  The patient says she was told could have more treatment if she wanted it.  She did not see any sense in it.  She feels her left breast cancer was caused by the fact that she kept her iPhone in her bra right by the left breast.  Sometime in April 2018 she noted a new mass in the left breast. She did not immediately bring it to medical attention but as it continued to grow she mentioned her to her primary care physician and on 03/29/2017 Tracy Howell underwent bilateral diagnostic mammography with tomography and left breast ultrasonography at [facility].  The breast density was category B.  In the left breast central to the nipple there was a 6 cm irregular mass with indistinct margins and heterogeneous calcifications.  By ultrasound this measured 5 cm, with indistinct margins, at the 12:00 anterior area.  There was associated edema.  The left axilla was sonographically benign.  Biopsy of the left breast area in  question March 30, 2017 showed ([id number]) invasive ductal carcinoma, grade 3, estrogen receptor 100% positive, progesterone receptor 60% positive, both with strong staining intensity, HER-2 amplified, with a signals ratio of 3.38, and the number per cell 7.95.  The  MIB-1 was 70%.  The patient's subsequent history is as detailed below.  INTERVAL HISTORY: Tracy Howell returns today for further evaluation and treatment of her triple positive breast cancer accompanied by her husband.  She is here today to continue with neoadjuvant treatment with Gemcitabine, Carboplatin, Trastuzumab given on days 1 and 8 of a 21 day cycle.  She receives the Gemcitabine and Carboplatin on day 1 and the Trastuzumab and Gemcitabine on day 8.  Today is cycle 2 day 1.    REVIEW OF SYSTEMS: Tracy Howell is doing well today.  She tells me the skin on her nipple has dried up and fell of two days ago.  She is tolerating treatment relatively well.  She vomited once.  She hasn't noted any further nausea, vomiting.  She has her anti emetics filled and understands how to take them.  She did have some issues with constipation that she was able to resolve with Senokot and by eating prunes with snacks.  She says her hearing and her vision is changing.  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  She thinks her hearing may have changed a little bit.  She says she has to ask her husband to speak louder from time to time and that she has to turn the TV up louder.  Otherwise, a detailed ROS was conducted today and was non  contributory.     PAST MEDICAL HISTORY: Past Medical History:  Diagnosis Date  . Angioedema   . Breast cancer ([place]) 2013  . Complication of anesthesia    slow to wake up   . Pneumonia    hx of x 2   . recurrent left breast ca dx'd 04/2017  . SBO (small bowel obstruction) ([facility] [place]) 08/13/2014    PAST SURGICAL HISTORY: Past Surgical History:  Procedure Laterality Date  . ABDOMINAL HYSTERECTOMY    .  APPENDECTOMY    . BREAST LUMPECTOMY  2013  . BREAST SURGERY    . CESAREAN SECTION     x2  . CHOLECYSTECTOMY    . PORTACATH PLACEMENT Right 05/17/2017   Procedure: INSERTION PORT-A-CATH right subclavian vein;  Surgeon: Alphonsa Overall, MD;  Location: [facility];  Service: General;  Laterality: Right;    FAMILY HISTORY Family History  Problem Relation Age of Onset  . Angioedema Mother   . Breast cancer Mother   . Breast cancer Maternal Aunt   The patient was adopted.  Her adoptive parents died in their 21s.  The patient's birth mother however died in her 68s from breast cancer.  The patient believes she has 2 brothers is not sure about sisters.  However she does have 2 maternal aunts who had breast cancer.  There is no history of ovarian cancer in the family to her knowledge.  The patient has no information regarding her father or his side of the family.   GYNECOLOGIC HISTORY:  No LMP recorded. Patient has had a hysterectomy. Menarche age 64, first live birth age 36, she is [place] P2.  She underwent hysterectomy at age 67.  She tells me they left a fourth of an ovary at that time.  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  She actually lives in [place], [place], and spends 2 weeks here every 2 months at her job.  When she is in town she actually lives with Dr. Criss Rosales. 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  She recently married Tracy Howell who is a Camera operator.  He has 2 children of his own, in [place] and [place].  The patient's own children are Tracy Howell who lives in [place] and works in Engineer, technical sales, and [employer] lives in [place] and owns a [place].  The patient has no grandchildren.  She is 1/7-day [facility]  (She tells me her religion has no bands on any medical treatments but  she cannot have alcohol, cigarettes, or pork).   ADVANCED DIRECTIVES:    HEALTH MAINTENANCE: Social History   Tobacco Use  . Smoking status: Never Smoker  . Smokeless tobacco: Never Used  Substance Use Topics  . Alcohol use: No  . Drug use: No     Colonoscopy:  PAP:  Bone density:   Allergies  Allergen Reactions  . Onion Anaphylaxis, Nausea And Vomiting and Swelling    Throat swelling.  . Other Anaphylaxis and Nausea And Vomiting    Green Peppers-anaphylactic, nausea/vomiting/swelling  Mushrooms-nausea/vomiting.  Patient has hereditary angioedema (HAE)  . Aleve [Naproxen] Other (See Comments)    bleeding  . Codeine Nausea And Vomiting  . Morphine And Related Nausea And Vomiting  . Penicillins Rash    Vomiting Has patient had a PCN reaction causing immediate rash,  facial/tongue/throat swelling, SOB or lightheadedness with hypotension: Yes Has patient had a PCN reaction causing severe rash involving mucus membranes or skin necrosis: Yes Has patient had a PCN reaction that required hospitalization:Was inpatient when reaction occurred. Has patient had a PCN reaction occurring within the last 10 years: No If all of the above answers are "NO", then may proceed with Cephalosporin use.     Current Outpatient Medications  Medication Sig Dispense Refill  . anastrozole (ARIMIDEX) 1 MG tablet Take 1 tablet (1 mg total) by mouth daily. 90 tablet 4  . Ascorbic Acid (VITAMIN C PO) Take 1 tablet by mouth daily.    . B Complex-C (B-COMPLEX WITH VITAMIN C) tablet Take 1 tablet by mouth daily.    . COD LIVER OIL PO Take 1 capsule by mouth daily.    Marland Kitchen dexamethasone (DECADRON) 4 MG tablet Take 2 tablets (8 mg total) by mouth daily. Start the day after chemotherapy for 2 days. Take with food. 30 tablet 1  . lidocaine-prilocaine (EMLA) cream Apply 1 application topically as needed. 30 g 0  . lidocaine-prilocaine (EMLA) cream Apply to affected area once 30 g 3  . LORazepam (ATIVAN) 0.5  MG tablet Take 1 tablet (0.5 mg total) by mouth at bedtime as needed (Nausea or vomiting). 30 tablet 0  . Multiple Vitamin (MULTIVITAMIN WITH MINERALS) TABS tablet Take 1 tablet by mouth daily.    . prochlorperazine (COMPAZINE) 10 MG tablet Take 1 tablet (10 mg total) by mouth every 6 (six) hours as needed (Nausea or vomiting). 30 tablet 1  . traMADol (ULTRAM) 50 MG tablet Take 1 tablet (50 mg total) by mouth every 6 (six) hours as needed. 30 tablet 0  . triamterene-hydrochlorothiazide (DYAZIDE) 37.5-25 MG capsule Take 1 each (1 capsule total) by mouth daily. 30 capsule 0   No current facility-administered medications for this visit.     OBJECTIVE:   Vitals:   11/23/17 0848  BP: (!) 165/61  Pulse: 91  Resp: 18  Temp: 98.4 F (36.9 C)  SpO2: 99%     Body mass index is 35.72 kg/m.   Wt Readings from Last 3 Encounters:  11/23/17 224 lb 11.2 oz (101.9 kg)  11/01/17 229 lb (103.9 kg)  10/20/17 232 lb 12.8 oz (105.6 kg)  ECOG FS:1 - Symptomatic but completely ambulatory GENERAL: Patient is a well appearing female in no acute distress HEENT:  Sclerae anicteric.  Oropharynx clear and moist. No ulcerations or evidence of oropharyngeal candidiasis. Neck is supple. Right ear canal impacted with cerumen.   NODES:  No cervical, supraclavicular, or axillary lymphadenopathy palpated.  BREAST EXAM:  See picture below, tumor feels softer and appears less prominent LUNGS:  Clear to auscultation bilaterally.  No wheezes or rhonchi. HEART:  Regular rate and rhythm. No murmur appreciated. ABDOMEN:  Soft, nontender.  Positive, normoactive bowel sounds. No organomegaly palpated. MSK:  No focal spinal tenderness to palpation. Full range of motion bilaterally in the upper extremities. EXTREMITIES:  No peripheral edema.   SKIN:  Clear with no obvious rashes or skin changes. No nail dyscrasia. NEURO:  Nonfocal. Well oriented.  Appropriate affect.  Breast on 11/01/2017   Breast on 11/23/2017   LAB  RESULTS:  CMP     Component Value Date/Time   NA 142 11/23/2017 0816   NA 142 05/12/2017 0808   K 3.4 (L) 11/23/2017 0816   K 3.5 05/12/2017 0808   CL 107 11/23/2017 0816   CO2 26 11/23/2017 0816   CO2  26 05/12/2017 0808   GLUCOSE 143 (H) 11/23/2017 0816   GLUCOSE 133 05/12/2017 0808   BUN 6 (L) 11/23/2017 0816   BUN 9.8 05/12/2017 0808   CREATININE 0.76 11/23/2017 0816   CREATININE 0.9 05/12/2017 0808   CALCIUM 9.2 11/23/2017 0816   CALCIUM 9.3 05/12/2017 0808   PROT 7.6 11/23/2017 0816   PROT 7.3 05/12/2017 0808   ALBUMIN 3.0 (L) 11/23/2017 0816   ALBUMIN 3.3 (L) 05/12/2017 0808   AST 32 11/23/2017 0816   AST 15 05/12/2017 0808   ALT 13 11/23/2017 0816   ALT 10 05/12/2017 0808   ALKPHOS 107 11/23/2017 0816   ALKPHOS 90 05/12/2017 0808   BILITOT 0.6 11/23/2017 0816   BILITOT 0.56 05/12/2017 0808   GFRNONAA >60 11/23/2017 0816   GFRAA >60 11/23/2017 0816    No results found for: Ronnald Ramp, A1GS, A2GS, BETS, BETA2SER, GAMS, MSPIKE, SPEI  No results found for: Nils [place], [facility]  Lab Results  Component Value Date   WBC 4.8 11/23/2017   NEUTROABS 2.6 11/23/2017   HGB 10.4 (L) 11/23/2017   HCT 31.2 (L) 11/23/2017   MCV 88.4 11/23/2017   PLT 373 11/23/2017      Chemistry      Component Value Date/Time   NA 142 11/23/2017 0816   NA 142 05/12/2017 0808   K 3.4 (L) 11/23/2017 0816   K 3.5 05/12/2017 0808   CL 107 11/23/2017 0816   CO2 26 11/23/2017 0816   CO2 26 05/12/2017 0808   BUN 6 (L) 11/23/2017 0816   BUN 9.8 05/12/2017 0808   CREATININE 0.76 11/23/2017 0816   CREATININE 0.9 05/12/2017 0808      Component Value Date/Time   CALCIUM 9.2 11/23/2017 0816   CALCIUM 9.3 05/12/2017 0808   ALKPHOS 107 11/23/2017 0816   ALKPHOS 90 05/12/2017 0808   AST 32 11/23/2017 0816   AST 15 05/12/2017 0808   ALT 13 11/23/2017 0816   ALT 10 05/12/2017 0808   BILITOT 0.6 11/23/2017 0816   BILITOT 0.56 05/12/2017 0808       No  results found for: LABCA2  No components found for: [id number]  No results for input(s): INR in the last 168 hours.  No results found for: LABCA2  No results found for: CAN199  No results found for: EVO350  No results found for: KXF818  Lab Results  Component Value Date   CA2729 26.4 11/01/2017    No components found for: HGQUANT  No results found for: CEA1 / No results found for: CEA1   No results found for: AFPTUMOR  No results found for: CHROMOGRNA  No results found for: PSA1  Appointment on 11/23/2017  Component Date Value Ref Range Status  . WBC Count 11/23/2017 4.8  3.9 - 10.3 K/uL Final  . RBC 11/23/2017 3.53* 3.70 - 5.45 MIL/uL Final  . Hemoglobin 11/23/2017 10.4* 11.6 - 15.9 g/dL Final  . HCT 11/23/2017 31.2* 34.8 - 46.6 % Final  . MCV 11/23/2017 88.4  79.5 - 101.0 fL Final  . MCH 11/23/2017 29.6  25.1 - 34.0 pg Final  . MCHC 11/23/2017 33.4  31.5 - 36.0 g/dL Final  . RDW 11/23/2017 17.8* 11.2 - 14.5 % Final  . Platelet Count 11/23/2017 373  145 - 400 K/uL Final  . Neutrophils Relative % 11/23/2017 54  % Final  . Neutro Abs 11/23/2017 2.6  1.5 - 6.5 K/uL Final  . Lymphocytes Relative 11/23/2017 29  % Final  . Lymphs Abs 11/23/2017  1.4  0.9 - 3.3 K/uL Final  . Monocytes Relative 11/23/2017 13  % Final  . Monocytes Absolute 11/23/2017 0.6  0.1 - 0.9 K/uL Final  . Eosinophils Relative 11/23/2017 3  % Final  . Eosinophils Absolute 11/23/2017 0.1  0.0 - 0.5 K/uL Final  . Basophils Relative 11/23/2017 1  % Final  . Basophils Absolute 11/23/2017 0.1  0.0 - 0.1 K/uL Final   Performed at [facility], [place] [address], [place], [place] [address]  . Sodium 11/23/2017 142  136 - 145 mmol/L Final  . Potassium 11/23/2017 3.4* 3.5 - 5.1 mmol/L Final  . Chloride 11/23/2017 107  98 - 109 mmol/L Final  . CO2 11/23/2017 26  22 - 29 mmol/L Final  . Glucose, Bld 11/23/2017 143* 70 - 140 mg/dL Final  . BUN 11/23/2017 6* 7 - 26 mg/dL Final  .  Creatinine 11/23/2017 0.76  0.60 - 1.10 mg/dL Final  . Calcium 11/23/2017 9.2  8.4 - 10.4 mg/dL Final  . Total Protein 11/23/2017 7.6  6.4 - 8.3 g/dL Final  . Albumin 11/23/2017 3.0* 3.5 - 5.0 g/dL Final  . AST 11/23/2017 32  5 - 34 U/L Final  . ALT 11/23/2017 13  0 - 55 U/L Final  . Alkaline Phosphatase 11/23/2017 107  40 - 150 U/L Final  . Total Bilirubin 11/23/2017 0.6  0.2 - 1.2 mg/dL Final  . GFR, Est Non Af Am 11/23/2017 >60  >60 mL/min Final  . GFR, Est AFR Am 11/23/2017 >60  >60 mL/min Final   Comment: (NOTE) The eGFR has been calculated using the CKD EPI equation. This calculation has not been validated in all clinical situations. eGFR's persistently <60 mL/min signify possible Chronic Kidney Disease.   Georgiann Hahn gap 11/23/2017 9  3 - 11 Final   Performed at [facility], [address] [place], [place] [address]    (this displays the last labs from the last 3 days)  No results found for: TOTALPROTELP, ALBUMINELP, A1GS, A2GS, BETS, BETA2SER, GAMS, MSPIKE, SPEI (this displays SPEP labs)  No results found for: KPAFRELGTCHN, LAMBDASER, KAPLAMBRATIO (kappa/lambda light chains)  No results found for: HGBA, HGBA2QUANT, HGBFQUANT, HGBSQUAN (Hemoglobinopathy evaluation)   No results found for: LDH  No results found for: IRON, TIBC, IRONPCTSAT (Iron and TIBC)  No results found for: FERRITIN  Urinalysis    Component Value Date/Time   COLORURINE YELLOW 08/10/2017 0924   APPEARANCEUR CLEAR 08/10/2017 0924   LABSPEC 1.010 08/10/2017 0924   PHURINE 6.5 08/10/2017 [place] 08/10/2017 0924   HGBUR NEGATIVE 08/10/2017 0924   BILIRUBINUR NEGATIVE 08/10/2017 0924   KETONESUR NEGATIVE 08/10/2017 0924   PROTEINUR NEGATIVE 08/10/2017 0924   NITRITE NEGATIVE 08/10/2017 0924   LEUKOCYTESUR SMALL (A) 08/10/2017 0924     STUDIES: Repeat echocardiogram scheduled for 09/23/2017  ELIGIBLE FOR AVAILABLE RESEARCH PROTOCOL:no  ASSESSMENT: 72  y.o. [place] woman (primarily residing in [place])  (1) status post left lumpectomy in 2012 for a reported pT2 pN1 breast cancer, the patient refusing adjuvant treatment (no chemotherapy, radiation, or antiestrogens).  METASTATIC DISEASE: December 2018 (bilateral triple positive disease) (2) status post left breast biopsy 03/30/2017 for a clinical T4 N0, stage IIIB invasive ductal carcinoma, grade 3, triple positive, with an MIB-1 of 70%  (a) staging studies 05/21/2017 show multiple bilateral pulmonary nodules consistent with stage IV disease 05/21/2017; there are no liver or bone lesions noted  (b) breast MRI 05/13/2017 shows a 7 cm area  of non-masslike enhancement in the right breast, with biopsy ([id number]) on 06/24/2017 showing: Metastatic carcinoma. Prognostic panel was strongly ER and PR positive as well as HER-2 positive with the signals ratio of 2.61 and number per cell 5.35 (triple positive)  (3) neoadjuvant chemotherapy with T-DM1 started 06/29/2016  (a) echocardiogram 06/24/2017 showed an ejection fraction of 60-65 %  (B) echocardiogram 09/23/2017 showed an ejection fraction of 65-70%  (c) CT chest on 10/12/2017 shows progression in nodes, pulmonary nodules, and bone mets  (4) definitive surgery to follow once left chest wall disease under better control  (5) anastrozole written on 09/21/2017, never started by patient  (a) patient states was unable to tolerate tamoxifen in the past (nausea).  (6) genetics testing pending  (7) chemotherapy with carboplatin, gemcitabine, and trastuzumab to start 11/01/2017, with Gemcitabine and Carboplatin given on day 1, and Gemcitabine and Trastuzumab given on day 8 to be repeated every 21 days x 6, or to optimal response  PLAN:  Tracy Howell is doing well today.  I reviewed her CBC with her which is stable.  She is concerned about her hemoglobin being 10.4.  She notes she takes a multivitamin with iron in it.  We reviewed that it is normal to see a  decrease in her hemoglobin with the chemotherapy and we will monitor it closely.  She will proceed with cycle 2 day 1 of treatment today so long as her CMET is within parameters.  It is promising that her tumor is already showing signs of shrinkage.    Tracy Howell's hearing change is likely due to the fact that cerumen is impacted in her right ear.  She can use an OTC ear wax removal kit or DEBROX to help get this out.    We will see Tracy Howell back in 1 week for labs, f/u, and her day 8 of her treatment.  Tracy Howell knows to call for any questions or concerns prior to her next appointment with [place].    A total of (30) minutes of face-to-face time was spent with this patient with greater than 50% of that time in counseling and care-coordination.  Wilber Bihari, NP   11/23/17 9:24 AM Medical Oncology and Hematology [facility] [address] [place], [place] [address] Tel. [phone]    Fax. [phone]

## 2017-11-23 NOTE — Progress Notes (Signed)
Left breast [phone]

## 2017-11-23 NOTE — Patient Instructions (Addendum)
[facility] Discharge Instructions for Patients Receiving Chemotherapy  Today you received the following chemotherapy agents:  Gemzar, Carboplatin  To help prevent nausea and vomiting after your treatment, we encourage you to take your nausea medication as prescribed.   If you develop nausea and vomiting that is not controlled by your nausea medication, call the clinic.   BELOW ARE SYMPTOMS THAT SHOULD BE REPORTED IMMEDIATELY:  *FEVER GREATER THAN 100.5 F  *CHILLS WITH OR WITHOUT FEVER  NAUSEA AND VOMITING THAT IS NOT CONTROLLED WITH YOUR NAUSEA MEDICATION  *UNUSUAL SHORTNESS OF BREATH  *UNUSUAL BRUISING OR BLEEDING  TENDERNESS IN MOUTH AND THROAT WITH OR WITHOUT PRESENCE OF ULCERS  *URINARY PROBLEMS  *BOWEL PROBLEMS  UNUSUAL RASH Items with * indicate a potential emergency and should be followed up as soon as possible.  Feel free to call the clinic should you have any questions or concerns. The clinic phone number is [phone].  Please show the CHEMO ALERT CARD at check-in to the Emergency Department and triage nurse.   

## 2017-11-23 NOTE — Telephone Encounter (Signed)
Per 6/11 no los

## 2017-11-24 LAB — CANCER ANTIGEN 27.29: CA 27.29: 27.6 U/mL (ref 0.0–38.6)

## 2017-11-30 ENCOUNTER — Inpatient Hospital Stay: Payer: Medicare Other

## 2017-11-30 ENCOUNTER — Inpatient Hospital Stay (HOSPITAL_BASED_OUTPATIENT_CLINIC_OR_DEPARTMENT_OTHER): Payer: Medicare Other | Admitting: Adult Health

## 2017-11-30 ENCOUNTER — Other Ambulatory Visit: Payer: Self-pay | Admitting: Oncology

## 2017-11-30 ENCOUNTER — Encounter: Payer: Self-pay | Admitting: Adult Health

## 2017-11-30 VITALS — BP 149/55 | HR 90 | Temp 98.5°F | Resp 18 | Ht 66.5 in | Wt 224.7 lb

## 2017-11-30 DIAGNOSIS — C7951 Secondary malignant neoplasm of bone: Secondary | ICD-10-CM | POA: Diagnosis not present

## 2017-11-30 DIAGNOSIS — C50212 Malignant neoplasm of upper-inner quadrant of left female breast: Secondary | ICD-10-CM | POA: Diagnosis not present

## 2017-11-30 DIAGNOSIS — Z803 Family history of malignant neoplasm of breast: Secondary | ICD-10-CM

## 2017-11-30 DIAGNOSIS — C7801 Secondary malignant neoplasm of right lung: Secondary | ICD-10-CM

## 2017-11-30 DIAGNOSIS — Z5189 Encounter for other specified aftercare: Secondary | ICD-10-CM | POA: Diagnosis not present

## 2017-11-30 DIAGNOSIS — Z170 Estrogen receptor positive status [ER+]: Principal | ICD-10-CM

## 2017-11-30 DIAGNOSIS — C7802 Secondary malignant neoplasm of left lung: Secondary | ICD-10-CM

## 2017-11-30 DIAGNOSIS — Z5112 Encounter for antineoplastic immunotherapy: Secondary | ICD-10-CM | POA: Diagnosis not present

## 2017-11-30 DIAGNOSIS — G629 Polyneuropathy, unspecified: Secondary | ICD-10-CM

## 2017-11-30 DIAGNOSIS — Z853 Personal history of malignant neoplasm of breast: Secondary | ICD-10-CM | POA: Diagnosis not present

## 2017-11-30 DIAGNOSIS — Z79899 Other long term (current) drug therapy: Secondary | ICD-10-CM | POA: Diagnosis not present

## 2017-11-30 DIAGNOSIS — C7800 Secondary malignant neoplasm of unspecified lung: Secondary | ICD-10-CM

## 2017-11-30 DIAGNOSIS — Z95828 Presence of other vascular implants and grafts: Secondary | ICD-10-CM

## 2017-11-30 DIAGNOSIS — D6481 Anemia due to antineoplastic chemotherapy: Secondary | ICD-10-CM

## 2017-11-30 DIAGNOSIS — D701 Agranulocytosis secondary to cancer chemotherapy: Secondary | ICD-10-CM

## 2017-11-30 DIAGNOSIS — Z5111 Encounter for antineoplastic chemotherapy: Secondary | ICD-10-CM | POA: Diagnosis not present

## 2017-11-30 LAB — CMP (CANCER CENTER ONLY)
ALK PHOS: 118 U/L (ref 40–150)
ALT: 24 U/L (ref 0–55)
ANION GAP: 9 (ref 3–11)
AST: 47 U/L — ABNORMAL HIGH (ref 5–34)
Albumin: 3 g/dL — ABNORMAL LOW (ref 3.5–5.0)
BUN: 7 mg/dL (ref 7–26)
CALCIUM: 9.1 mg/dL (ref 8.4–10.4)
CO2: 26 mmol/L (ref 22–29)
Chloride: 107 mmol/L (ref 98–109)
Creatinine: 0.72 mg/dL (ref 0.60–1.10)
GFR, Est AFR Am: >60 mL/min (ref >60)
GFR, Estimated: >60 mL/min (ref >60)
Glucose, Bld: 117 mg/dL (ref 70–140)
POTASSIUM: 3.4 mmol/L — AB (ref 3.5–5.1)
SODIUM: 142 mmol/L (ref 136–145)
Total Bilirubin: 0.3 mg/dL (ref 0.2–1.2)
Total Protein: 7.4 g/dL (ref 6.4–8.3)

## 2017-11-30 LAB — CBC WITH DIFFERENTIAL (CANCER CENTER ONLY)
BASOS ABS: 0 10*3/uL (ref 0.0–0.1)
BASOS PCT: 1 %
EOS ABS: 0 10*3/uL (ref 0.0–0.5)
Eosinophils Relative: 1 %
HCT: 29.9 % — ABNORMAL LOW (ref 34.8–46.6)
Hemoglobin: 9.9 g/dL — ABNORMAL LOW (ref 11.6–15.9)
Lymphocytes Relative: 63 %
Lymphs Abs: 3.2 10*3/uL (ref 0.9–3.3)
MCH: 29.2 pg (ref 25.1–34.0)
MCHC: 33 g/dL (ref 31.5–36.0)
MCV: 88.5 fL (ref 79.5–101.0)
Monocytes Absolute: 0.5 10*3/uL (ref 0.1–0.9)
Monocytes Relative: 10 %
Neutro Abs: 1.3 10*3/uL — ABNORMAL LOW (ref 1.5–6.5)
Neutrophils Relative %: 25 %
Platelet Count: 363 10*3/uL (ref 145–400)
RBC: 3.38 MIL/uL — AB (ref 3.70–5.45)
RDW: 17.4 % — ABNORMAL HIGH (ref 11.2–14.5)
WBC: 5.1 10*3/uL (ref 3.9–10.3)

## 2017-11-30 MED ORDER — ACETAMINOPHEN 325 MG PO TABS
650.0000 mg | ORAL_TABLET | Freq: Once | ORAL | Status: AC
  Administered 2017-11-30: 650 mg via ORAL

## 2017-11-30 MED ORDER — PALONOSETRON HCL INJECTION 0.25 MG/5ML
0.2500 mg | Freq: Once | INTRAVENOUS | Status: AC
  Administered 2017-11-30: 0.25 mg via INTRAVENOUS

## 2017-11-30 MED ORDER — PEGFILGRASTIM 6 MG/0.6ML ~~LOC~~ PSKT
6.0000 mg | PREFILLED_SYRINGE | Freq: Once | SUBCUTANEOUS | Status: AC
  Administered 2017-11-30: 6 mg via SUBCUTANEOUS

## 2017-11-30 MED ORDER — SODIUM CHLORIDE 0.9% FLUSH
10.0000 mL | Freq: Once | INTRAVENOUS | Status: AC
  Administered 2017-11-30: 10 mL
  Filled 2017-11-30: qty 10

## 2017-11-30 MED ORDER — ACETAMINOPHEN 325 MG PO TABS
ORAL_TABLET | ORAL | Status: AC
  Filled 2017-11-30: qty 2

## 2017-11-30 MED ORDER — DIPHENHYDRAMINE HCL 25 MG PO CAPS
ORAL_CAPSULE | ORAL | Status: AC
  Filled 2017-11-30: qty 1

## 2017-11-30 MED ORDER — HEPARIN SOD (PORK) LOCK FLUSH 100 UNIT/ML IV SOLN
500.0000 [IU] | Freq: Once | INTRAVENOUS | Status: AC | PRN
  Administered 2017-11-30: 500 [IU]
  Filled 2017-11-30: qty 5

## 2017-11-30 MED ORDER — PEGFILGRASTIM 6 MG/0.6ML ~~LOC~~ PSKT
PREFILLED_SYRINGE | SUBCUTANEOUS | Status: AC
  Filled 2017-11-30: qty 0.6

## 2017-11-30 MED ORDER — DEXAMETHASONE SODIUM PHOSPHATE 10 MG/ML IJ SOLN
INTRAMUSCULAR | Status: AC
Start: 2017-11-30 — End: ?
  Filled 2017-11-30: qty 1

## 2017-11-30 MED ORDER — SODIUM CHLORIDE 0.9 % IV SOLN
800.0000 mg/m2 | Freq: Once | INTRAVENOUS | Status: AC
  Administered 2017-11-30: 1786 mg via INTRAVENOUS
  Filled 2017-11-30: qty 47

## 2017-11-30 MED ORDER — PALONOSETRON HCL INJECTION 0.25 MG/5ML
INTRAVENOUS | Status: AC
Start: 2017-11-30 — End: ?
  Filled 2017-11-30: qty 5

## 2017-11-30 MED ORDER — SODIUM CHLORIDE 0.9 % IV SOLN
Freq: Once | INTRAVENOUS | Status: AC
  Administered 2017-11-30: 10:00:00 via INTRAVENOUS

## 2017-11-30 MED ORDER — DEXAMETHASONE SODIUM PHOSPHATE 10 MG/ML IJ SOLN
10.0000 mg | Freq: Once | INTRAMUSCULAR | Status: AC
  Administered 2017-11-30: 10 mg via INTRAVENOUS

## 2017-11-30 MED ORDER — SODIUM CHLORIDE 0.9 % IV SOLN
214.6000 mg | Freq: Once | INTRAVENOUS | Status: AC
  Administered 2017-11-30: 210 mg via INTRAVENOUS
  Filled 2017-11-30: qty 21

## 2017-11-30 MED ORDER — TRASTUZUMAB CHEMO 150 MG IV SOLR
600.0000 mg | Freq: Once | INTRAVENOUS | Status: AC
  Administered 2017-11-30: 600 mg via INTRAVENOUS
  Filled 2017-11-30: qty 28.6

## 2017-11-30 MED ORDER — SODIUM CHLORIDE 0.9% FLUSH
10.0000 mL | INTRAVENOUS | Status: DC | PRN
  Administered 2017-11-30: 10 mL
  Filled 2017-11-30: qty 10

## 2017-11-30 MED ORDER — DIPHENHYDRAMINE HCL 25 MG PO CAPS
25.0000 mg | ORAL_CAPSULE | Freq: Once | ORAL | Status: AC
  Administered 2017-11-30: 25 mg via ORAL

## 2017-11-30 NOTE — Progress Notes (Signed)
Per Thedore Mins, NP, ok to treat with current ANC.

## 2017-11-30 NOTE — Progress Notes (Signed)
[facility]  [phone] [phone]     ID: Tracy Howell DOB: [birth year]  MR#: [id number]  JYN#:[id number]  Patient Care Team: Lucianne Lei, MD as PCP - General (Family Medicine) Alphonsa Overall, MD as Consulting Physician (General Surgery) Magrinat, Virgie Dad, MD as Consulting Physician (Oncology) Eppie Gibson, MD as Attending Physician (Radiation Oncology) Juanita Craver, MD as Consulting Physician (Gastroenterology) OTHER MD:  CHIEF COMPLAINT: triple positive bilateral breast cancer  CURRENT TREATMENT: Gemcitabine, Carboplatin, Trastuzumab   HISTORY OF CURRENT ILLNESS: From the original intake note:  "Tracy Howell" tells me she underwent left lumpectomy in [place] on 2012 for a 2.5 cm, grade 2 breast cancer involving one lymph node of 5 sampled. [ It may have been only 2 lymph nodes that were removed she says.]  This was at the [facility], currently the [place] [facility] [address].  The patient says she was told could have more treatment if she wanted it.  She did not see any sense in it.  She feels her left breast cancer was caused by the fact that she kept her iPhone in her bra right by the left breast.  Sometime in April 2018 she noted a new mass in the left breast. She did not immediately bring it to medical attention but as it continued to grow she mentioned her to her primary care physician and on 03/29/2017 Tracy Howell underwent bilateral diagnostic mammography with tomography and left breast ultrasonography at [facility].  The breast density was category B.  In the left breast central to the nipple there was a 6 cm irregular mass with indistinct margins and heterogeneous calcifications.  By ultrasound this measured 5 cm, with indistinct margins, at the 12:00 anterior area.  There was associated edema.  The left axilla was sonographically benign.  Biopsy of the left breast area in question March 30, 2017 showed ([id number]) invasive ductal carcinoma, grade 3, estrogen receptor 100% positive, progesterone receptor 60% positive, both with strong staining intensity, HER-2 amplified, with a signals ratio of 3.38, and the number per cell 7.95.  The  MIB-1 was 70%.  The patient's subsequent history is as detailed below.  INTERVAL HISTORY: Tracy Howell returns today for further evaluation and treatment of her triple positive breast cancer accompanied by her husband.  She is here today to continue with neoadjuvant treatment with Gemcitabine, Carboplatin, Trastuzumab given on days 1 and 8 of a 21 day cycle.  She receives the Gemcitabine and Carboplatin on day 1 and the Trastuzumab and Gemcitabine and carboplatin on day 8.  Today is cycle 2 day 8.    REVIEW OF SYSTEMS: Tracy Howell is doing well today.  She says she feels much better there more she receives the chemotherapy, because it is working so well her pain has decreased drastically.  She is very happy with how much it is shrinking.  She continues to have intermittent residual peripheral neuropathy that was present before she started this treatment, however, it has not worsened.  She denies any other issues today and a detailed ROS was non contributory.     PAST MEDICAL HISTORY: Past Medical History:  Diagnosis Date  . Angioedema   . Breast cancer ([facility]) 2013  . Complication of anesthesia    slow to wake up   . Pneumonia    hx of x 2   . recurrent left breast ca dx'd 04/2017  . SBO (small bowel obstruction) ([facility]) 08/13/2014    PAST SURGICAL  HISTORY: Past Surgical History:  Procedure Laterality Date  . ABDOMINAL HYSTERECTOMY    . APPENDECTOMY    . BREAST LUMPECTOMY  2013  . BREAST SURGERY    . CESAREAN SECTION     x2  . CHOLECYSTECTOMY    . PORTACATH PLACEMENT Right 05/17/2017   Procedure: INSERTION PORT-A-CATH right subclavian vein;  Surgeon: Alphonsa Overall, MD;  Location: [facility];  Service: General;  Laterality: Right;    FAMILY  HISTORY Family History  Problem Relation Age of Onset  . Angioedema Mother   . Breast cancer Mother   . Breast cancer Maternal Aunt   The patient was adopted.  Her adoptive parents died in their 46s.  The patient's birth mother however died in her 61s from breast cancer.  The patient believes she has 2 brothers is not sure about sisters.  However she does have 2 maternal aunts who had breast cancer.  There is no history of ovarian cancer in the family to her knowledge.  The patient has no information regarding her father or his side of the family.   GYNECOLOGIC HISTORY:  No LMP recorded. Patient has had a hysterectomy. Menarche age 51, first live birth age 58, she is [place] P2.  She underwent hysterectomy at age 62.  She tells me they left a fourth of an ovary at that time.  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  She actually lives in [place], [place], and spends 2 weeks here every 2 months at her job.  When she is in town she actually lives with Dr. Criss Rosales. 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  She recently married Tracy Howell who is a Camera operator.  He has 2 children of his own, in [place] and [place].  The patient's own children are Tracy Howell who lives in [place] and works in Engineer, technical sales, and [employer] lives in [place] and owns a [employer].  The patient has no grandchildren.  She is 1/7-day [facility].  (She tells me her religion has no bands on any medical treatments but she cannot have alcohol, cigarettes, or pork).   ADVANCED DIRECTIVES:    HEALTH MAINTENANCE: Social History   Tobacco Use  . Smoking status: Never Smoker  . Smokeless tobacco: Never Used  Substance Use Topics  . Alcohol use: No  . Drug use: No     Colonoscopy:  PAP:  Bone density:   Allergies    Allergen Reactions  . Onion Anaphylaxis, Nausea And Vomiting and Swelling    Throat swelling.  . Other Anaphylaxis and Nausea And Vomiting    Green Peppers-anaphylactic, nausea/vomiting/swelling  Mushrooms-nausea/vomiting.  Patient has hereditary angioedema (HAE)  . Aleve [Naproxen] Other (See Comments)    bleeding  . Codeine Nausea And Vomiting  . Morphine And Related Nausea And Vomiting  . Penicillins Rash    Vomiting Has patient had a PCN reaction causing immediate rash, facial/tongue/throat swelling, SOB or lightheadedness with hypotension: Yes Has patient had a PCN reaction causing severe rash involving mucus membranes or skin necrosis: Yes Has patient had a PCN reaction that required hospitalization:Was inpatient when reaction occurred. Has patient had a PCN reaction occurring within the last 10 years: No If all of the above answers are "NO", then may proceed with Cephalosporin use.  Current Outpatient Medications  Medication Sig Dispense Refill  . anastrozole (ARIMIDEX) 1 MG tablet Take 1 tablet (1 mg total) by mouth daily. 90 tablet 4  . Ascorbic Acid (VITAMIN C PO) Take 1 tablet by mouth daily.    . B Complex-C (B-COMPLEX WITH VITAMIN C) tablet Take 1 tablet by mouth daily.    . COD LIVER OIL PO Take 1 capsule by mouth daily.    Marland Kitchen dexamethasone (DECADRON) 4 MG tablet Take 2 tablets (8 mg total) by mouth daily. Start the day after chemotherapy for 2 days. Take with food. 30 tablet 1  . lidocaine-prilocaine (EMLA) cream Apply 1 application topically as needed. 30 g 0  . lidocaine-prilocaine (EMLA) cream Apply to affected area once 30 g 3  . LORazepam (ATIVAN) 0.5 MG tablet Take 1 tablet (0.5 mg total) by mouth at bedtime as needed (Nausea or vomiting). 30 tablet 0  . Multiple Vitamin (MULTIVITAMIN WITH MINERALS) TABS tablet Take 1 tablet by mouth daily.    . prochlorperazine (COMPAZINE) 10 MG tablet Take 1 tablet (10 mg total) by mouth every 6 (six) hours as needed  (Nausea or vomiting). 30 tablet 1  . traMADol (ULTRAM) 50 MG tablet Take 1 tablet (50 mg total) by mouth every 6 (six) hours as needed. 30 tablet 0  . triamterene-hydrochlorothiazide (DYAZIDE) 37.5-25 MG capsule Take 1 each (1 capsule total) by mouth daily. 30 capsule 0   No current facility-administered medications for this visit.    Facility-Administered Medications Ordered in Other Visits  Medication Dose Route Frequency Provider Last Rate Last Dose  . sodium chloride flush (NS) 0.9 % injection 10 mL  10 mL Intracatheter PRN Magrinat, Virgie Dad, MD   10 mL at 11/30/17 1338    OBJECTIVE:   Vitals:   11/30/17 0930  BP: (!) 149/55  Pulse: 90  Resp: 18  Temp: 98.5 F (36.9 C)  SpO2: 98%     Body mass index is 35.72 kg/m.   Wt Readings from Last 3 Encounters:  11/30/17 224 lb 11.2 oz (101.9 kg)  11/23/17 224 lb 11.2 oz (101.9 kg)  11/01/17 229 lb (103.9 kg)  ECOG FS:1 - Symptomatic but completely ambulatory GENERAL: Patient is a well appearing female in no acute distress HEENT:  Sclerae anicteric.  Oropharynx clear and moist. No ulcerations or evidence of oropharyngeal candidiasis. Neck is supple. Right ear canal impacted with cerumen.   NODES:  No cervical, supraclavicular, or axillary lymphadenopathy palpated.  BREAST EXAM:  See picture below, tumor feels softer and appears less prominent LUNGS:  Clear to auscultation bilaterally.  No wheezes or rhonchi. HEART:  Regular rate and rhythm. No murmur appreciated. ABDOMEN:  Soft, nontender.  Positive, normoactive bowel sounds. No organomegaly palpated. MSK:  No focal spinal tenderness to palpation. Full range of motion bilaterally in the upper extremities. EXTREMITIES:  No peripheral edema.   SKIN:  Clear with no obvious rashes or skin changes. No nail dyscrasia. NEURO:  Nonfocal. Well oriented.  Appropriate affect.  Breast on 11/01/2017   Breast on 11/23/2017   LAB RESULTS:  CMP     Component Value Date/Time   NA 142  11/30/2017 0844   NA 142 05/12/2017 0808   K 3.4 (L) 11/30/2017 0844   K 3.5 05/12/2017 0808   CL 107 11/30/2017 0844   CO2 26 11/30/2017 0844   CO2 26 05/12/2017 0808   GLUCOSE 117 11/30/2017 0844   GLUCOSE 133 05/12/2017 0808   BUN 7 11/30/2017 0844  BUN 9.8 05/12/2017 0808   CREATININE 0.72 11/30/2017 0844   CREATININE 0.9 05/12/2017 0808   CALCIUM 9.1 11/30/2017 0844   CALCIUM 9.3 05/12/2017 0808   PROT 7.4 11/30/2017 0844   PROT 7.3 05/12/2017 0808   ALBUMIN 3.0 (L) 11/30/2017 0844   ALBUMIN 3.3 (L) 05/12/2017 0808   AST 47 (H) 11/30/2017 0844   AST 15 05/12/2017 0808   ALT 24 11/30/2017 0844   ALT 10 05/12/2017 0808   ALKPHOS 118 11/30/2017 0844   ALKPHOS 90 05/12/2017 0808   BILITOT 0.3 11/30/2017 0844   BILITOT 0.56 05/12/2017 0808   GFRNONAA >60 11/30/2017 0844   GFRAA >60 11/30/2017 0844    No results found for: Ronnald Ramp, A1GS, A2GS, BETS, BETA2SER, GAMS, MSPIKE, SPEI  No results found for: Nils Pyle, [facility]  Lab Results  Component Value Date   WBC 5.1 11/30/2017   NEUTROABS 1.3 (L) 11/30/2017   HGB 9.9 (L) 11/30/2017   HCT 29.9 (L) 11/30/2017   MCV 88.5 11/30/2017   PLT 363 11/30/2017      Chemistry      Component Value Date/Time   NA 142 11/30/2017 0844   NA 142 05/12/2017 0808   K 3.4 (L) 11/30/2017 0844   K 3.5 05/12/2017 0808   CL 107 11/30/2017 0844   CO2 26 11/30/2017 0844   CO2 26 05/12/2017 0808   BUN 7 11/30/2017 0844   BUN 9.8 05/12/2017 0808   CREATININE 0.72 11/30/2017 0844   CREATININE 0.9 05/12/2017 0808      Component Value Date/Time   CALCIUM 9.1 11/30/2017 0844   CALCIUM 9.3 05/12/2017 0808   ALKPHOS 118 11/30/2017 0844   ALKPHOS 90 05/12/2017 0808   AST 47 (H) 11/30/2017 0844   AST 15 05/12/2017 0808   ALT 24 11/30/2017 0844   ALT 10 05/12/2017 0808   BILITOT 0.3 11/30/2017 0844   BILITOT 0.56 05/12/2017 0808       No results found for: LABCA2  No components found for:  [id number]  No results for input(s): INR in the last 168 hours.  No results found for: LABCA2  No results found for: CAN199  No results found for: WUJ811  No results found for: BJY782  Lab Results  Component Value Date   CA2729 27.6 11/23/2017    No components found for: HGQUANT  No results found for: CEA1 / No results found for: CEA1   No results found for: AFPTUMOR  No results found for: CHROMOGRNA  No results found for: PSA1  Appointment on 11/30/2017  Component Date Value Ref Range Status  . WBC Count 11/30/2017 5.1  3.9 - 10.3 K/uL Final  . RBC 11/30/2017 3.38* 3.70 - 5.45 MIL/uL Final  . Hemoglobin 11/30/2017 9.9* 11.6 - 15.9 g/dL Final  . HCT 11/30/2017 29.9* 34.8 - 46.6 % Final  . MCV 11/30/2017 88.5  79.5 - 101.0 fL Final  . MCH 11/30/2017 29.2  25.1 - 34.0 pg Final  . MCHC 11/30/2017 33.0  31.5 - 36.0 g/dL Final  . RDW 11/30/2017 17.4* 11.2 - 14.5 % Final  . Platelet Count 11/30/2017 363  145 - 400 K/uL Final  . Smear Review 11/30/2017 ATYPICAL LYMPHS   Final   SMUDGE CELLS  . Neutrophils Relative % 11/30/2017 25  % Final  . Neutro Abs 11/30/2017 1.3* 1.5 - 6.5 K/uL Final  . Lymphocytes Relative 11/30/2017 63  % Final  . Lymphs Abs 11/30/2017 3.2  0.9 - 3.3 K/uL Final  . Monocytes  Relative 11/30/2017 10  % Final  . Monocytes Absolute 11/30/2017 0.5  0.1 - 0.9 K/uL Final  . Eosinophils Relative 11/30/2017 1  % Final  . Eosinophils Absolute 11/30/2017 0.0  0.0 - 0.5 K/uL Final  . Basophils Relative 11/30/2017 1  % Final  . Basophils Absolute 11/30/2017 0.0  0.0 - 0.1 K/uL Final   Performed at [facility], [place] [address] [place], [place] [address]  . Sodium 11/30/2017 142  136 - 145 mmol/L Final  . Potassium 11/30/2017 3.4* 3.5 - 5.1 mmol/L Final  . Chloride 11/30/2017 107  98 - 109 mmol/L Final  . CO2 11/30/2017 26  22 - 29 mmol/L Final  . Glucose, Bld 11/30/2017 117  70 - 140 mg/dL Final  . BUN 11/30/2017 7  7 - 26 mg/dL Final   . Creatinine 11/30/2017 0.72  0.60 - 1.10 mg/dL Final  . Calcium 11/30/2017 9.1  8.4 - 10.4 mg/dL Final  . Total Protein 11/30/2017 7.4  6.4 - 8.3 g/dL Final  . Albumin 11/30/2017 3.0* 3.5 - 5.0 g/dL Final  . AST 11/30/2017 47* 5 - 34 U/L Final  . ALT 11/30/2017 24  0 - 55 U/L Final  . Alkaline Phosphatase 11/30/2017 118  40 - 150 U/L Final  . Total Bilirubin 11/30/2017 0.3  0.2 - 1.2 mg/dL Final  . GFR, Est Non Af Am 11/30/2017 >60  >60 mL/min Final  . GFR, Est AFR Am 11/30/2017 >60  >60 mL/min Final   Comment: (NOTE) The eGFR has been calculated using the CKD EPI equation. This calculation has not been validated in all clinical situations. eGFR's persistently <60 mL/min signify possible Chronic Kidney Disease.   Georgiann Hahn gap 11/30/2017 9  3 - 11 Final   Performed at [facility], [address] [place], [place] [address]    (this displays the last labs from the last 3 days)  No results found for: TOTALPROTELP, ALBUMINELP, A1GS, A2GS, BETS, BETA2SER, GAMS, MSPIKE, SPEI (this displays SPEP labs)  No results found for: KPAFRELGTCHN, LAMBDASER, KAPLAMBRATIO (kappa/lambda light chains)  No results found for: HGBA, HGBA2QUANT, HGBFQUANT, HGBSQUAN (Hemoglobinopathy evaluation)   No results found for: LDH  No results found for: IRON, TIBC, IRONPCTSAT (Iron and TIBC)  No results found for: FERRITIN  Urinalysis    Component Value Date/Time   COLORURINE YELLOW 08/10/2017 0924   APPEARANCEUR CLEAR 08/10/2017 0924   LABSPEC 1.010 08/10/2017 0924   PHURINE 6.5 08/10/2017 Honor 08/10/2017 0924   HGBUR NEGATIVE 08/10/2017 0924   BILIRUBINUR NEGATIVE 08/10/2017 0924   KETONESUR NEGATIVE 08/10/2017 0924   PROTEINUR NEGATIVE 08/10/2017 0924   NITRITE NEGATIVE 08/10/2017 0924   LEUKOCYTESUR SMALL (A) 08/10/2017 0924     STUDIES: Repeat echocardiogram scheduled for 09/23/2017  ELIGIBLE FOR AVAILABLE RESEARCH  PROTOCOL:no  ASSESSMENT: 72 y.o. [place] woman (primarily residing in [place])  (1) status post left lumpectomy in 2012 for a reported pT2 pN1 breast cancer, the patient refusing adjuvant treatment (no chemotherapy, radiation, or antiestrogens).  METASTATIC DISEASE: December 2018 (bilateral triple positive disease) (2) status post left breast biopsy 03/30/2017 for a clinical T4 N0, stage IIIB invasive ductal carcinoma, grade 3, triple positive, with an MIB-1 of 70%  (a) staging studies 05/21/2017 show multiple bilateral pulmonary nodules consistent with stage IV disease 05/21/2017; there are no liver or bone lesions noted  (b) breast MRI 05/13/2017 shows a 7 cm area of non-masslike enhancement in the right breast, with biopsy (  [id number]) on 06/24/2017 showing: Metastatic carcinoma. Prognostic panel was strongly ER and PR positive as well as HER-2 positive with the signals ratio of 2.61 and number per cell 5.35 (triple positive)  (3) neoadjuvant chemotherapy with T-DM1 started 06/29/2016  (a) echocardiogram 06/24/2017 showed an ejection fraction of 60-65 %  (B) echocardiogram 09/23/2017 showed an ejection fraction of 65-70%  (c) CT chest on 10/12/2017 shows progression in nodes, pulmonary nodules, and bone mets  (4) definitive surgery to follow once left chest wall disease under better control  (5) anastrozole written on 09/21/2017, never started by patient  (a) patient states was unable to tolerate tamoxifen in the past (nausea).  (6) genetics testing pending  (7) chemotherapy with carboplatin, gemcitabine, and trastuzumab to start 11/01/2017, with Gemcitabine and Carboplatin given on day 1, and Gemcitabine, Carboplatin, and Trastuzumab given on day 8 to be repeated every 21 days x 6, or to optimal response  PLAN:  Tracy Howell is doing well today.  She will proceed with chemotherapy today with Gemcitabine/Carboplatin, Trastuzumab.  Her ANC is 1.3.  Due to her mild neutropenia, she will receive  Onpro today to prevent chemotherapy induced neutropenia that can delay treatment, and to prevent febrile neutropenia.  I reviewed risks and benefits with Tracy Howell and her husband.  They are agreeable.    Tracy Howell will return in 2 weeks for labs, f/u and cycle 3 of her treatment with Gemcitabine and Carboplatin.  Tracy Howell knows to call for any questions or concerns prior to her next appointment with [place].    A total of (30) minutes of face-to-face time was spent with this patient with greater than 50% of that time in counseling and care-coordination.  Wilber Bihari, NP   11/30/17 2:29 PM Medical Oncology and Hematology [facility] [address] [place], [place] [address] Tel. [phone]    Fax. [phone]

## 2017-11-30 NOTE — Patient Instructions (Signed)
Pegfilgrastim injection What is this medicine? PEGFILGRASTIM (PEG fil gra stim) is a long-acting granulocyte colony-stimulating factor that stimulates the growth of neutrophils, a type of white blood cell important in the body's fight against infection. It is used to reduce the incidence of fever and infection in patients with certain types of cancer who are receiving chemotherapy that affects the bone marrow, and to increase survival after being exposed to high doses of radiation. This medicine may be used for other purposes; ask your health care provider or pharmacist if you have questions. COMMON BRAND NAME(S): Neulasta What should I tell my health care provider before I take this medicine? They need to know if you have any of these conditions: -kidney disease -latex allergy -ongoing radiation therapy -sickle cell disease -skin reactions to acrylic adhesives (On-Body Injector only) -an unusual or allergic reaction to pegfilgrastim, filgrastim, other medicines, foods, dyes, or preservatives -pregnant or trying to get pregnant -breast-feeding How should I use this medicine? This medicine is for injection under the skin. If you get this medicine at home, you will be taught how to prepare and give the pre-filled syringe or how to use the On-body Injector. Refer to the patient Instructions for Use for detailed instructions. Use exactly as directed. Tell your healthcare provider immediately if you suspect that the On-body Injector may not have performed as intended or if you suspect the use of the On-body Injector resulted in a missed or partial dose. It is important that you put your used needles and syringes in a special sharps container. Do not put them in a trash can. If you do not have a sharps container, call your pharmacist or healthcare provider to get one. Talk to your pediatrician regarding the use of this medicine in children. While this drug may be prescribed for selected conditions,  precautions do apply. Overdosage: If you think you have taken too much of this medicine contact a poison control center or emergency room at once. NOTE: This medicine is only for you. Do not share this medicine with others. What if I miss a dose? It is important not to miss your dose. Call your doctor or health care professional if you miss your dose. If you miss a dose due to an On-body Injector failure or leakage, a new dose should be administered as soon as possible using a single prefilled syringe for manual use. What may interact with this medicine? Interactions have not been studied. Give your health care provider a list of all the medicines, herbs, non-prescription drugs, or dietary supplements you use. Also tell them if you smoke, drink alcohol, or use illegal drugs. Some items may interact with your medicine. This list may not describe all possible interactions. Give your health care provider a list of all the medicines, herbs, non-prescription drugs, or dietary supplements you use. Also tell them if you smoke, drink alcohol, or use illegal drugs. Some items may interact with your medicine. What should I watch for while using this medicine? You may need blood work done while you are taking this medicine. If you are going to need a MRI, CT scan, or other procedure, tell your doctor that you are using this medicine (On-Body Injector only). What side effects may I notice from receiving this medicine? Side effects that you should report to your doctor or health care professional as soon as possible: -allergic reactions like skin rash, itching or hives, swelling of the face, lips, or tongue -dizziness -fever -pain, redness, or irritation at site   where injected -pinpoint red spots on the skin -red or dark-brown urine -shortness of breath or breathing problems -stomach or side pain, or pain at the shoulder -swelling -tiredness -trouble passing urine or change in the amount of urine Side  effects that usually do not require medical attention (report to your doctor or health care professional if they continue or are bothersome): -bone pain -muscle pain This list may not describe all possible side effects. Call your doctor for medical advice about side effects. You may report side effects to FDA at 1-800-FDA-1088. Where should I keep my medicine? Keep out of the reach of children. Store pre-filled syringes in a refrigerator between 2 and 8 degrees C (36 and 46 degrees F). Do not freeze. Keep in carton to protect from light. Throw away this medicine if it is left out of the refrigerator for more than 48 hours. Throw away any unused medicine after the expiration date. NOTE: This sheet is a summary. It may not cover all possible information. If you have questions about this medicine, talk to your doctor, pharmacist, or health care provider.  2018 Elsevier/Gold Standard (2016-05-28 12:58:03)  

## 2017-12-01 ENCOUNTER — Telehealth: Payer: Self-pay | Admitting: Adult Health

## 2017-12-01 NOTE — Telephone Encounter (Signed)
Per 6/18 no los

## 2017-12-14 ENCOUNTER — Inpatient Hospital Stay: Payer: Medicare Other

## 2017-12-14 ENCOUNTER — Encounter: Payer: Self-pay | Admitting: Adult Health

## 2017-12-14 ENCOUNTER — Inpatient Hospital Stay: Payer: Medicare Other | Attending: Oncology

## 2017-12-14 ENCOUNTER — Inpatient Hospital Stay (HOSPITAL_BASED_OUTPATIENT_CLINIC_OR_DEPARTMENT_OTHER): Payer: Medicare Other | Admitting: Adult Health

## 2017-12-14 VITALS — BP 143/61 | HR 92 | Temp 98.6°F | Resp 18 | Ht 66.5 in | Wt 222.4 lb

## 2017-12-14 DIAGNOSIS — Z79899 Other long term (current) drug therapy: Secondary | ICD-10-CM | POA: Insufficient documentation

## 2017-12-14 DIAGNOSIS — C7802 Secondary malignant neoplasm of left lung: Secondary | ICD-10-CM | POA: Diagnosis not present

## 2017-12-14 DIAGNOSIS — C7801 Secondary malignant neoplasm of right lung: Secondary | ICD-10-CM | POA: Insufficient documentation

## 2017-12-14 DIAGNOSIS — C50212 Malignant neoplasm of upper-inner quadrant of left female breast: Secondary | ICD-10-CM

## 2017-12-14 DIAGNOSIS — Z170 Estrogen receptor positive status [ER+]: Principal | ICD-10-CM

## 2017-12-14 DIAGNOSIS — Z5112 Encounter for antineoplastic immunotherapy: Secondary | ICD-10-CM | POA: Insufficient documentation

## 2017-12-14 DIAGNOSIS — C7951 Secondary malignant neoplasm of bone: Secondary | ICD-10-CM | POA: Insufficient documentation

## 2017-12-14 DIAGNOSIS — Z853 Personal history of malignant neoplasm of breast: Secondary | ICD-10-CM | POA: Insufficient documentation

## 2017-12-14 DIAGNOSIS — Z95828 Presence of other vascular implants and grafts: Secondary | ICD-10-CM

## 2017-12-14 DIAGNOSIS — Z803 Family history of malignant neoplasm of breast: Secondary | ICD-10-CM

## 2017-12-14 DIAGNOSIS — Z5111 Encounter for antineoplastic chemotherapy: Secondary | ICD-10-CM | POA: Diagnosis not present

## 2017-12-14 DIAGNOSIS — G620 Drug-induced polyneuropathy: Secondary | ICD-10-CM | POA: Diagnosis not present

## 2017-12-14 LAB — CBC WITH DIFFERENTIAL (CANCER CENTER ONLY)
Basophils Absolute: 0.1 10*3/uL (ref 0.0–0.1)
Basophils Relative: 1 %
EOS ABS: 0.2 10*3/uL (ref 0.0–0.5)
Eosinophils Relative: 2 %
HCT: 30.4 % — ABNORMAL LOW (ref 34.8–46.6)
HEMOGLOBIN: 9.8 g/dL — AB (ref 11.6–15.9)
LYMPHS ABS: 2.6 10*3/uL (ref 0.9–3.3)
LYMPHS PCT: 26 %
MCH: 30 pg (ref 25.1–34.0)
MCHC: 32.2 g/dL (ref 31.5–36.0)
MCV: 93 fL (ref 79.5–101.0)
MONOS PCT: 7 %
Monocytes Absolute: 0.7 10*3/uL (ref 0.1–0.9)
Neutro Abs: 6.5 10*3/uL (ref 1.5–6.5)
Neutrophils Relative %: 64 %
Platelet Count: 171 10*3/uL (ref 145–400)
RBC: 3.27 MIL/uL — AB (ref 3.70–5.45)
RDW: 20.3 % — ABNORMAL HIGH (ref 11.2–14.5)
WBC Count: 10 10*3/uL (ref 3.9–10.3)

## 2017-12-14 LAB — CMP (CANCER CENTER ONLY)
ALBUMIN: 3.1 g/dL — AB (ref 3.5–5.0)
ALK PHOS: 156 U/L — AB (ref 38–126)
ALT: 19 U/L (ref 0–44)
ANION GAP: 6 (ref 5–15)
AST: 32 U/L (ref 15–41)
BILIRUBIN TOTAL: 0.5 mg/dL (ref 0.3–1.2)
BUN: 5 mg/dL — ABNORMAL LOW (ref 8–23)
CALCIUM: 9.3 mg/dL (ref 8.9–10.3)
CO2: 29 mmol/L (ref 22–32)
Chloride: 106 mmol/L (ref 98–111)
Creatinine: 0.72 mg/dL (ref 0.44–1.00)
GLUCOSE: 122 mg/dL — AB (ref 70–99)
Potassium: 3.6 mmol/L (ref 3.5–5.1)
Sodium: 141 mmol/L (ref 135–145)
TOTAL PROTEIN: 7.4 g/dL (ref 6.5–8.1)

## 2017-12-14 MED ORDER — SODIUM CHLORIDE 0.9% FLUSH
10.0000 mL | Freq: Once | INTRAVENOUS | Status: AC
  Administered 2017-12-14: 10 mL
  Filled 2017-12-14: qty 10

## 2017-12-14 MED ORDER — PALONOSETRON HCL INJECTION 0.25 MG/5ML
0.2500 mg | Freq: Once | INTRAVENOUS | Status: AC
  Administered 2017-12-14: 0.25 mg via INTRAVENOUS

## 2017-12-14 MED ORDER — SODIUM CHLORIDE 0.9 % IV SOLN
214.6000 mg | Freq: Once | INTRAVENOUS | Status: AC
  Administered 2017-12-14: 210 mg via INTRAVENOUS
  Filled 2017-12-14: qty 21

## 2017-12-14 MED ORDER — DEXAMETHASONE SODIUM PHOSPHATE 10 MG/ML IJ SOLN
10.0000 mg | Freq: Once | INTRAMUSCULAR | Status: AC
  Administered 2017-12-14: 10 mg via INTRAVENOUS

## 2017-12-14 MED ORDER — SODIUM CHLORIDE 0.9% FLUSH
10.0000 mL | INTRAVENOUS | Status: DC | PRN
  Administered 2017-12-14: 10 mL
  Filled 2017-12-14: qty 10

## 2017-12-14 MED ORDER — SODIUM CHLORIDE 0.9 % IV SOLN
Freq: Once | INTRAVENOUS | Status: AC
  Administered 2017-12-14: 10:00:00 via INTRAVENOUS

## 2017-12-14 MED ORDER — PALONOSETRON HCL INJECTION 0.25 MG/5ML
INTRAVENOUS | Status: AC
  Filled 2017-12-14: qty 5

## 2017-12-14 MED ORDER — DEXAMETHASONE SODIUM PHOSPHATE 10 MG/ML IJ SOLN
INTRAMUSCULAR | Status: AC
  Filled 2017-12-14: qty 1

## 2017-12-14 MED ORDER — SODIUM CHLORIDE 0.9 % IV SOLN: 800.0000 mg/m2 | Freq: Once | INTRAVENOUS | Status: DC

## 2017-12-14 MED ORDER — SODIUM CHLORIDE 0.9 % IV SOLN
800.0000 mg/m2 | Freq: Once | INTRAVENOUS | Status: AC
  Administered 2017-12-14: 1786 mg via INTRAVENOUS
  Filled 2017-12-14: qty 46.97

## 2017-12-14 MED ORDER — HEPARIN SOD (PORK) LOCK FLUSH 100 UNIT/ML IV SOLN
500.0000 [IU] | Freq: Once | INTRAVENOUS | Status: AC | PRN
  Administered 2017-12-14: 500 [IU]
  Filled 2017-12-14: qty 5

## 2017-12-14 NOTE — Progress Notes (Signed)
[place]  Telephone:(336) [phone] Fax:(336) [phone]     ID: Marjarie Irion DOB: 07-Mar-1946  MR#: [id number]  QJJ#:[id number]  Patient Care Team: Lucianne Lei, MD as PCP - General (Family Medicine) Alphonsa Overall, MD as Consulting Physician (General Surgery) Magrinat, Virgie Dad, MD as Consulting Physician (Oncology) Eppie Gibson, MD as Attending Physician (Radiation Oncology) Juanita Craver, MD as Consulting Physician (Gastroenterology) OTHER MD:  CHIEF COMPLAINT: triple positive bilateral breast cancer  CURRENT TREATMENT: Gemcitabine, Carboplatin, Trastuzumab   HISTORY OF CURRENT ILLNESS: From the original intake note:  "Tracy Howell" tells me she underwent left lumpectomy in [place] on 2012 for a 2.5 cm, grade 2 breast cancer involving one lymph node of 5 sampled. [ It may have been only 2 lymph nodes that were removed she says.]  This was at the [facility] and [facility], currently the [place] [facility] on [address].  The patient says she was told could have more treatment if she wanted it.  She did not see any sense in it.  She feels her left breast cancer was caused by the fact that she kept her iPhone in her bra right by the left breast.  Sometime in April 2018 she noted a new mass in the left breast. She did not immediately bring it to medical attention but as it continued to grow she mentioned her to her primary care physician and on 03/29/2017 Tracy Howell underwent bilateral diagnostic mammography with tomography and left breast ultrasonography at [facility].  The breast density was category B.  In the left breast central to the nipple there was a 6 cm irregular mass with indistinct margins and heterogeneous calcifications.  By ultrasound this measured 5 cm, with indistinct margins, at the 12:00 anterior area.  There was associated edema.  The left axilla was sonographically benign.  Biopsy of the left breast area in question March 30, 2017 showed ([id number]) invasive ductal carcinoma, grade 3, estrogen receptor 100% positive, progesterone receptor 60% positive, both with strong staining intensity, HER-2 amplified, with a signals ratio of 3.38, and the number per cell 7.95.  The  MIB-1 was 70%.  The patient's subsequent history is as detailed below.  INTERVAL HISTORY: Tracy Howell returns today for further evaluation and treatment of her triple positive breast cancer accompanied by her husband.  She is here today to continue with neoadjuvant treatment with Gemcitabine, Carboplatin, Trastuzumab given on days 1 and 8 of a 21 day cycle.  She receives the Gemcitabine and Carboplatin on day 1 and the Trastuzumab and Gemcitabine and carboplatin on day 8.  Today is cycle 3 day 1.    REVIEW OF SYSTEMS: Tracy Howell is doing well today.  She had some soreness after receiving the Neulasta.  She notes that claritin gives her headache so she didn't take it.  Otherwise, she has been feeling well.  Her breast continues to improve.  She remains active, and is eating and drinking well.  A detailed ROS was conducted and is non contributory today.    PAST MEDICAL HISTORY: Past Medical History:  Diagnosis Date  . Angioedema   . Breast cancer ([place]) 2013  . Complication of anesthesia    slow to wake up   . Pneumonia    hx of x 2   . recurrent left breast ca dx'd 04/2017  . SBO (small bowel obstruction) ([place]) 08/13/2014    PAST SURGICAL HISTORY: Past Surgical History:  Procedure Laterality Date  . ABDOMINAL HYSTERECTOMY    .  APPENDECTOMY    . BREAST LUMPECTOMY  2013  . BREAST SURGERY    . CESAREAN SECTION     x2  . CHOLECYSTECTOMY    . PORTACATH PLACEMENT Right 05/17/2017   Procedure: INSERTION PORT-A-CATH right subclavian vein;  Surgeon: Alphonsa Overall, MD;  Location: [facility];  Service: General;  Laterality: Right;    FAMILY HISTORY Family History  Problem Relation Age of Onset  . Angioedema Mother   . Breast cancer Mother   .  Breast cancer Maternal Aunt   The patient was adopted.  Her adoptive parents died in their 25s.  The patient's birth mother however died in her 7s from breast cancer.  The patient believes she has 2 brothers is not sure about sisters.  However she does have 2 maternal aunts who had breast cancer.  There is no history of ovarian cancer in the family to her knowledge.  The patient has no information regarding her father or his side of the family.   GYNECOLOGIC HISTORY:  No LMP recorded. Patient has had a hysterectomy. Menarche age 61, first live birth age 49, she is [place] P2.  She underwent hysterectomy at age 53.  She tells me they left a fourth of an ovary at that time.  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  She actually lives in [place], [place], and spends 2 weeks here every 2 months at her job.  When she is in town she actually lives with Dr. Criss Rosales. 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  She recently married Tracy Howell who is a Camera operator.  He has 2 children of his own, in [place] and [place].  The patient's own children are Tracy Howell who lives in [place] and works in Engineer, technical sales, and [employer] lives in [place] and owns a [place].  The patient has no grandchildren.  She is 1/7-day [facility].  (She tells me her religion has no bands on any medical treatments but she cannot have alcohol, cigarettes, or pork).   ADVANCED DIRECTIVES:    HEALTH MAINTENANCE: Social History   Tobacco Use  . Smoking status: Never Smoker  . Smokeless tobacco: Never Used  Substance Use Topics  . Alcohol use: No  . Drug use: No     Colonoscopy:  PAP:  Bone density:   Allergies  Allergen Reactions  . Onion Anaphylaxis, Nausea And Vomiting and Swelling    Throat swelling.  . Other  Anaphylaxis and Nausea And Vomiting    Green Peppers-anaphylactic, nausea/vomiting/swelling  Mushrooms-nausea/vomiting.  Patient has hereditary angioedema (HAE)  . Aleve [Naproxen] Other (See Comments)    bleeding  . Codeine Nausea And Vomiting  . Morphine And Related Nausea And Vomiting  . Penicillins Rash    Vomiting Has patient had a PCN reaction causing immediate rash, facial/tongue/throat swelling, SOB or lightheadedness with hypotension: Yes Has patient had a PCN reaction causing severe rash involving mucus membranes or skin necrosis: Yes Has patient had a PCN reaction that required hospitalization:Was inpatient when reaction occurred. Has patient had a PCN reaction occurring within the last 10 years: No If all of the above answers are "NO", then may proceed with Cephalosporin use.     Current Outpatient Medications  Medication Sig Dispense Refill  . anastrozole (ARIMIDEX) 1 MG tablet Take 1  tablet (1 mg total) by mouth daily. 90 tablet 4  . Ascorbic Acid (VITAMIN C PO) Take 1 tablet by mouth daily.    . B Complex-C (B-COMPLEX WITH VITAMIN C) tablet Take 1 tablet by mouth daily.    . COD LIVER OIL PO Take 1 capsule by mouth daily.    Marland Kitchen dexamethasone (DECADRON) 4 MG tablet Take 2 tablets (8 mg total) by mouth daily. Start the day after chemotherapy for 2 days. Take with food. 30 tablet 1  . lidocaine-prilocaine (EMLA) cream Apply 1 application topically as needed. 30 g 0  . lidocaine-prilocaine (EMLA) cream Apply to affected area once 30 g 3  . LORazepam (ATIVAN) 0.5 MG tablet Take 1 tablet (0.5 mg total) by mouth at bedtime as needed (Nausea or vomiting). 30 tablet 0  . Multiple Vitamin (MULTIVITAMIN WITH MINERALS) TABS tablet Take 1 tablet by mouth daily.    . prochlorperazine (COMPAZINE) 10 MG tablet Take 1 tablet (10 mg total) by mouth every 6 (six) hours as needed (Nausea or vomiting). 30 tablet 1  . traMADol (ULTRAM) 50 MG tablet Take 1 tablet (50 mg total) by mouth every 6  (six) hours as needed. 30 tablet 0  . triamterene-hydrochlorothiazide (DYAZIDE) 37.5-25 MG capsule Take 1 each (1 capsule total) by mouth daily. 30 capsule 0   No current facility-administered medications for this visit.     OBJECTIVE:   Vitals:   12/14/17 0906  BP: (!) 143/61  Pulse: 92  Resp: 18  Temp: 98.6 F (37 C)  SpO2: 98%     Body mass index is 35.36 kg/m.   Wt Readings from Last 3 Encounters:  12/14/17 222 lb 6.4 oz (100.9 kg)  11/30/17 224 lb 11.2 oz (101.9 kg)  11/23/17 224 lb 11.2 oz (101.9 kg)  ECOG FS:1 - Symptomatic but completely ambulatory GENERAL: Patient is a well appearing female in no acute distress HEENT:  Sclerae anicteric.  Oropharynx clear and moist. No ulcerations or evidence of oropharyngeal candidiasis. Neck is supple. NODES:  No cervical, supraclavicular, or axillary lymphadenopathy palpated.  BREAST EXAM:  See picture below, tumor continues to Improve LUNGS:  Clear to auscultation bilaterally.  No wheezes or rhonchi. HEART:  Regular rate and rhythm. No murmur appreciated. ABDOMEN:  Soft, nontender.  Positive, normoactive bowel sounds. No organomegaly palpated. MSK:  No focal spinal tenderness to palpation. Full range of motion bilaterally in the upper extremities. EXTREMITIES:  No peripheral edema.   SKIN:  Clear with no obvious rashes or skin changes. No nail dyscrasia. NEURO:  Nonfocal. Well oriented.  Appropriate affect.  Breast on 11/01/2017   Breast on 11/23/2017   Breast on 12/14/2017       LAB RESULTS:  CMP     Component Value Date/Time   NA 142 11/30/2017 0844   NA 142 05/12/2017 0808   K 3.4 (L) 11/30/2017 0844   K 3.5 05/12/2017 0808   CL 107 11/30/2017 0844   CO2 26 11/30/2017 0844   CO2 26 05/12/2017 0808   GLUCOSE 117 11/30/2017 0844   GLUCOSE 133 05/12/2017 0808   BUN 7 11/30/2017 0844   BUN 9.8 05/12/2017 0808   CREATININE 0.72 11/30/2017 0844   CREATININE 0.9 05/12/2017 0808   CALCIUM 9.1 11/30/2017 0844    CALCIUM 9.3 05/12/2017 0808   PROT 7.4 11/30/2017 0844   PROT 7.3 05/12/2017 0808   ALBUMIN 3.0 (L) 11/30/2017 0844   ALBUMIN 3.3 (L) 05/12/2017 0808   AST 47 (H) 11/30/2017 7416  AST 15 05/12/2017 0808   ALT 24 11/30/2017 0844   ALT 10 05/12/2017 0808   ALKPHOS 118 11/30/2017 0844   ALKPHOS 90 05/12/2017 0808   BILITOT 0.3 11/30/2017 0844   BILITOT 0.56 05/12/2017 0808   GFRNONAA >60 11/30/2017 0844   GFRAA >60 11/30/2017 0844    No results found for: Ronnald Ramp, A1GS, A2GS, BETS, BETA2SER, GAMS, MSPIKE, SPEI  No results found for: Nils Pyle, [facility]  Lab Results  Component Value Date   WBC 10.0 12/14/2017   NEUTROABS 6.5 12/14/2017   HGB 9.8 (L) 12/14/2017   HCT 30.4 (L) 12/14/2017   MCV 93.0 12/14/2017   PLT 171 12/14/2017      Chemistry      Component Value Date/Time   NA 142 11/30/2017 0844   NA 142 05/12/2017 0808   K 3.4 (L) 11/30/2017 0844   K 3.5 05/12/2017 0808   CL 107 11/30/2017 0844   CO2 26 11/30/2017 0844   CO2 26 05/12/2017 0808   BUN 7 11/30/2017 0844   BUN 9.8 05/12/2017 0808   CREATININE 0.72 11/30/2017 0844   CREATININE 0.9 05/12/2017 0808      Component Value Date/Time   CALCIUM 9.1 11/30/2017 0844   CALCIUM 9.3 05/12/2017 0808   ALKPHOS 118 11/30/2017 0844   ALKPHOS 90 05/12/2017 0808   AST 47 (H) 11/30/2017 0844   AST 15 05/12/2017 0808   ALT 24 11/30/2017 0844   ALT 10 05/12/2017 0808   BILITOT 0.3 11/30/2017 0844   BILITOT 0.56 05/12/2017 0808       No results found for: LABCA2  No components found for: TDDUKG254  No results for input(s): INR in the last 168 hours.  No results found for: LABCA2  No results found for: CAN199  No results found for: YHC623  No results found for: JSE831  Lab Results  Component Value Date   CA2729 27.6 11/23/2017    No components found for: HGQUANT  No results found for: CEA1 / No results found for: CEA1   No results found for: AFPTUMOR  No  results found for: CHROMOGRNA  No results found for: PSA1  Appointment on 12/14/2017  Component Date Value Ref Range Status  . WBC Count 12/14/2017 10.0  3.9 - 10.3 K/uL Final  . RBC 12/14/2017 3.27* 3.70 - 5.45 MIL/uL Final  . Hemoglobin 12/14/2017 9.8* 11.6 - 15.9 g/dL Final  . HCT 12/14/2017 30.4* 34.8 - 46.6 % Final  . MCV 12/14/2017 93.0  79.5 - 101.0 fL Final  . MCH 12/14/2017 30.0  25.1 - 34.0 pg Final  . MCHC 12/14/2017 32.2  31.5 - 36.0 g/dL Final  . RDW 12/14/2017 20.3* 11.2 - 14.5 % Final  . Platelet Count 12/14/2017 171  145 - 400 K/uL Final  . Neutrophils Relative % 12/14/2017 64  % Final  . Neutro Abs 12/14/2017 6.5  1.5 - 6.5 K/uL Final  . Lymphocytes Relative 12/14/2017 26  % Final  . Lymphs Abs 12/14/2017 2.6  0.9 - 3.3 K/uL Final  . Monocytes Relative 12/14/2017 7  % Final  . Monocytes Absolute 12/14/2017 0.7  0.1 - 0.9 K/uL Final  . Eosinophils Relative 12/14/2017 2  % Final  . Eosinophils Absolute 12/14/2017 0.2  0.0 - 0.5 K/uL Final  . Basophils Relative 12/14/2017 1  % Final  . Basophils Absolute 12/14/2017 0.1  0.0 - 0.1 K/uL Final   Performed at [facility], [place] [address], [place], [place] [address]    (  this displays the last labs from the last 3 days)  No results found for: TOTALPROTELP, ALBUMINELP, A1GS, A2GS, BETS, BETA2SER, GAMS, MSPIKE, SPEI (this displays SPEP labs)  No results found for: KPAFRELGTCHN, LAMBDASER, KAPLAMBRATIO (kappa/lambda light chains)  No results found for: HGBA, HGBA2QUANT, HGBFQUANT, HGBSQUAN (Hemoglobinopathy evaluation)   No results found for: LDH  No results found for: IRON, TIBC, IRONPCTSAT (Iron and TIBC)  No results found for: FERRITIN  Urinalysis    Component Value Date/Time   COLORURINE YELLOW 08/10/2017 0924   APPEARANCEUR CLEAR 08/10/2017 0924   LABSPEC 1.010 08/10/2017 0924   PHURINE 6.5 08/10/2017 [place] 08/10/2017 0924   HGBUR NEGATIVE 08/10/2017 [place] 08/10/2017 0924   KETONESUR NEGATIVE 08/10/2017 0924   PROTEINUR NEGATIVE 08/10/2017 0924   NITRITE NEGATIVE 08/10/2017 0924   LEUKOCYTESUR SMALL (A) 08/10/2017 0924     STUDIES: Repeat echocardiogram scheduled for 09/23/2017  ELIGIBLE FOR AVAILABLE RESEARCH PROTOCOL:no  ASSESSMENT: 72 y.o. [place] woman (primarily residing in [place])  (1) status post left lumpectomy in 2012 for a reported pT2 pN1 breast cancer, the patient refusing adjuvant treatment (no chemotherapy, radiation, or antiestrogens).  METASTATIC DISEASE: December 2018 (bilateral triple positive disease) (2) status post left breast biopsy 03/30/2017 for a clinical T4 N0, stage IIIB invasive ductal carcinoma, grade 3, triple positive, with an MIB-1 of 70%  (a) staging studies 05/21/2017 show multiple bilateral pulmonary nodules consistent with stage IV disease 05/21/2017; there are no liver or bone lesions noted  (b) breast MRI 05/13/2017 shows a 7 cm area of non-masslike enhancement in the right breast, with biopsy ([id number]) on 06/24/2017 showing: Metastatic carcinoma. Prognostic panel was strongly ER and PR positive as well as HER-2 positive with the signals ratio of 2.61 and number per cell 5.35 (triple positive)  (3) neoadjuvant chemotherapy with T-DM1 started 06/29/2016  (a) echocardiogram 06/24/2017 showed an ejection fraction of 60-65 %  (B) echocardiogram 09/23/2017 showed an ejection fraction of 65-70%  (c) CT chest on 10/12/2017 shows progression in nodes, pulmonary nodules, and bone mets  (4) definitive surgery to follow once left chest wall disease under better control  (5) anastrozole written on 09/21/2017, never started by patient  (a) patient states was unable to tolerate tamoxifen in the past (nausea).  (6) genetics testing pending  (7) chemotherapy with carboplatin, gemcitabine, and trastuzumab to start 11/01/2017, with Gemcitabine and Carboplatin given on day 1, and Gemcitabine,  Carboplatin, and Trastuzumab given on day 8 to be repeated every 21 days x 6, or to optimal response  PLAN:  Tracy Howell is doing well today.  She continues to look very good while undergoing this chemotherapy.  Her breast continues to improve.  First and foremost, she will proceed with treatment today with Gemcitabine and Carboplatin.  Her labs remain stable.    Second, I reviewed the picture of her breast with Dr. Jana Hakim.  It continues to improve which is promising.  We plan to continue treatment a hopefully 3 more cycles since it is responding to hopefully get good margins with her surgery.    I sent Dr. Lucia Gaskins a message since he wanted to see Tracy Howell around this time into her treatment.  We will see when he can get her in.    Tracy Howell will return in 1 weeks for labs, f/u and cycle 3 day 8 of her treatment with Gemcitabine and Carboplatin and Trastuzumab.  Tracy Howell knows to call for any questions or concerns prior to her next appointment with  [place].    A total of (30) minutes of face-to-face time was spent with this patient with greater than 50% of that time in counseling and care-coordination.  Wilber Bihari, NP 12/14/17 9:12 AM Medical Oncology and Hematology [facility] [address] [place], [place] [address] Tel. [phone]    Fax. [phone]

## 2017-12-14 NOTE — Progress Notes (Signed)
Left breast [id number]

## 2017-12-14 NOTE — Patient Instructions (Signed)
[facility] Discharge Instructions for Patients Receiving Chemotherapy  Today you received the following chemotherapy agents Gemzar and Carboplatin   To help prevent nausea and vomiting after your treatment, we encourage you to take your nausea medication as directed.    If you develop nausea and vomiting that is not controlled by your nausea medication, call the clinic.   BELOW ARE SYMPTOMS THAT SHOULD BE REPORTED IMMEDIATELY:  *FEVER GREATER THAN 100.5 F  *CHILLS WITH OR WITHOUT FEVER  NAUSEA AND VOMITING THAT IS NOT CONTROLLED WITH YOUR NAUSEA MEDICATION  *UNUSUAL SHORTNESS OF BREATH  *UNUSUAL BRUISING OR BLEEDING  TENDERNESS IN MOUTH AND THROAT WITH OR WITHOUT PRESENCE OF ULCERS  *URINARY PROBLEMS  *BOWEL PROBLEMS  UNUSUAL RASH Items with * indicate a potential emergency and should be followed up as soon as possible.  Feel free to call the clinic should you have any questions or concerns. The clinic phone number is [phone].  Please show the CHEMO ALERT CARD at check-in to the Emergency Department and triage nurse.   

## 2017-12-15 LAB — CANCER ANTIGEN 27.29: CA 27.29: 23.5 U/mL (ref 0.0–38.6)

## 2017-12-20 NOTE — Progress Notes (Signed)
[place]  [phone] [phone]     ID: Tracy Howell DOB: 1945-10-23  MR#: [id number]  NUU#:[id number]  Patient Care Team: Lucianne Lei, MD as PCP - General (Family Medicine) Alphonsa Overall, MD as Consulting Physician (General Surgery) Magrinat, Virgie Dad, MD as Consulting Physician (Oncology) Eppie Gibson, MD as Attending Physician (Radiation Oncology) Juanita Craver, MD as Consulting Physician (Gastroenterology) OTHER MD:  CHIEF COMPLAINT: triple positive bilateral breast cancer  CURRENT TREATMENT: Gemcitabine, Carboplatin, Trastuzumab   HISTORY OF CURRENT ILLNESS: From the original intake note:  "Tracy Howell" tells me she underwent left lumpectomy in [place] on 2012 for a 2.5 cm, grade 2 breast cancer involving one lymph node of 5 sampled. [ It may have been only 2 lymph nodes that were removed she says.]  This was at the [facility] and [facility], currently the [place] [facility] on [address].  The patient says she was told could have more treatment if she wanted it.  She did not see any sense in it.  She feels her left breast cancer was caused by the fact that she kept her iPhone in her bra right by the left breast.  Sometime in April 2018 she noted a new mass in the left breast. She did not immediately bring it to medical attention but as it continued to grow she mentioned her to her primary care physician and on 03/29/2017 Tracy Howell underwent bilateral diagnostic mammography with tomography and left breast ultrasonography at [facility].  The breast density was category B.  In the left breast central to the nipple there was a 6 cm irregular mass with indistinct margins and heterogeneous calcifications.  By ultrasound this measured 5 cm, with indistinct margins, at the 12:00 anterior area.  There was associated edema.  The left axilla was sonographically benign.  Biopsy of the left breast area in question March 30, 2017 showed ([id number]) invasive ductal carcinoma, grade 3, estrogen receptor 100% positive, progesterone receptor 60% positive, both with strong staining intensity, HER-2 amplified, with a signals ratio of 3.38, and the number per cell 7.95.  The  MIB-1 was 70%.  The patient's subsequent history is as detailed below.  INTERVAL HISTORY: Tracy Howell returns today for follow-up and treatment of her triple positive bilateral breast cancer. She is accompanied by her husband.  She receives Gemcitabine, Carboplatin, and Trastuzumab given on days 1 and 8 of a 21 day cycle. She receives the Gemcitabine and Carboplatin on day 1 and the Trastuzumab and Gemcitabine and carboplatin on day 8. Today is day 8 of cycle 3 of 6 planned.   Her echo on 09/23/2017 showed and ejection fraction in the 65 - 70% range.   REVIEW OF SYSTEMS: Tracy Howell is doing well. 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 She also endorses a severe headache after her most recent neulasta shot. She was also sore as well. She was told to take Claritin, but after taking it she got sick and was unable to take it going forward. She is no longer experiencing constipation because she has changed what she eats. She adds gaining 1 pound. Traveling is not bad, the patient and her husband stop along their 10 hour drive. She continues to stay busy and active. The patient denies unusual headaches, visual changes, vomiting, or dizziness. There has been no unusual cough, phlegm production, or pleurisy. This been no change in bowel or bladder habits. The patient denies unexplained fatigue or unexplained weight loss, bleeding,  rash, or fever. A detailed review of systems was otherwise noncontributory.   PAST MEDICAL HISTORY: Past Medical History:  Diagnosis Date  . Angioedema   . Breast cancer ([place]) 2013  . Complication of anesthesia    slow to wake up   . Pneumonia    hx of x 2   . recurrent left breast ca dx'd 04/2017  . SBO  (small bowel obstruction) ([place]) 08/13/2014    PAST SURGICAL HISTORY: Past Surgical History:  Procedure Laterality Date  . ABDOMINAL HYSTERECTOMY    . APPENDECTOMY    . BREAST LUMPECTOMY  2013  . BREAST SURGERY    . CESAREAN SECTION     x2  . CHOLECYSTECTOMY    . PORTACATH PLACEMENT Right 05/17/2017   Procedure: INSERTION PORT-A-CATH right subclavian vein;  Surgeon: Alphonsa Overall, MD;  Location: [facility];  Service: General;  Laterality: Right;    FAMILY HISTORY Family History  Problem Relation Age of Onset  . Angioedema Mother   . Breast cancer Mother   . Breast cancer Maternal Aunt   The patient was adopted.  Her adoptive parents died in their 84s.  The patient's birth mother however died in her 80s from breast cancer.  The patient believes she has 2 brothers is not sure about sisters.  However she does have 2 maternal aunts who had breast cancer.  There is no history of ovarian cancer in the family to her knowledge.  The patient has no information regarding her father or his side of the family.   GYNECOLOGIC HISTORY:  No LMP recorded. Patient has had a hysterectomy. Menarche age 21, first live birth age 14, she is [place] P2.  She underwent hysterectomy at age 26.  She tells me they left a fourth of an ovary at that time.  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  She actually lives in [place], [place], and spends 2 weeks here every 2 months at her job.  When she is in town she actually lives with Dr. Criss Rosales. 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  She recently married [name] who is a Camera operator.  He has 2 children of his own, in [place] and [place].  The patient's own children are Tracy Howell who lives in [place] and works in Engineer, technical sales, and [employer] lives in [place] and  owns a [place].  The patient has no grandchildren.  She is 1/7-day [facility].  (She tells me her religion has no bands on any medical treatments but she cannot have alcohol, cigarettes, or pork).   ADVANCED DIRECTIVES:    HEALTH MAINTENANCE: Social History   Tobacco Use  . Smoking status: Never Smoker  . Smokeless tobacco: Never Used  Substance Use Topics  . Alcohol use: No  . Drug use: No     Colonoscopy:  PAP:  Bone density:   Allergies  Allergen Reactions  . Onion Anaphylaxis, Nausea And Vomiting and Swelling    Throat swelling.  . Other Anaphylaxis and Nausea And Vomiting    Green Peppers-anaphylactic, nausea/vomiting/swelling  Mushrooms-nausea/vomiting.  Patient has hereditary angioedema (HAE)  . Aleve [Naproxen] Other (See Comments)    bleeding  . Codeine Nausea And Vomiting  . Morphine And Related Nausea And Vomiting  . Penicillins Rash    Vomiting Has  patient had a PCN reaction causing immediate rash, facial/tongue/throat swelling, SOB or lightheadedness with hypotension: Yes Has patient had a PCN reaction causing severe rash involving mucus membranes or skin necrosis: Yes Has patient had a PCN reaction that required hospitalization:Was inpatient when reaction occurred. Has patient had a PCN reaction occurring within the last 10 years: No If all of the above answers are "NO", then may proceed with Cephalosporin use.     Current Outpatient Medications  Medication Sig Dispense Refill  . anastrozole (ARIMIDEX) 1 MG tablet Take 1 tablet (1 mg total) by mouth daily. 90 tablet 4  . Ascorbic Acid (VITAMIN C PO) Take 1 tablet by mouth daily.    . B Complex-C (B-COMPLEX WITH VITAMIN C) tablet Take 1 tablet by mouth daily.    . COD LIVER OIL PO Take 1 capsule by mouth daily.    Marland Kitchen dexamethasone (DECADRON) 4 MG tablet Take 2 tablets (8 mg total) by mouth daily. Start the day after chemotherapy for 2 days. Take with food. 30 tablet 1  . lidocaine-prilocaine  (EMLA) cream Apply 1 application topically as needed. 30 g 0  . lidocaine-prilocaine (EMLA) cream Apply to affected area once 30 g 3  . LORazepam (ATIVAN) 0.5 MG tablet Take 1 tablet (0.5 mg total) by mouth at bedtime as needed (Nausea or vomiting). 30 tablet 0  . Multiple Vitamin (MULTIVITAMIN WITH MINERALS) TABS tablet Take 1 tablet by mouth daily.    . prochlorperazine (COMPAZINE) 10 MG tablet Take 1 tablet (10 mg total) by mouth every 6 (six) hours as needed (Nausea or vomiting). 30 tablet 1  . traMADol (ULTRAM) 50 MG tablet Take 1 tablet (50 mg total) by mouth every 6 (six) hours as needed. 30 tablet 0  . triamterene-hydrochlorothiazide (DYAZIDE) 37.5-25 MG capsule Take 1 each (1 capsule total) by mouth daily. 30 capsule 0   No current facility-administered medications for this visit.     OBJECTIVE: Middle-aged African-American woman in no acute distress  Vitals:   12/21/17 0958  BP: (!) 147/57  Pulse: 98  Resp: 18  Temp: 98.2 F (36.8 C)  SpO2: 99%     Body mass index is 35.85 kg/m.   Wt Readings from Last 3 Encounters:  12/21/17 225 lb 8 oz (102.3 kg)  12/14/17 222 lb 6.4 oz (100.9 kg)  11/30/17 224 lb 11.2 oz (101.9 kg)  ECOG FS:1 - Symptomatic but completely ambulatory   Sclerae unicteric, EOMs intact Oropharynx clear and moist No cervical or supraclavicular adenopathy Lungs no rales or rhonchi Heart regular rate and rhythm Abd soft, nontender, positive bowel sounds MSK no focal spinal tenderness, no upper extremity lymphedema Neuro: nonfocal, well oriented, appropriate affect Breasts: The right breast is unremarkable.  The left breast is imaged below   Breast on 11/01/2017   Breast on 12/21/2017           LAB RESULTS:  CMP     Component Value Date/Time   NA 141 12/14/2017 0819   NA 142 05/12/2017 0808   K 3.6 12/14/2017 0819   K 3.5 05/12/2017 0808   CL 106 12/14/2017 0819   CO2 29 12/14/2017 0819   CO2 26 05/12/2017 0808   GLUCOSE 122 (H)  12/14/2017 0819   GLUCOSE 133 05/12/2017 0808   BUN 5 (L) 12/14/2017 0819   BUN 9.8 05/12/2017 0808   CREATININE 0.72 12/14/2017 0819   CREATININE 0.9 05/12/2017 0808   CALCIUM 9.3 12/14/2017 0819   CALCIUM 9.3 05/12/2017 3710  PROT 7.4 12/14/2017 0819   PROT 7.3 05/12/2017 0808   ALBUMIN 3.1 (L) 12/14/2017 0819   ALBUMIN 3.3 (L) 05/12/2017 0808   AST 32 12/14/2017 0819   AST 15 05/12/2017 0808   ALT 19 12/14/2017 0819   ALT 10 05/12/2017 0808   ALKPHOS 156 (H) 12/14/2017 0819   ALKPHOS 90 05/12/2017 0808   BILITOT 0.5 12/14/2017 0819   BILITOT 0.56 05/12/2017 0808   GFRNONAA >60 12/14/2017 0819   GFRAA >60 12/14/2017 0819    No results found for: Ronnald Ramp, A1GS, A2GS, BETS, BETA2SER, GAMS, MSPIKE, SPEI  No results found for: Nils Pyle, [facility]  Lab Results  Component Value Date   WBC 4.3 12/21/2017   NEUTROABS 2.1 12/21/2017   HGB 9.2 (L) 12/21/2017   HCT 28.5 (L) 12/21/2017   MCV 92.8 12/21/2017   PLT 238 12/21/2017      Chemistry      Component Value Date/Time   NA 141 12/14/2017 0819   NA 142 05/12/2017 0808   K 3.6 12/14/2017 0819   K 3.5 05/12/2017 0808   CL 106 12/14/2017 0819   CO2 29 12/14/2017 0819   CO2 26 05/12/2017 0808   BUN 5 (L) 12/14/2017 0819   BUN 9.8 05/12/2017 0808   CREATININE 0.72 12/14/2017 0819   CREATININE 0.9 05/12/2017 0808      Component Value Date/Time   CALCIUM 9.3 12/14/2017 0819   CALCIUM 9.3 05/12/2017 0808   ALKPHOS 156 (H) 12/14/2017 0819   ALKPHOS 90 05/12/2017 0808   AST 32 12/14/2017 0819   AST 15 05/12/2017 0808   ALT 19 12/14/2017 0819   ALT 10 05/12/2017 0808   BILITOT 0.5 12/14/2017 0819   BILITOT 0.56 05/12/2017 0808       No results found for: LABCA2  No components found for: THYHOO875  No results for input(s): INR in the last 168 hours.  No results found for: LABCA2  No results found for: CAN199  No results found for: ZVJ282  No results found for:  SUO156  Lab Results  Component Value Date   CA2729 23.5 12/14/2017    No components found for: HGQUANT  No results found for: CEA1 / No results found for: CEA1   No results found for: AFPTUMOR  No results found for: CHROMOGRNA  No results found for: PSA1  Appointment on 12/21/2017  Component Date Value Ref Range Status  . WBC Count 12/21/2017 4.3  3.9 - 10.3 K/uL Final  . RBC 12/21/2017 3.07* 3.70 - 5.45 MIL/uL Final  . Hemoglobin 12/21/2017 9.2* 11.6 - 15.9 g/dL Final  . HCT 12/21/2017 28.5* 34.8 - 46.6 % Final  . MCV 12/21/2017 92.8  79.5 - 101.0 fL Final  . MCH 12/21/2017 30.0  25.1 - 34.0 pg Final  . MCHC 12/21/2017 32.3  31.5 - 36.0 g/dL Final  . RDW 12/21/2017 19.2* 11.2 - 14.5 % Final  . Platelet Count 12/21/2017 238  145 - 400 K/uL Final  . Neutrophils Relative % 12/21/2017 49  % Final  . Neutro Abs 12/21/2017 2.1  1.5 - 6.5 K/uL Final  . Lymphocytes Relative 12/21/2017 43  % Final  . Lymphs Abs 12/21/2017 1.9  0.9 - 3.3 K/uL Final  . Monocytes Relative 12/21/2017 5  % Final  . Monocytes Absolute 12/21/2017 0.2  0.1 - 0.9 K/uL Final  . Eosinophils Relative 12/21/2017 2  % Final  . Eosinophils Absolute 12/21/2017 0.1  0.0 - 0.5 K/uL Final  . Basophils Relative 12/21/2017  1  % Final  . Basophils Absolute 12/21/2017 0.0  0.0 - 0.1 K/uL Final   Performed at [facility], [address] [place], [place] [address]    (this displays the last labs from the last 3 days)  No results found for: TOTALPROTELP, ALBUMINELP, A1GS, A2GS, BETS, BETA2SER, GAMS, MSPIKE, SPEI (this displays SPEP labs)  No results found for: KPAFRELGTCHN, LAMBDASER, KAPLAMBRATIO (kappa/lambda light chains)  No results found for: HGBA, HGBA2QUANT, HGBFQUANT, HGBSQUAN (Hemoglobinopathy evaluation)   No results found for: LDH  No results found for: IRON, TIBC, IRONPCTSAT (Iron and TIBC)  No results found for: FERRITIN  Urinalysis    Component Value Date/Time    COLORURINE YELLOW 08/10/2017 [place] 08/10/2017 0924   LABSPEC 1.010 08/10/2017 0924   PHURINE 6.5 08/10/2017 [place] 08/10/2017 0924   HGBUR NEGATIVE 08/10/2017 0924   BILIRUBINUR NEGATIVE 08/10/2017 0924   KETONESUR NEGATIVE 08/10/2017 0924   PROTEINUR NEGATIVE 08/10/2017 0924   NITRITE NEGATIVE 08/10/2017 0924   LEUKOCYTESUR SMALL (A) 08/10/2017 0924     STUDIES: Her echo on 09/23/2017 showed and ejection fraction in the 65 - 70% range.    ELIGIBLE FOR AVAILABLE RESEARCH PROTOCOL:no  ASSESSMENT: 72 y.o. [place] woman (primarily residing in [place])  (1) status post left lumpectomy in 2012 for a reported pT2 pN1 breast cancer, the patient refusing adjuvant treatment (no chemotherapy, radiation, or antiestrogens).  METASTATIC DISEASE: December 2018 (bilateral triple positive disease) (2) status post left breast biopsy 03/30/2017 for a clinical T4 N0, stage IIIB invasive ductal carcinoma, grade 3, triple positive, with an MIB-1 of 70%  (a) staging studies 05/21/2017 show multiple bilateral pulmonary nodules consistent with stage IV disease 05/21/2017; there are no liver or bone lesions noted  (b) breast MRI 05/13/2017 shows a 7 cm area of non-masslike enhancement in the right breast, with biopsy ([id number]) on 06/24/2017 showing: Metastatic carcinoma. Prognostic panel was strongly ER and PR positive as well as HER-2 positive with the signals ratio of 2.61 and number per cell 5.35 (triple positive)  (3) neoadjuvant chemotherapy with T-DM1 started 06/29/2016  (a) echocardiogram 06/24/2017 showed an ejection fraction of 60-65 %  (B) echocardiogram 09/23/2017 showed an ejection fraction of 65-70%  (c) CT chest on 10/12/2017 shows progression in nodes, pulmonary nodules, and bone mets   (4) definitive surgery to follow once left chest wall disease under better control  (5) anastrozole written on 09/21/2017, never started by patient  (a) patient states  was unable to tolerate tamoxifen in the past (nausea).  (6) genetics testing pending  (7) chemotherapy with carboplatin, gemcitabine, and trastuzumab to start 11/01/2017, with Gemcitabine and Carboplatin given on day 1, and Gemcitabine, Carboplatin, and Trastuzumab given on day 8 to be repeated every 21 days x 6, or to optimal response  PLAN:  Tracy Howell is tolerating chemotherapy well and she will proceed to the second part of her third cycle today.  She is agreeable to continuing for 3 more cycles, but her husband is having some medical issues and she will not be able to be treated on 01/04/2018.  Accordingly cycle 4 will be delayed a week to 01/11/2018.  The left breast shows good evidence of response.  I am hopeful after 6 cycles she will be able to have a successful mastectomy.  She will then need adjuvant radiation likely with capecitabine sensitization.  She did not tolerate the OnPro.  We are omitting that.  She knows she may be  neutropenic next week and she understands that if she develops a temperature greater than 100 she will call our office for instructions  I have changed all the appointments and treatment dates accordingly.  She knows to call for any other issues that may develop before the next visit.  Magrinat, Virgie Dad, MD  12/21/17 10:25 AM Medical Oncology and Hematology [facility] [address] [place], [place] [address] Tel. [phone]    Fax. [phone]   I, Margit Banda am acting as a scribe for Chauncey Cruel, MD.   I, Lurline Del MD, have reviewed the above documentation for accuracy and completeness, and I agree with the above.

## 2017-12-21 ENCOUNTER — Inpatient Hospital Stay: Payer: Medicare Other

## 2017-12-21 ENCOUNTER — Telehealth: Payer: Self-pay | Admitting: Oncology

## 2017-12-21 ENCOUNTER — Inpatient Hospital Stay (HOSPITAL_BASED_OUTPATIENT_CLINIC_OR_DEPARTMENT_OTHER): Payer: Medicare Other | Admitting: Oncology

## 2017-12-21 VITALS — BP 147/57 | HR 98 | Temp 98.2°F | Resp 18 | Ht 66.5 in | Wt 225.5 lb

## 2017-12-21 DIAGNOSIS — Z803 Family history of malignant neoplasm of breast: Secondary | ICD-10-CM | POA: Diagnosis not present

## 2017-12-21 DIAGNOSIS — C50212 Malignant neoplasm of upper-inner quadrant of left female breast: Secondary | ICD-10-CM

## 2017-12-21 DIAGNOSIS — Z95828 Presence of other vascular implants and grafts: Secondary | ICD-10-CM

## 2017-12-21 DIAGNOSIS — C7802 Secondary malignant neoplasm of left lung: Secondary | ICD-10-CM | POA: Diagnosis not present

## 2017-12-21 DIAGNOSIS — Z5112 Encounter for antineoplastic immunotherapy: Secondary | ICD-10-CM | POA: Diagnosis not present

## 2017-12-21 DIAGNOSIS — G620 Drug-induced polyneuropathy: Secondary | ICD-10-CM

## 2017-12-21 DIAGNOSIS — Z170 Estrogen receptor positive status [ER+]: Principal | ICD-10-CM

## 2017-12-21 DIAGNOSIS — Z79899 Other long term (current) drug therapy: Secondary | ICD-10-CM

## 2017-12-21 DIAGNOSIS — Z853 Personal history of malignant neoplasm of breast: Secondary | ICD-10-CM | POA: Diagnosis not present

## 2017-12-21 DIAGNOSIS — C7801 Secondary malignant neoplasm of right lung: Secondary | ICD-10-CM

## 2017-12-21 DIAGNOSIS — C7951 Secondary malignant neoplasm of bone: Secondary | ICD-10-CM

## 2017-12-21 DIAGNOSIS — Z5111 Encounter for antineoplastic chemotherapy: Secondary | ICD-10-CM | POA: Diagnosis not present

## 2017-12-21 DIAGNOSIS — C7800 Secondary malignant neoplasm of unspecified lung: Secondary | ICD-10-CM

## 2017-12-21 LAB — CBC WITH DIFFERENTIAL (CANCER CENTER ONLY)
Basophils Absolute: 0 10*3/uL (ref 0.0–0.1)
Basophils Relative: 1 %
EOS ABS: 0.1 10*3/uL (ref 0.0–0.5)
EOS PCT: 2 %
HCT: 28.5 % — ABNORMAL LOW (ref 34.8–46.6)
HEMOGLOBIN: 9.2 g/dL — AB (ref 11.6–15.9)
Lymphocytes Relative: 43 %
Lymphs Abs: 1.9 10*3/uL (ref 0.9–3.3)
MCH: 30 pg (ref 25.1–34.0)
MCHC: 32.3 g/dL (ref 31.5–36.0)
MCV: 92.8 fL (ref 79.5–101.0)
MONO ABS: 0.2 10*3/uL (ref 0.1–0.9)
MONOS PCT: 5 %
NEUTROS PCT: 49 %
Neutro Abs: 2.1 10*3/uL (ref 1.5–6.5)
Platelet Count: 238 10*3/uL (ref 145–400)
RBC: 3.07 MIL/uL — ABNORMAL LOW (ref 3.70–5.45)
RDW: 19.2 % — AB (ref 11.2–14.5)
WBC Count: 4.3 10*3/uL (ref 3.9–10.3)

## 2017-12-21 LAB — CMP (CANCER CENTER ONLY)
ALK PHOS: 123 U/L (ref 38–126)
ALT: 23 U/L (ref 0–44)
AST: 33 U/L (ref 15–41)
Albumin: 3.1 g/dL — ABNORMAL LOW (ref 3.5–5.0)
Anion gap: 5 (ref 5–15)
BUN: 7 mg/dL — AB (ref 8–23)
CHLORIDE: 105 mmol/L (ref 98–111)
CO2: 30 mmol/L (ref 22–32)
CREATININE: 0.74 mg/dL (ref 0.44–1.00)
Calcium: 9.2 mg/dL (ref 8.9–10.3)
GFR, Estimated: >60 mL/min (ref >60)
GLUCOSE: 115 mg/dL — AB (ref 70–99)
Potassium: 3.6 mmol/L (ref 3.5–5.1)
Sodium: 140 mmol/L (ref 135–145)
Total Bilirubin: 0.3 mg/dL (ref 0.3–1.2)
Total Protein: 7.4 g/dL (ref 6.5–8.1)

## 2017-12-21 MED ORDER — DEXAMETHASONE SODIUM PHOSPHATE 10 MG/ML IJ SOLN
10.0000 mg | Freq: Once | INTRAMUSCULAR | Status: AC
  Administered 2017-12-21: 10 mg via INTRAVENOUS

## 2017-12-21 MED ORDER — PALONOSETRON HCL INJECTION 0.25 MG/5ML
0.2500 mg | Freq: Once | INTRAVENOUS | Status: AC
  Administered 2017-12-21: 0.25 mg via INTRAVENOUS

## 2017-12-21 MED ORDER — HEPARIN SOD (PORK) LOCK FLUSH 100 UNIT/ML IV SOLN
500.0000 [IU] | Freq: Once | INTRAVENOUS | Status: AC | PRN
  Administered 2017-12-21: 500 [IU]
  Filled 2017-12-21: qty 5

## 2017-12-21 MED ORDER — SODIUM CHLORIDE 0.9 % IV SOLN
800.0000 mg/m2 | Freq: Once | INTRAVENOUS | Status: AC
  Administered 2017-12-21: 1786 mg via INTRAVENOUS
  Filled 2017-12-21: qty 46.97

## 2017-12-21 MED ORDER — TRASTUZUMAB CHEMO 150 MG IV SOLR
600.0000 mg | Freq: Once | INTRAVENOUS | Status: AC
  Administered 2017-12-21: 600 mg via INTRAVENOUS
  Filled 2017-12-21: qty 28.57

## 2017-12-21 MED ORDER — ACETAMINOPHEN 325 MG PO TABS
ORAL_TABLET | ORAL | Status: AC
  Filled 2017-12-21: qty 2

## 2017-12-21 MED ORDER — DIPHENHYDRAMINE HCL 25 MG PO CAPS
ORAL_CAPSULE | ORAL | Status: AC
  Filled 2017-12-21: qty 1

## 2017-12-21 MED ORDER — SODIUM CHLORIDE 0.9 % IV SOLN
Freq: Once | INTRAVENOUS | Status: AC
  Administered 2017-12-21: 13:00:00 via INTRAVENOUS

## 2017-12-21 MED ORDER — SODIUM CHLORIDE 0.9% FLUSH
10.0000 mL | Freq: Once | INTRAVENOUS | Status: AC
  Administered 2017-12-21: 10 mL
  Filled 2017-12-21: qty 10

## 2017-12-21 MED ORDER — ACETAMINOPHEN 325 MG PO TABS
650.0000 mg | ORAL_TABLET | Freq: Once | ORAL | Status: AC
  Administered 2017-12-21: 650 mg via ORAL

## 2017-12-21 MED ORDER — DIPHENHYDRAMINE HCL 25 MG PO CAPS
25.0000 mg | ORAL_CAPSULE | Freq: Once | ORAL | Status: AC
  Administered 2017-12-21: 25 mg via ORAL

## 2017-12-21 MED ORDER — SODIUM CHLORIDE 0.9% FLUSH
10.0000 mL | INTRAVENOUS | Status: DC | PRN
  Administered 2017-12-21: 10 mL
  Filled 2017-12-21: qty 10

## 2017-12-21 MED ORDER — DEXAMETHASONE SODIUM PHOSPHATE 10 MG/ML IJ SOLN
INTRAMUSCULAR | Status: AC
  Filled 2017-12-21: qty 1

## 2017-12-21 MED ORDER — PALONOSETRON HCL INJECTION 0.25 MG/5ML
INTRAVENOUS | Status: AC
  Filled 2017-12-21: qty 5

## 2017-12-21 MED ORDER — SODIUM CHLORIDE 0.9 % IV SOLN
214.6000 mg | Freq: Once | INTRAVENOUS | Status: AC
  Administered 2017-12-21: 210 mg via INTRAVENOUS
  Filled 2017-12-21: qty 21

## 2017-12-21 NOTE — Telephone Encounter (Signed)
Patient scheduled per 7/9 los.  Patient scheduled with GM at 11:30 due to avail.

## 2017-12-21 NOTE — Patient Instructions (Signed)
[place] Discharge Instructions for Patients Receiving Chemotherapy  Today you received the following chemotherapy agents Herceptin, Gemzar and Carboplatin  To help prevent nausea and vomiting after your treatment, we encourage you to take your nausea medication as directed   If you develop nausea and vomiting that is not controlled by your nausea medication, call the clinic.   BELOW ARE SYMPTOMS THAT SHOULD BE REPORTED IMMEDIATELY:  *FEVER GREATER THAN 100.5 F  *CHILLS WITH OR WITHOUT FEVER  NAUSEA AND VOMITING THAT IS NOT CONTROLLED WITH YOUR NAUSEA MEDICATION  *UNUSUAL SHORTNESS OF BREATH  *UNUSUAL BRUISING OR BLEEDING  TENDERNESS IN MOUTH AND THROAT WITH OR WITHOUT PRESENCE OF ULCERS  *URINARY PROBLEMS  *BOWEL PROBLEMS  UNUSUAL RASH Items with * indicate a potential emergency and should be followed up as soon as possible.  Feel free to call the clinic should you have any questions or concerns. The clinic phone number is [phone].  Please show the [place] at check-in to the Emergency Department and triage nurse.

## 2018-01-11 ENCOUNTER — Inpatient Hospital Stay: Payer: Medicare Other

## 2018-01-11 ENCOUNTER — Ambulatory Visit (HOSPITAL_COMMUNITY): Admission: RE | Admit: 2018-01-11 | Payer: Medicare Other | Source: Ambulatory Visit | Admitting: Internal Medicine

## 2018-01-11 ENCOUNTER — Encounter: Payer: Self-pay | Admitting: Adult Health

## 2018-01-11 ENCOUNTER — Inpatient Hospital Stay (HOSPITAL_BASED_OUTPATIENT_CLINIC_OR_DEPARTMENT_OTHER): Payer: Medicare Other | Admitting: Adult Health

## 2018-01-11 ENCOUNTER — Telehealth: Payer: Self-pay | Admitting: Adult Health

## 2018-01-11 ENCOUNTER — Ambulatory Visit (HOSPITAL_COMMUNITY)
Admission: RE | Admit: 2018-01-11 | Discharge: 2018-01-11 | Disposition: A | Discharge status: Home / Self Care | Payer: Medicare Other | Source: Ambulatory Visit | Attending: Internal Medicine | Admitting: Internal Medicine

## 2018-01-11 ENCOUNTER — Encounter (HOSPITAL_COMMUNITY): Payer: Self-pay

## 2018-01-11 VITALS — BP 135/58 | HR 95 | Temp 98.3°F | Resp 18 | Ht 66.5 in | Wt 227.4 lb

## 2018-01-11 DIAGNOSIS — Z170 Estrogen receptor positive status [ER+]: Principal | ICD-10-CM

## 2018-01-11 DIAGNOSIS — C7802 Secondary malignant neoplasm of left lung: Secondary | ICD-10-CM

## 2018-01-11 DIAGNOSIS — C7801 Secondary malignant neoplasm of right lung: Secondary | ICD-10-CM | POA: Diagnosis not present

## 2018-01-11 DIAGNOSIS — I119 Hypertensive heart disease without heart failure: Secondary | ICD-10-CM | POA: Insufficient documentation

## 2018-01-11 DIAGNOSIS — G620 Drug-induced polyneuropathy: Secondary | ICD-10-CM

## 2018-01-11 DIAGNOSIS — Z803 Family history of malignant neoplasm of breast: Secondary | ICD-10-CM

## 2018-01-11 DIAGNOSIS — C50212 Malignant neoplasm of upper-inner quadrant of left female breast: Secondary | ICD-10-CM

## 2018-01-11 DIAGNOSIS — Z79899 Other long term (current) drug therapy: Secondary | ICD-10-CM | POA: Diagnosis not present

## 2018-01-11 DIAGNOSIS — Z853 Personal history of malignant neoplasm of breast: Secondary | ICD-10-CM | POA: Diagnosis not present

## 2018-01-11 DIAGNOSIS — Z95828 Presence of other vascular implants and grafts: Secondary | ICD-10-CM

## 2018-01-11 DIAGNOSIS — Z5112 Encounter for antineoplastic immunotherapy: Secondary | ICD-10-CM | POA: Diagnosis not present

## 2018-01-11 DIAGNOSIS — C7951 Secondary malignant neoplasm of bone: Secondary | ICD-10-CM

## 2018-01-11 DIAGNOSIS — C7800 Secondary malignant neoplasm of unspecified lung: Secondary | ICD-10-CM

## 2018-01-11 DIAGNOSIS — Z5111 Encounter for antineoplastic chemotherapy: Secondary | ICD-10-CM | POA: Diagnosis not present

## 2018-01-11 LAB — CBC WITH DIFFERENTIAL (CANCER CENTER ONLY)
BASOS ABS: 0.1 10*3/uL (ref 0.0–0.1)
Basophils Relative: 1 %
Eosinophils Absolute: 0.1 10*3/uL (ref 0.0–0.5)
Eosinophils Relative: 1 %
HEMATOCRIT: 29.2 % — AB (ref 34.8–46.6)
Hemoglobin: 10 g/dL — ABNORMAL LOW (ref 11.6–15.9)
LYMPHS ABS: 1.6 10*3/uL (ref 0.9–3.3)
LYMPHS PCT: 23 %
MCH: 32.2 pg (ref 25.1–34.0)
MCHC: 34.1 g/dL (ref 31.5–36.0)
MCV: 94.2 fL (ref 79.5–101.0)
MONO ABS: 0.8 10*3/uL (ref 0.1–0.9)
MONOS PCT: 11 %
NEUTROS ABS: 4.4 10*3/uL (ref 1.5–6.5)
Neutrophils Relative %: 64 %
Platelet Count: 369 10*3/uL (ref 145–400)
RBC: 3.1 MIL/uL — ABNORMAL LOW (ref 3.70–5.45)
RDW: 21.3 % — AB (ref 11.2–14.5)
WBC Count: 6.9 10*3/uL (ref 3.9–10.3)

## 2018-01-11 LAB — CMP (CANCER CENTER ONLY)
ALBUMIN: 3.1 g/dL — AB (ref 3.5–5.0)
ALT: 15 U/L (ref 0–44)
ANION GAP: 7 (ref 5–15)
AST: 29 U/L (ref 15–41)
Alkaline Phosphatase: 121 U/L (ref 38–126)
BUN: 9 mg/dL (ref 8–23)
CHLORIDE: 107 mmol/L (ref 98–111)
CO2: 27 mmol/L (ref 22–32)
Calcium: 9.1 mg/dL (ref 8.9–10.3)
Creatinine: 0.72 mg/dL (ref 0.44–1.00)
GFR, Est AFR Am: >60 mL/min (ref >60)
GFR, Estimated: >60 mL/min (ref >60)
GLUCOSE: 131 mg/dL — AB (ref 70–99)
POTASSIUM: 3.4 mmol/L — AB (ref 3.5–5.1)
SODIUM: 141 mmol/L (ref 135–145)
TOTAL PROTEIN: 7.2 g/dL (ref 6.5–8.1)
Total Bilirubin: 0.5 mg/dL (ref 0.3–1.2)

## 2018-01-11 MED ORDER — ACETAMINOPHEN 325 MG PO TABS
ORAL_TABLET | ORAL | Status: AC
  Filled 2018-01-11: qty 2

## 2018-01-11 MED ORDER — DIPHENHYDRAMINE HCL 25 MG PO CAPS
ORAL_CAPSULE | ORAL | Status: AC
Start: 2018-01-11 — End: ?
  Filled 2018-01-11: qty 1

## 2018-01-11 MED ORDER — LIDOCAINE-PRILOCAINE 2.5-2.5 % EX CREA: TOPICAL_CREAM | CUTANEOUS | 3 refills | Status: DC

## 2018-01-11 MED ORDER — DIPHENHYDRAMINE HCL 25 MG PO CAPS
25.0000 mg | ORAL_CAPSULE | Freq: Once | ORAL | Status: AC
  Administered 2018-01-11: 25 mg via ORAL

## 2018-01-11 MED ORDER — SODIUM CHLORIDE 0.9% FLUSH
10.0000 mL | INTRAVENOUS | Status: DC | PRN
  Administered 2018-01-11: 10 mL
  Filled 2018-01-11: qty 10

## 2018-01-11 MED ORDER — SODIUM CHLORIDE 0.9 % IV SOLN
Freq: Once | INTRAVENOUS | Status: AC
  Administered 2018-01-11: 13:00:00 via INTRAVENOUS
  Filled 2018-01-11: qty 250

## 2018-01-11 MED ORDER — PALONOSETRON HCL INJECTION 0.25 MG/5ML
0.2500 mg | Freq: Once | INTRAVENOUS | Status: AC
  Administered 2018-01-11: 0.25 mg via INTRAVENOUS

## 2018-01-11 MED ORDER — DEXAMETHASONE SODIUM PHOSPHATE 10 MG/ML IJ SOLN
10.0000 mg | Freq: Once | INTRAMUSCULAR | Status: AC
  Administered 2018-01-11: 10 mg via INTRAVENOUS

## 2018-01-11 MED ORDER — SODIUM CHLORIDE 0.9% FLUSH
10.0000 mL | Freq: Once | INTRAVENOUS | Status: AC
  Administered 2018-01-11: 10 mL
  Filled 2018-01-11: qty 10

## 2018-01-11 MED ORDER — TRASTUZUMAB CHEMO 150 MG IV SOLR
600.0000 mg | Freq: Once | INTRAVENOUS | Status: AC
  Administered 2018-01-11: 600 mg via INTRAVENOUS
  Filled 2018-01-11: qty 28.57

## 2018-01-11 MED ORDER — HEPARIN SOD (PORK) LOCK FLUSH 100 UNIT/ML IV SOLN
500.0000 [IU] | Freq: Once | INTRAVENOUS | Status: AC | PRN
  Administered 2018-01-11: 500 [IU]
  Filled 2018-01-11: qty 5

## 2018-01-11 MED ORDER — PALONOSETRON HCL INJECTION 0.25 MG/5ML
INTRAVENOUS | Status: AC
  Filled 2018-01-11: qty 5

## 2018-01-11 MED ORDER — DEXAMETHASONE SODIUM PHOSPHATE 10 MG/ML IJ SOLN
INTRAMUSCULAR | Status: AC
  Filled 2018-01-11: qty 1

## 2018-01-11 MED ORDER — SODIUM CHLORIDE 0.9 % IV SOLN
800.0000 mg/m2 | Freq: Once | INTRAVENOUS | Status: AC
  Administered 2018-01-11: 1786 mg via INTRAVENOUS
  Filled 2018-01-11: qty 46.97

## 2018-01-11 MED ORDER — ACETAMINOPHEN 325 MG PO TABS
650.0000 mg | ORAL_TABLET | Freq: Once | ORAL | Status: AC
  Administered 2018-01-11: 650 mg via ORAL

## 2018-01-11 NOTE — Progress Notes (Signed)
  Echocardiogram 2D Echocardiogram has been performed.  Tracy Howell 01/11/2018, 8:41 AM

## 2018-01-11 NOTE — Patient Instructions (Signed)
[place] Discharge Instructions for Patients Receiving Chemotherapy  Today you received the following chemotherapy agents Herceptin, Gemzar.  To help prevent nausea and vomiting after your treatment, we encourage you to take your nausea medication as directed. DO NOT TAKE ZOFRAN FOR THREE DAYS AFTER TREATMENT.   If you develop nausea and vomiting that is not controlled by your nausea medication, call the clinic.   BELOW ARE SYMPTOMS THAT SHOULD BE REPORTED IMMEDIATELY:  *FEVER GREATER THAN 100.5 F  *CHILLS WITH OR WITHOUT FEVER  NAUSEA AND VOMITING THAT IS NOT CONTROLLED WITH YOUR NAUSEA MEDICATION  *UNUSUAL SHORTNESS OF BREATH  *UNUSUAL BRUISING OR BLEEDING  TENDERNESS IN MOUTH AND THROAT WITH OR WITHOUT PRESENCE OF ULCERS  *URINARY PROBLEMS  *BOWEL PROBLEMS  UNUSUAL RASH Items with * indicate a potential emergency and should be followed up as soon as possible.  Feel free to call the clinic should you have any questions or concerns. The clinic phone number is [phone].  Please show the [place] at check-in to the Emergency Department and triage nurse.

## 2018-01-11 NOTE — Telephone Encounter (Signed)
No 7/30 los orders.

## 2018-01-11 NOTE — Progress Notes (Signed)
[place]  Telephone:(336) [phone] [phone]) [phone]     ID: Benedicta Sultan DOB: Oct 25, 1945  MR#: [id number]  EXB#:[id number]  Patient Care Team: Lucianne Lei, MD as PCP - General (Family Medicine) Alphonsa Overall, MD as Consulting Physician (General Surgery) Magrinat, Virgie Dad, MD as Consulting Physician (Oncology) Eppie Gibson, MD as Attending Physician (Radiation Oncology) Juanita Craver, MD as Consulting Physician (Gastroenterology) Larey Dresser, MD as Consulting Physician (Cardiology) OTHER MD:  CHIEF COMPLAINT: triple positive bilateral breast cancer  CURRENT TREATMENT: Gemcitabine, Carboplatin, Trastuzumab   HISTORY OF CURRENT ILLNESS: From the original intake note:  "Tracy Howell" tells me she underwent left lumpectomy in [place] on 2012 for a 2.5 cm, grade 2 breast cancer involving one lymph node of 5 sampled. [ It may have been only 2 lymph nodes that were removed she says.]  This was at the [facility] and [facility], currently the [place] [facility] on [address].  The patient says she was told could have more treatment if she wanted it.  She did not see any sense in it.  She feels her left breast cancer was caused by the fact that she kept her iPhone in her bra right by the left breast.  Sometime in April 2018 she noted a new mass in the left breast. She did not immediately bring it to medical attention but as it continued to grow she mentioned her to her primary care physician and on 03/29/2017 Tracy Howell underwent bilateral diagnostic mammography with tomography and left breast ultrasonography at [facility].  The breast density was category B.  In the left breast central to the nipple there was a 6 cm irregular mass with indistinct margins and heterogeneous calcifications.  By ultrasound this measured 5 cm, with indistinct margins, at the 12:00 anterior area.  There was associated edema.  The left axilla was sonographically  benign.  Biopsy of the left breast area in question March 30, 2017 showed ([id number]) invasive ductal carcinoma, grade 3, estrogen receptor 100% positive, progesterone receptor 60% positive, both with strong staining intensity, HER-2 amplified, with a signals ratio of 3.38, and the number per cell 7.95.  The  MIB-1 was 70%.  The patient's subsequent history is as detailed below.  INTERVAL HISTORY: Chanell "Tracy Howell" Manganelli returns today for follow-up and treatment of her triple positive bilateral breast cancer. She is accompanied by her husband.  She receives Gemcitabine, Carboplatin, and Trastuzumab given on days 1 and 8 of a 21 day cycle. She receives the Gemcitabine and Carboplatin on day 1 and the Trastuzumab and Gemcitabine and carboplatin on day 8. Today is day 1 of cycle 4 of 6 planned.   Her echo on 01/11/2018 showed and ejection fraction in the 65 - 70% range. She was seen by Dr. Haroldine Laws.  REVIEW OF SYSTEMS: [name] is doing well today.  She does have some skin darkening in certain areas and this is not particularly bothersome.  These areas do not itch.  She notes some pain in her eyes following treatment.  She says this happens for a few days and then resolves. Sometimes this will accompany blurred vision.  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  She says that it is only in the fingers, and that she notes she is dropping stuff more frequently.  She says she noted this started after she started chemotherapy.  Otherwise she is doing well and a detailed ROS was non contributory.    PAST MEDICAL HISTORY: Past Medical History:  Diagnosis Date  . Angioedema   . Breast cancer ([place]) 2013  . Complication of anesthesia    slow to wake up   . Pneumonia    hx of x 2   . recurrent left breast ca dx'd 04/2017  . SBO (small bowel obstruction) ([place]) 08/13/2014    PAST SURGICAL HISTORY: Past Surgical History:  Procedure Laterality Date  . ABDOMINAL HYSTERECTOMY    .  APPENDECTOMY    . BREAST LUMPECTOMY  2013  . BREAST SURGERY    . CESAREAN SECTION     x2  . CHOLECYSTECTOMY    . PORTACATH PLACEMENT Right 05/17/2017   Procedure: INSERTION PORT-A-CATH right subclavian vein;  Surgeon: Alphonsa Overall, MD;  Location: [facility];  Service: General;  Laterality: Right;    FAMILY HISTORY Family History  Problem Relation Age of Onset  . Angioedema Mother   . Breast cancer Mother   . Breast cancer Maternal Aunt   The patient was adopted.  Her adoptive parents died in their 81s.  The patient's birth mother however died in her 59s from breast cancer.  The patient believes she has 2 brothers is not sure about sisters.  However she does have 2 maternal aunts who had breast cancer.  There is no history of ovarian cancer in the family to her knowledge.  The patient has no information regarding her father or his side of the family.   GYNECOLOGIC HISTORY:  No LMP recorded. Patient has had a hysterectomy. Menarche age 59, first live birth age 11, she is [place] P2.  She underwent hysterectomy at age 30.  She tells me they left a fourth of an ovary at that time.  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  She actually lives in [place], [place], and spends 2 weeks here every 2 months at her job.  When she is in town she actually lives with Dr. Criss Rosales. 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  She recently married [name] who is a Camera operator.  He has 2 children of his own, in [place] and [place].  The patient's own children are [name] who lives in [place] and works in Engineer, technical sales, and [employer] lives in [place] and owns a [employer].  The patient has no grandchildren.  She is 1/7-day [facility].  (She tells me her religion has no bands on any medical treatments but  she cannot have alcohol, cigarettes, or pork).   ADVANCED DIRECTIVES:    HEALTH MAINTENANCE: Social History   Tobacco Use  . Smoking status: Never Smoker  . Smokeless tobacco: Never Used  Substance Use Topics  . Alcohol use: No  . Drug use: No     Colonoscopy:  PAP:  Bone density:   Allergies  Allergen Reactions  . Onion Anaphylaxis, Nausea And Vomiting and Swelling    Throat swelling.  . Other Anaphylaxis and Nausea And Vomiting    Green Peppers-anaphylactic, nausea/vomiting/swelling  Mushrooms-nausea/vomiting.  Patient has hereditary angioedema (HAE)  . Aleve [Naproxen] Other (See Comments)    bleeding  . Codeine Nausea And Vomiting  . Morphine And Related Nausea And Vomiting  . Penicillins Rash    Vomiting Has patient had a PCN reaction causing immediate rash, facial/tongue/throat swelling, SOB or lightheadedness with hypotension: Yes Has patient had a  PCN reaction causing severe rash involving mucus membranes or skin necrosis: Yes Has patient had a PCN reaction that required hospitalization:Was inpatient when reaction occurred. Has patient had a PCN reaction occurring within the last 10 years: No If all of the above answers are "NO", then may proceed with Cephalosporin use.     Current Outpatient Medications  Medication Sig Dispense Refill  . anastrozole (ARIMIDEX) 1 MG tablet Take 1 tablet (1 mg total) by mouth daily. 90 tablet 4  . Ascorbic Acid (VITAMIN C PO) Take 1 tablet by mouth daily.    . B Complex-C (B-COMPLEX WITH VITAMIN C) tablet Take 1 tablet by mouth daily.    . COD LIVER OIL PO Take 1 capsule by mouth daily.    Marland Kitchen dexamethasone (DECADRON) 4 MG tablet Take 2 tablets (8 mg total) by mouth daily. Start the day after chemotherapy for 2 days. Take with food. 30 tablet 1  . lidocaine-prilocaine (EMLA) cream Apply 1 application topically as needed. 30 g 0  . lidocaine-prilocaine (EMLA) cream Apply to affected area once 30 g 3  . LORazepam (ATIVAN) 0.5  MG tablet Take 1 tablet (0.5 mg total) by mouth at bedtime as needed (Nausea or vomiting). 30 tablet 0  . Multiple Vitamin (MULTIVITAMIN WITH MINERALS) TABS tablet Take 1 tablet by mouth daily.    . prochlorperazine (COMPAZINE) 10 MG tablet Take 1 tablet (10 mg total) by mouth every 6 (six) hours as needed (Nausea or vomiting). 30 tablet 1  . traMADol (ULTRAM) 50 MG tablet Take 1 tablet (50 mg total) by mouth every 6 (six) hours as needed. 30 tablet 0  . triamterene-hydrochlorothiazide (DYAZIDE) 37.5-25 MG capsule Take 1 each (1 capsule total) by mouth daily. 30 capsule 0   No current facility-administered medications for this visit.     OBJECTIVE:   Vitals:   01/11/18 1100  BP: (!) 135/58  Pulse: 95  Resp: 18  Temp: 98.3 F (36.8 C)  SpO2: 100%     Body mass index is 36.15 kg/m.   Wt Readings from Last 3 Encounters:  01/11/18 227 lb 6.4 oz (103.1 kg)  12/21/17 225 lb 8 oz (102.3 kg)  12/14/17 222 lb 6.4 oz (100.9 kg)  ECOG FS:1 - Symptomatic but completely ambulatory   GENERAL: Patient is a well appearing female in no acute distress HEENT:  Sclerae anicteric.  Oropharynx clear and moist. No ulcerations or evidence of oropharyngeal candidiasis. Neck is supple.  NODES:  No cervical, supraclavicular, or axillary lymphadenopathy palpated.  BREAST EXAM:  See pictures below LUNGS:  Clear to auscultation bilaterally.  No wheezes or rhonchi. HEART:  Regular rate and rhythm. No murmur appreciated. ABDOMEN:  Soft, nontender.  Positive, normoactive bowel sounds. No organomegaly palpated. MSK:  No focal spinal tenderness to palpation. Full range of motion bilaterally in the upper extremities. EXTREMITIES:  No peripheral edema.   SKIN:  Clear with no obvious rashes or skin changes. No nail dyscrasia. NEURO:  Nonfocal. Well oriented.  Appropriate affect.     Breast on 11/01/2017   Breast on 01/11/2018         LAB RESULTS:  CMP     Component Value Date/Time   NA 141  01/11/2018 1034   NA 142 05/12/2017 0808   K 3.4 (L) 01/11/2018 1034   K 3.5 05/12/2017 0808   CL 107 01/11/2018 1034   CO2 27 01/11/2018 1034   CO2 26 05/12/2017 0808   GLUCOSE 131 (H) 01/11/2018 1034   GLUCOSE  133 05/12/2017 0808   BUN 9 01/11/2018 1034   BUN 9.8 05/12/2017 0808   CREATININE 0.72 01/11/2018 1034   CREATININE 0.9 05/12/2017 0808   CALCIUM 9.1 01/11/2018 1034   CALCIUM 9.3 05/12/2017 0808   PROT 7.2 01/11/2018 1034   PROT 7.3 05/12/2017 0808   ALBUMIN 3.1 (L) 01/11/2018 1034   ALBUMIN 3.3 (L) 05/12/2017 0808   AST 29 01/11/2018 1034   AST 15 05/12/2017 0808   ALT 15 01/11/2018 1034   ALT 10 05/12/2017 0808   ALKPHOS 121 01/11/2018 1034   ALKPHOS 90 05/12/2017 0808   BILITOT 0.5 01/11/2018 1034   BILITOT 0.56 05/12/2017 0808   GFRNONAA >60 01/11/2018 1034   GFRAA >60 01/11/2018 1034    No results found for: Ronnald Ramp, A1GS, A2GS, BETS, BETA2SER, GAMS, MSPIKE, SPEI  No results found for: Nils Pyle, [facility]  Lab Results  Component Value Date   WBC 6.9 01/11/2018   NEUTROABS 4.4 01/11/2018   HGB 10.0 (L) 01/11/2018   HCT 29.2 (L) 01/11/2018   MCV 94.2 01/11/2018   PLT 369 01/11/2018      Chemistry      Component Value Date/Time   NA 141 01/11/2018 1034   NA 142 05/12/2017 0808   K 3.4 (L) 01/11/2018 1034   K 3.5 05/12/2017 0808   CL 107 01/11/2018 1034   CO2 27 01/11/2018 1034   CO2 26 05/12/2017 0808   BUN 9 01/11/2018 1034   BUN 9.8 05/12/2017 0808   CREATININE 0.72 01/11/2018 1034   CREATININE 0.9 05/12/2017 0808      Component Value Date/Time   CALCIUM 9.1 01/11/2018 1034   CALCIUM 9.3 05/12/2017 0808   ALKPHOS 121 01/11/2018 1034   ALKPHOS 90 05/12/2017 0808   AST 29 01/11/2018 1034   AST 15 05/12/2017 0808   ALT 15 01/11/2018 1034   ALT 10 05/12/2017 0808   BILITOT 0.5 01/11/2018 1034   BILITOT 0.56 05/12/2017 0808       No results found for: LABCA2  No components found for:  LHTDSK876  No results for input(s): INR in the last 168 hours.  No results found for: LABCA2  No results found for: CAN199  No results found for: OTL572  No results found for: IOM355  Lab Results  Component Value Date   CA2729 23.5 12/14/2017    No components found for: HGQUANT  No results found for: CEA1 / No results found for: CEA1   No results found for: AFPTUMOR  No results found for: CHROMOGRNA  No results found for: PSA1  Appointment on 01/11/2018  Component Date Value Ref Range Status  . Sodium 01/11/2018 141  135 - 145 mmol/L Final  . Potassium 01/11/2018 3.4* 3.5 - 5.1 mmol/L Final  . Chloride 01/11/2018 107  98 - 111 mmol/L Final  . CO2 01/11/2018 27  22 - 32 mmol/L Final  . Glucose, Bld 01/11/2018 131* 70 - 99 mg/dL Final  . BUN 01/11/2018 9  8 - 23 mg/dL Final  . Creatinine 01/11/2018 0.72  0.44 - 1.00 mg/dL Final  . Calcium 01/11/2018 9.1  8.9 - 10.3 mg/dL Final  . Total Protein 01/11/2018 7.2  6.5 - 8.1 g/dL Final  . Albumin 01/11/2018 3.1* 3.5 - 5.0 g/dL Final  . AST 01/11/2018 29  15 - 41 U/L Final  . ALT 01/11/2018 15  0 - 44 U/L Final  . Alkaline Phosphatase 01/11/2018 121  38 - 126 U/L Final  . Total Bilirubin 01/11/2018  0.5  0.3 - 1.2 mg/dL Final  . GFR, Est Non Af Am 01/11/2018 >60  >60 mL/min Final  . GFR, Est AFR Am 01/11/2018 >60  >60 mL/min Final   Comment: (NOTE) The eGFR has been calculated using the CKD EPI equation. This calculation has not been validated in all clinical situations. eGFR's persistently <60 mL/min signify possible Chronic Kidney Disease.   Georgiann Hahn gap 01/11/2018 7  5 - 15 Final   Performed at [facility], [place] [address], [place], [place] [address]  . WBC Count 01/11/2018 6.9  3.9 - 10.3 K/uL Final  . RBC 01/11/2018 3.10* 3.70 - 5.45 MIL/uL Final  . Hemoglobin 01/11/2018 10.0* 11.6 - 15.9 g/dL Final  . HCT 01/11/2018 29.2* 34.8 - 46.6 % Final  . MCV 01/11/2018 94.2  79.5 - 101.0 fL Final  .  MCH 01/11/2018 32.2  25.1 - 34.0 pg Final  . MCHC 01/11/2018 34.1  31.5 - 36.0 g/dL Final  . RDW 01/11/2018 21.3* 11.2 - 14.5 % Final  . Platelet Count 01/11/2018 369  145 - 400 K/uL Final  . Neutrophils Relative % 01/11/2018 64  % Final  . Neutro Abs 01/11/2018 4.4  1.5 - 6.5 K/uL Final  . Lymphocytes Relative 01/11/2018 23  % Final  . Lymphs Abs 01/11/2018 1.6  0.9 - 3.3 K/uL Final  . Monocytes Relative 01/11/2018 11  % Final  . Monocytes Absolute 01/11/2018 0.8  0.1 - 0.9 K/uL Final  . Eosinophils Relative 01/11/2018 1  % Final  . Eosinophils Absolute 01/11/2018 0.1  0.0 - 0.5 K/uL Final  . Basophils Relative 01/11/2018 1  % Final  . Basophils Absolute 01/11/2018 0.1  0.0 - 0.1 K/uL Final   Performed at [facility], [address] [place], [place] [address]    (this displays the last labs from the last 3 days)  No results found for: TOTALPROTELP, ALBUMINELP, A1GS, A2GS, BETS, BETA2SER, GAMS, MSPIKE, SPEI (this displays SPEP labs)  No results found for: KPAFRELGTCHN, LAMBDASER, KAPLAMBRATIO (kappa/lambda light chains)  No results found for: HGBA, HGBA2QUANT, HGBFQUANT, HGBSQUAN (Hemoglobinopathy evaluation)   No results found for: LDH  No results found for: IRON, TIBC, IRONPCTSAT (Iron and TIBC)  No results found for: FERRITIN  Urinalysis    Component Value Date/Time   COLORURINE YELLOW 08/10/2017 [place] 08/10/2017 0924   LABSPEC 1.010 08/10/2017 0924   PHURINE 6.5 08/10/2017 [place] 08/10/2017 0924   HGBUR NEGATIVE 08/10/2017 0924   BILIRUBINUR NEGATIVE 08/10/2017 0924   KETONESUR NEGATIVE 08/10/2017 0924   PROTEINUR NEGATIVE 08/10/2017 0924   NITRITE NEGATIVE 08/10/2017 0924   LEUKOCYTESUR SMALL (A) 08/10/2017 0924     STUDIES: Her echo on 09/23/2017 showed and ejection fraction in the 65 - 70% range.    ELIGIBLE FOR AVAILABLE RESEARCH PROTOCOL:no  ASSESSMENT: 72 y.o. [place] woman (primarily  residing in [place])  (1) status post left lumpectomy in 2012 for a reported pT2 pN1 breast cancer, the patient refusing adjuvant treatment (no chemotherapy, radiation, or antiestrogens).  METASTATIC DISEASE: December 2018 (bilateral triple positive disease) (2) status post left breast biopsy 03/30/2017 for a clinical T4 N0, stage IIIB invasive ductal carcinoma, grade 3, triple positive, with an MIB-1 of 70%  (a) staging studies 05/21/2017 show multiple bilateral pulmonary nodules consistent with stage IV disease 05/21/2017; there are no liver or bone lesions noted  (b) breast MRI 05/13/2017 shows a 7 cm area of non-masslike enhancement  in the right breast, with biopsy ([id number]) on 06/24/2017 showing: Metastatic carcinoma. Prognostic panel was strongly ER and PR positive as well as HER-2 positive with the signals ratio of 2.61 and number per cell 5.35 (triple positive)  (3) neoadjuvant chemotherapy with T-DM1 started 06/29/2016  (a) echocardiogram 06/24/2017 showed an ejection fraction of 60-65 %  (B) echocardiogram 09/23/2017 showed an ejection fraction of 65-70%  (c) CT chest on 10/12/2017 shows progression in nodes, pulmonary nodules, and bone mets   (4) definitive surgery to follow once left chest wall disease under better control  (5) anastrozole written on 09/21/2017, never started by patient  (a) patient states was unable to tolerate tamoxifen in the past (nausea).  (6) genetics testing pending  (7) chemotherapy with carboplatin, gemcitabine, and trastuzumab to start 11/01/2017, with Gemcitabine and Carboplatin given on day 1, and Gemcitabine, Carboplatin, and Trastuzumab given on day 8 to be repeated every 21 days x 6, or to optimal response  PLAN: Tracy Howell is doing moderately well today. I reviewed her adverse effects with Dr. Lindi Adie.  He would like to hold the Carboplatin with today's dose and for her to receive the Gemcitabine and see how she does.  I recommended she use systane eye  gtts for her eyes, however if it the pain continues, she should see ophthalmology.    She will return in 1 week for labs and f/u with Dr. Jana Hakim.  She also sees Dr. Lucia Gaskins next week.  She knows to call for any questions or concerns prior to her next visit with [place].    A total of (30) minutes of face-to-face time was spent with this patient with greater than 50% of that time in counseling and care-coordination.   Wilber Bihari, NP  01/11/18 11:25 AM Medical Oncology and Hematology [facility] [address] [place], [place] [address] Tel. [phone]    Fax. [phone]

## 2018-01-11 NOTE — Progress Notes (Signed)
[phone]

## 2018-01-12 LAB — CANCER ANTIGEN 27.29: CA 27.29: 23 U/mL (ref 0.0–38.6)

## 2018-01-17 NOTE — Progress Notes (Addendum)
[place]  [phone] [phone]     ID: Letica Giaimo DOB: 05/22/1946  MR#: [id number]  UVO#:[id number]  Patient Care Team: Lucianne Lei, MD as PCP - General (Family Medicine) Alphonsa Overall, MD as Consulting Physician (General Surgery) Lissete Maestas, Virgie Dad, MD as Consulting Physician (Oncology) Eppie Gibson, MD as Attending Physician (Radiation Oncology) Juanita Craver, MD as Consulting Physician (Gastroenterology) Larey Dresser, MD as Consulting Physician (Cardiology) OTHER MD:  CHIEF COMPLAINT: triple positive bilateral breast cancer  CURRENT TREATMENT:  Trastuzumab; (anastrozole); awaiting definitive surgery   HISTORY OF CURRENT ILLNESS: From the original intake note:  "Tracy Howell" tells me she underwent left lumpectomy in [place] on 2012 for a 2.5 cm, grade 2 breast cancer involving one lymph node of 5 sampled. [ It may have been only 2 lymph nodes that were removed she says.]  This was at the [facility], currently the [place] [facility] on [place].  The patient says she was told could have more treatment if she wanted it.  She did not see any sense in it.  She feels her left breast cancer was caused by the fact that she kept her iPhone in her bra right by the left breast.  Sometime in April 2018 she noted a new mass in the left breast. She did not immediately bring it to medical attention but as it continued to grow she mentioned her to her primary care physician and on 03/29/2017 Tracy Howell underwent bilateral diagnostic mammography with tomography and left breast ultrasonography at [facility].  The breast density was category B.  In the left breast central to the nipple there was a 6 cm irregular mass with indistinct margins and heterogeneous calcifications.  By ultrasound this measured 5 cm, with indistinct margins, at the 12:00 anterior area.  There was associated edema.  The left axilla was  sonographically benign.  Biopsy of the left breast area in question March 30, 2017 showed ([id number]) invasive ductal carcinoma, grade 3, estrogen receptor 100% positive, progesterone receptor 60% positive, both with strong staining intensity, HER-2 amplified, with a signals ratio of 3.38, and the number per cell 7.95.  The  MIB-1 was 70%.  The patient's subsequent history is as detailed below.  INTERVAL HISTORY: Tracy Howell returns today for follow-up and treatment of her triple positive bilateral breast cancer accompanied by her husband.   She receives gemcitabine and carboplatin on days 1 and 8 of every 21 day cycle. She also receives trastuzumab on day 8 of every 21 day cycle. Today is day 8 cycle 4 of 6 planned cycles. The carboplatin was held on day 1 cycle 4 due to neuropathy.   During cycle 3, she had trouble seeing and eye pain. 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 She has blistering and burning sensations on her fingers on both hands. She reports no neuropathy involving her toes.  She completed an echocardiogram on 07/302/109 which showed an ejection fraction in the 65- 70% range.    REVIEW OF SYSTEMS: Tracy Howell is doing well today. She continues to work. She has some areas of skin darkening, particularly on both arms. She denies unusual headaches, visual changes, nausea, vomiting, or dizziness. There has been no unusual cough, phlegm production, or pleurisy. This been no change in bowel or bladder habits. She denies unexplained fatigue or unexplained weight loss, bleeding, rash, or fever. A detailed review of systems was otherwise stable.  PAST MEDICAL HISTORY: Past Medical History:  Diagnosis Date  . Angioedema   . Breast cancer ([place]) 2013  . Complication of anesthesia    slow to wake up   . Pneumonia    hx of x 2   . recurrent left breast ca dx'd 04/2017  . SBO (small bowel obstruction) ([place]) 08/13/2014    PAST  SURGICAL HISTORY: Past Surgical History:  Procedure Laterality Date  . ABDOMINAL HYSTERECTOMY    . APPENDECTOMY    . BREAST LUMPECTOMY  2013  . BREAST SURGERY    . CESAREAN SECTION     x2  . CHOLECYSTECTOMY    . PORTACATH PLACEMENT Right 05/17/2017   Procedure: INSERTION PORT-A-CATH right subclavian vein;  Surgeon: Alphonsa Overall, MD;  Location: [facility];  Service: General;  Laterality: Right;    FAMILY HISTORY Family History  Problem Relation Age of Onset  . Angioedema Mother   . Breast cancer Mother   . Breast cancer Maternal Aunt   The patient was adopted.  Her adoptive parents died in their 72s.  The patient's birth mother however died in her 34s from breast cancer.  The patient believes she has 2 brothers is not sure about sisters.  However she does have 2 maternal aunts who had breast cancer.  There is no history of ovarian cancer in the family to her knowledge.  The patient has no information regarding her father or his side of the family.   GYNECOLOGIC HISTORY:  No LMP recorded. Patient has had a hysterectomy. Menarche age 46, first live birth age 72, she is B[place] P2.  She underwent hysterectomy at age 31.  She tells me they left a fourth of an ovary at that time.  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  She actually lives in [place], [place], and spends 2 weeks here every 2 months at her job.  When she is in town she actually lives with Dr. Criss Rosales. 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  She recently married [name] who is a Camera operator.  He has 2 children of his own, in [place] and [place].  The patient's own children are [name] who lives in [place] and works in Engineer, technical sales, and [employer] lives in [place] and owns a [employer].  The patient has no  grandchildren.  She is 1/7-day [facility].  (She tells me her religion has no bands on any medical treatments but she cannot have alcohol, cigarettes, or pork).   ADVANCED DIRECTIVES:    HEALTH MAINTENANCE: Social History   Tobacco Use  . Smoking status: Never Smoker  . Smokeless tobacco: Never Used  Substance Use Topics  . Alcohol use: No  . Drug use: No     Colonoscopy:  PAP:  Bone density:   Allergies  Allergen Reactions  . Onion Anaphylaxis, Nausea And Vomiting and Swelling    Throat swelling.  . Other Anaphylaxis and Nausea And Vomiting    Green Peppers-anaphylactic, nausea/vomiting/swelling  Mushrooms-nausea/vomiting.  Patient has hereditary angioedema (HAE)  . Aleve [Naproxen] Other (See Comments)    bleeding  . Codeine Nausea And Vomiting  . Morphine And Related Nausea And Vomiting  . Penicillins Rash    Vomiting Has patient had a PCN reaction causing immediate rash, facial/tongue/throat swelling, SOB or lightheadedness  with hypotension: Yes Has patient had a PCN reaction causing severe rash involving mucus membranes or skin necrosis: Yes Has patient had a PCN reaction that required hospitalization:Was inpatient when reaction occurred. Has patient had a PCN reaction occurring within the last 10 years: No If all of the above answers are "NO", then may proceed with Cephalosporin use.     Current Outpatient Medications  Medication Sig Dispense Refill  . anastrozole (ARIMIDEX) 1 MG tablet Take 1 tablet (1 mg total) by mouth daily. 90 tablet 4  . Ascorbic Acid (VITAMIN C PO) Take 1 tablet by mouth daily.    . B Complex-C (B-COMPLEX WITH VITAMIN C) tablet Take 1 tablet by mouth daily.    . COD LIVER OIL PO Take 1 capsule by mouth daily.    Marland Kitchen dexamethasone (DECADRON) 4 MG tablet Take 2 tablets (8 mg total) by mouth daily. Start the day after chemotherapy for 2 days. Take with food. 30 tablet 1  . lidocaine-prilocaine (EMLA) cream Apply to affected area once 30 g 3    . LORazepam (ATIVAN) 0.5 MG tablet Take 1 tablet (0.5 mg total) by mouth at bedtime as needed (Nausea or vomiting). 30 tablet 0  . Multiple Vitamin (MULTIVITAMIN WITH MINERALS) TABS tablet Take 1 tablet by mouth daily.    . prochlorperazine (COMPAZINE) 10 MG tablet Take 1 tablet (10 mg total) by mouth every 6 (six) hours as needed (Nausea or vomiting). 30 tablet 1  . traMADol (ULTRAM) 50 MG tablet Take 1 tablet (50 mg total) by mouth every 6 (six) hours as needed. 30 tablet 0  . triamterene-hydrochlorothiazide (DYAZIDE) 37.5-25 MG capsule Take 1 each (1 capsule total) by mouth daily. 30 capsule 0   No current facility-administered medications for this visit.     OBJECTIVE: Older white woman who appears younger than stated age  72:   01/18/18 1130  BP: (!) 150/65  Pulse: 81  Resp: 18  Temp: 98.4 F (36.9 C)  SpO2: 100%     Body mass index is 35.9 kg/m.   Wt Readings from Last 3 Encounters:  01/18/18 225 lb 12.8 oz (102.4 kg)  01/11/18 227 lb 6.4 oz (103.1 kg)  12/21/17 225 lb 8 oz (102.3 kg)  ECOG FS:1 - Symptomatic but completely ambulatory   Sclerae unicteric, EOMs intact Oropharynx clear and moist No cervical or supraclavicular adenopathy Lungs no rales or rhonchi Heart regular rate and rhythm Abd soft, nontender, positive bowel sounds MSK no focal spinal tenderness, no upper extremity lymphedema Neuro: nonfocal, well oriented, appropriate affect Breasts: The right breast is unremarkable.  The left breast is considerably improved as compared to the photos below.  However I was not able to get epic to turn on so I could photograph it today.     Breast on 11/01/2017   Breast on 01/11/2018         LAB RESULTS:  CMP     Component Value Date/Time   NA 141 01/11/2018 1034   NA 142 05/12/2017 0808   K 3.4 (L) 01/11/2018 1034   K 3.5 05/12/2017 0808   CL 107 01/11/2018 1034   CO2 27 01/11/2018 1034   CO2 26 05/12/2017 0808   GLUCOSE 131 (H) 01/11/2018  1034   GLUCOSE 133 05/12/2017 0808   BUN 9 01/11/2018 1034   BUN 9.8 05/12/2017 0808   CREATININE 0.72 01/11/2018 1034   CREATININE 0.9 05/12/2017 0808   CALCIUM 9.1 01/11/2018 1034   CALCIUM 9.3 05/12/2017 0808  PROT 7.2 01/11/2018 1034   PROT 7.3 05/12/2017 0808   ALBUMIN 3.1 (L) 01/11/2018 1034   ALBUMIN 3.3 (L) 05/12/2017 0808   AST 29 01/11/2018 1034   AST 15 05/12/2017 0808   ALT 15 01/11/2018 1034   ALT 10 05/12/2017 0808   ALKPHOS 121 01/11/2018 1034   ALKPHOS 90 05/12/2017 0808   BILITOT 0.5 01/11/2018 1034   BILITOT 0.56 05/12/2017 0808   GFRNONAA >60 01/11/2018 1034   GFRAA >60 01/11/2018 1034    No results found for: Ronnald Ramp, A1GS, A2GS, BETS, BETA2SER, GAMS, MSPIKE, SPEI  No results found for: Nils Pyle, [facility]  Lab Results  Component Value Date   WBC 3.3 (L) 01/18/2018   NEUTROABS 1.2 (L) 01/18/2018   HGB 9.6 (L) 01/18/2018   HCT 30.3 (L) 01/18/2018   MCV 97.1 01/18/2018   PLT 208 01/18/2018      Chemistry      Component Value Date/Time   NA 141 01/11/2018 1034   NA 142 05/12/2017 0808   K 3.4 (L) 01/11/2018 1034   K 3.5 05/12/2017 0808   CL 107 01/11/2018 1034   CO2 27 01/11/2018 1034   CO2 26 05/12/2017 0808   BUN 9 01/11/2018 1034   BUN 9.8 05/12/2017 0808   CREATININE 0.72 01/11/2018 1034   CREATININE 0.9 05/12/2017 0808      Component Value Date/Time   CALCIUM 9.1 01/11/2018 1034   CALCIUM 9.3 05/12/2017 0808   ALKPHOS 121 01/11/2018 1034   ALKPHOS 90 05/12/2017 0808   AST 29 01/11/2018 1034   AST 15 05/12/2017 0808   ALT 15 01/11/2018 1034   ALT 10 05/12/2017 0808   BILITOT 0.5 01/11/2018 1034   BILITOT 0.56 05/12/2017 0808       No results found for: LABCA2  No components found for: UUVOZD664  No results for input(s): INR in the last 168 hours.  No results found for: LABCA2  No results found for: CAN199  No results found for: QIH474  No results found for: QVZ563  Lab Results   Component Value Date   CA2729 23.0 01/11/2018    No components found for: HGQUANT  No results found for: CEA1 / No results found for: CEA1   No results found for: AFPTUMOR  No results found for: [place]  No results found for: PSA1  Appointment on 01/18/2018  Component Date Value Ref Range Status  . WBC Count 01/18/2018 3.3* 3.9 - 10.3 K/uL Final  . RBC 01/18/2018 3.12* 3.70 - 5.45 MIL/uL Final  . Hemoglobin 01/18/2018 9.6* 11.6 - 15.9 g/dL Final  . HCT 01/18/2018 30.3* 34.8 - 46.6 % Final  . MCV 01/18/2018 97.1  79.5 - 101.0 fL Final  . MCH 01/18/2018 30.8  25.1 - 34.0 pg Final  . MCHC 01/18/2018 31.7  31.5 - 36.0 g/dL Final  . RDW 01/18/2018 18.6* 11.2 - 14.5 % Final  . Platelet Count 01/18/2018 208  145 - 400 K/uL Final  . Neutrophils Relative % 01/18/2018 36  % Final  . Neutro Abs 01/18/2018 1.2* 1.5 - 6.5 K/uL Final  . Lymphocytes Relative 01/18/2018 54  % Final  . Lymphs Abs 01/18/2018 1.8  0.9 - 3.3 K/uL Final  . Monocytes Relative 01/18/2018 8  % Final  . Monocytes Absolute 01/18/2018 0.3  0.1 - 0.9 K/uL Final  . Eosinophils Relative 01/18/2018 2  % Final  . Eosinophils Absolute 01/18/2018 0.1  0.0 - 0.5 K/uL Final  . Basophils Relative 01/18/2018 0  %  Final  . Basophils Absolute 01/18/2018 0.0  0.0 - 0.1 K/uL Final   Performed at [facility], [address] [place], [place] [address]    (this displays the last labs from the last 3 days)  No results found for: TOTALPROTELP, ALBUMINELP, A1GS, A2GS, BETS, BETA2SER, GAMS, MSPIKE, SPEI (this displays SPEP labs)  No results found for: KPAFRELGTCHN, LAMBDASER, KAPLAMBRATIO (kappa/lambda light chains)  No results found for: HGBA, HGBA2QUANT, HGBFQUANT, HGBSQUAN (Hemoglobinopathy evaluation)   No results found for: LDH  No results found for: IRON, TIBC, IRONPCTSAT (Iron and TIBC)  No results found for: FERRITIN  Urinalysis    Component Value Date/Time   COLORURINE YELLOW  08/10/2017 0924   APPEARANCEUR CLEAR 08/10/2017 0924   LABSPEC 1.010 08/10/2017 0924   PHURINE 6.5 08/10/2017 [place] 08/10/2017 0924   HGBUR NEGATIVE 08/10/2017 0924   BILIRUBINUR NEGATIVE 08/10/2017 0924   KETONESUR NEGATIVE 08/10/2017 0924   PROTEINUR NEGATIVE 08/10/2017 0924   NITRITE NEGATIVE 08/10/2017 0924   LEUKOCYTESUR SMALL (A) 08/10/2017 0924     STUDIES: Echocardiogram results discussed with the patient  ELIGIBLE FOR AVAILABLE RESEARCH PROTOCOL:no  ASSESSMENT: 72 y.o. [place] woman (primarily residing in [place])  (1) status post left lumpectomy in 2012 for a reported pT2 pN1 breast cancer, the patient refusing adjuvant treatment (no chemotherapy, radiation, or antiestrogens).  METASTATIC DISEASE: December 2018 (bilateral triple positive disease) (2) status post left breast biopsy 03/30/2017 for a clinical T4 N0, stage IIIB invasive ductal carcinoma, grade 3, triple positive, with an MIB-1 of 70%  (a) staging studies 05/21/2017 show multiple bilateral pulmonary nodules consistent with stage IV disease 05/21/2017; there are no liver or bone lesions noted  (b) breast MRI 05/13/2017 shows a 7 cm area of non-masslike enhancement in the right breast, with biopsy ([id number]) on 06/24/2017 showing: Metastatic carcinoma. Prognostic panel was strongly ER and PR positive as well as HER-2 positive with the signals ratio of 2.61 and number per cell 5.35 (triple positive)  (3) neoadjuvant chemotherapy with T-DM1 started 06/29/2016  (a) echocardiogram 06/24/2017 showed an ejection fraction of 60-65% range  (b) echocardiogram 09/23/2017 showed an ejection fraction of 65-70% range  (c) CT chest on 10/12/2017 shows progression in nodes, pulmonary nodules, and bone mets   (4) definitive surgery to follow once left chest wall disease under better control  (5) anastrozole written on 09/21/2017, to be started 02/13/2018  (a) patient notes inability to tolerate tamoxifen in  the past (nausea).  (6) genetics testing pending  (7) chemotherapy with carboplatin, gemcitabine, and trastuzumab started 11/01/2017, with Gemcitabine and Carboplatin given on day 1, and Gemcitabine, Carboplatin, and Trastuzumab given on day 8,repeated every 21 days x 4, or to optimal response  (a) carboplatin omitted from cycle 4 because of neuropathy  (8) trastuzumab to be continued indefinitely  (a) echocardiogram 01/11/2018 showed an ejection fraction in the 65-70% range  PLAN: Tracy Howell has done remarkably well with her chemotherapy.  There has been significant improvement in the left breast disease and I am hopeful she will be able to undergo a modified radical mastectomy on that side with adequate closure.  She may require plastic support  She did very well with the chemotherapy but now that she has developed neuropathy we are more limited.  I think this is a good time to switch to antiestrogens.  She was reluctant to try anastrozole in the past because of experiences with tamoxifen remotely, but she is now willing to start and I  think it is starting date is going to be 02/13/2018.  She will return to see me 5 or 6 weeks after starting anastrozole, and at that time we will consider adding palbociclib to her regimen  Hopefully she will have had her definitive surgery by her next visit with me  She knows to call for any other issues that may develop before the next visit.  Malakye Nolden, Virgie Dad, MD  01/18/18 11:50 AM Medical Oncology and Hematology [facility] [address] [place], [place] [address] Tel. [phone]    Fax. [phone]  Alice Rieger, am acting as scribe for Chauncey Cruel MD.  I, Lurline Del MD, have reviewed the above documentation for accuracy and completeness, and I agree with the above.     Addendum: The patient will receive trastuzumab 02/08/2018, 03/01/2018 and then 03/18/2018.  I am going to keep the dose stable at 6 mg/kg  even though the third of those doses is a bit early.

## 2018-01-18 ENCOUNTER — Telehealth: Payer: Self-pay | Admitting: Oncology

## 2018-01-18 ENCOUNTER — Inpatient Hospital Stay: Payer: Medicare Other

## 2018-01-18 ENCOUNTER — Inpatient Hospital Stay (HOSPITAL_BASED_OUTPATIENT_CLINIC_OR_DEPARTMENT_OTHER): Payer: Medicare Other | Admitting: Oncology

## 2018-01-18 ENCOUNTER — Inpatient Hospital Stay: Payer: Medicare Other | Attending: Oncology

## 2018-01-18 VITALS — BP 150/65 | HR 81 | Temp 98.4°F | Resp 18 | Ht 66.5 in | Wt 225.8 lb

## 2018-01-18 DIAGNOSIS — Z95828 Presence of other vascular implants and grafts: Secondary | ICD-10-CM

## 2018-01-18 DIAGNOSIS — Z170 Estrogen receptor positive status [ER+]: Secondary | ICD-10-CM | POA: Insufficient documentation

## 2018-01-18 DIAGNOSIS — C50912 Malignant neoplasm of unspecified site of left female breast: Secondary | ICD-10-CM | POA: Insufficient documentation

## 2018-01-18 DIAGNOSIS — C7951 Secondary malignant neoplasm of bone: Secondary | ICD-10-CM

## 2018-01-18 DIAGNOSIS — Z5111 Encounter for antineoplastic chemotherapy: Secondary | ICD-10-CM | POA: Diagnosis not present

## 2018-01-18 DIAGNOSIS — C50212 Malignant neoplasm of upper-inner quadrant of left female breast: Secondary | ICD-10-CM

## 2018-01-18 DIAGNOSIS — G620 Drug-induced polyneuropathy: Secondary | ICD-10-CM | POA: Insufficient documentation

## 2018-01-18 DIAGNOSIS — C7800 Secondary malignant neoplasm of unspecified lung: Secondary | ICD-10-CM | POA: Diagnosis not present

## 2018-01-18 DIAGNOSIS — Z9221 Personal history of antineoplastic chemotherapy: Secondary | ICD-10-CM | POA: Insufficient documentation

## 2018-01-18 LAB — CMP (CANCER CENTER ONLY)
ALT: 21 U/L (ref 0–44)
ANION GAP: 8 (ref 5–15)
AST: 30 U/L (ref 15–41)
Albumin: 3 g/dL — ABNORMAL LOW (ref 3.5–5.0)
Alkaline Phosphatase: 119 U/L (ref 38–126)
BUN: 7 mg/dL — AB (ref 8–23)
CO2: 28 mmol/L (ref 22–32)
Calcium: 9.2 mg/dL (ref 8.9–10.3)
Chloride: 105 mmol/L (ref 98–111)
Creatinine: 0.72 mg/dL (ref 0.44–1.00)
GFR, Est AFR Am: >60 mL/min (ref >60)
Glucose, Bld: 106 mg/dL — ABNORMAL HIGH (ref 70–99)
POTASSIUM: 3.9 mmol/L (ref 3.5–5.1)
Sodium: 141 mmol/L (ref 135–145)
TOTAL PROTEIN: 7.3 g/dL (ref 6.5–8.1)
Total Bilirubin: 0.4 mg/dL (ref 0.3–1.2)

## 2018-01-18 LAB — CBC WITH DIFFERENTIAL (CANCER CENTER ONLY)
Basophils Absolute: 0 10*3/uL (ref 0.0–0.1)
Basophils Relative: 0 %
EOS ABS: 0.1 10*3/uL (ref 0.0–0.5)
EOS PCT: 2 %
HCT: 30.3 % — ABNORMAL LOW (ref 34.8–46.6)
Hemoglobin: 9.6 g/dL — ABNORMAL LOW (ref 11.6–15.9)
LYMPHS PCT: 54 %
Lymphs Abs: 1.8 10*3/uL (ref 0.9–3.3)
MCH: 30.8 pg (ref 25.1–34.0)
MCHC: 31.7 g/dL (ref 31.5–36.0)
MCV: 97.1 fL (ref 79.5–101.0)
Monocytes Absolute: 0.3 10*3/uL (ref 0.1–0.9)
Monocytes Relative: 8 %
Neutro Abs: 1.2 10*3/uL — ABNORMAL LOW (ref 1.5–6.5)
Neutrophils Relative %: 36 %
Platelet Count: 208 10*3/uL (ref 145–400)
RBC: 3.12 MIL/uL — AB (ref 3.70–5.45)
RDW: 18.6 % — AB (ref 11.2–14.5)
WBC: 3.3 10*3/uL — AB (ref 3.9–10.3)

## 2018-01-18 MED ORDER — SODIUM CHLORIDE 0.9% FLUSH
10.0000 mL | Freq: Once | INTRAVENOUS | Status: AC
  Administered 2018-01-18: 10 mL
  Filled 2018-01-18: qty 10

## 2018-01-18 MED ORDER — SODIUM CHLORIDE 0.9 % IV SOLN
800.0000 mg/m2 | Freq: Once | INTRAVENOUS | Status: AC
  Administered 2018-01-18: 1786 mg via INTRAVENOUS
  Filled 2018-01-18: qty 46.97

## 2018-01-18 MED ORDER — SODIUM CHLORIDE 0.9 % IV SOLN
Freq: Once | INTRAVENOUS | Status: AC
  Administered 2018-01-18: 13:00:00 via INTRAVENOUS
  Filled 2018-01-18: qty 250

## 2018-01-18 MED ORDER — DEXAMETHASONE SODIUM PHOSPHATE 10 MG/ML IJ SOLN
10.0000 mg | Freq: Once | INTRAMUSCULAR | Status: AC
  Administered 2018-01-18: 10 mg via INTRAVENOUS

## 2018-01-18 MED ORDER — PALONOSETRON HCL INJECTION 0.25 MG/5ML
INTRAVENOUS | Status: AC
  Filled 2018-01-18: qty 5

## 2018-01-18 MED ORDER — HEPARIN SOD (PORK) LOCK FLUSH 100 UNIT/ML IV SOLN
500.0000 [IU] | Freq: Once | INTRAVENOUS | Status: AC | PRN
  Administered 2018-01-18: 500 [IU]
  Filled 2018-01-18: qty 5

## 2018-01-18 MED ORDER — PALONOSETRON HCL INJECTION 0.25 MG/5ML
0.2500 mg | Freq: Once | INTRAVENOUS | Status: AC
  Administered 2018-01-18: 0.25 mg via INTRAVENOUS

## 2018-01-18 MED ORDER — SODIUM CHLORIDE 0.9% FLUSH
10.0000 mL | INTRAVENOUS | Status: DC | PRN
  Administered 2018-01-18: 10 mL
  Filled 2018-01-18: qty 10

## 2018-01-18 MED ORDER — DEXAMETHASONE SODIUM PHOSPHATE 10 MG/ML IJ SOLN
INTRAMUSCULAR | Status: AC
  Filled 2018-01-18: qty 1

## 2018-01-18 NOTE — Progress Notes (Signed)
Okay to treat with ANC 1.2 per Dr. Jana Hakim.

## 2018-01-18 NOTE — Telephone Encounter (Signed)
Gave avs and calendar ° °

## 2018-01-18 NOTE — Patient Instructions (Signed)
[place] Discharge Instructions for Patients Receiving Chemotherapy  Today you received the following chemotherapy agents: Gemzar.  To help prevent nausea and vomiting after your treatment, we encourage you to take your nausea medication as directed. DO NOT TAKE ZOFRAN FOR THREE DAYS AFTER TREATMENT.   If you develop nausea and vomiting that is not controlled by your nausea medication, call the clinic.   BELOW ARE SYMPTOMS THAT SHOULD BE REPORTED IMMEDIATELY:  *FEVER GREATER THAN 100.5 F  *CHILLS WITH OR WITHOUT FEVER  NAUSEA AND VOMITING THAT IS NOT CONTROLLED WITH YOUR NAUSEA MEDICATION  *UNUSUAL SHORTNESS OF BREATH  *UNUSUAL BRUISING OR BLEEDING  TENDERNESS IN MOUTH AND THROAT WITH OR WITHOUT PRESENCE OF ULCERS  *URINARY PROBLEMS  *BOWEL PROBLEMS  UNUSUAL RASH Items with * indicate a potential emergency and should be followed up as soon as possible.  Feel free to call the clinic should you have any questions or concerns. The clinic phone number is [phone].  Please show the [place] at check-in to the Emergency Department and triage nurse.

## 2018-01-18 NOTE — Progress Notes (Signed)
Verified w/ MD - give both Aloxi and dexamethasone with gemcitabine.   Demetrius Charity, PharmD Oncology Pharmacist Pharmacy Phone: [phone] 01/18/2018

## 2018-01-19 ENCOUNTER — Telehealth: Payer: Self-pay | Admitting: Oncology

## 2018-01-19 NOTE — Telephone Encounter (Signed)
tried to reach regarding 8/20 cancellation

## 2018-02-01 ENCOUNTER — Other Ambulatory Visit: Payer: Medicare Other

## 2018-02-01 ENCOUNTER — Ambulatory Visit: Payer: Medicare Other

## 2018-02-02 ENCOUNTER — Other Ambulatory Visit: Payer: Self-pay | Admitting: Surgery

## 2018-02-02 DIAGNOSIS — C50912 Malignant neoplasm of unspecified site of left female breast: Secondary | ICD-10-CM | POA: Diagnosis not present

## 2018-02-07 ENCOUNTER — Other Ambulatory Visit: Payer: Self-pay | Admitting: *Deleted

## 2018-02-07 DIAGNOSIS — Z170 Estrogen receptor positive status [ER+]: Secondary | ICD-10-CM | POA: Diagnosis not present

## 2018-02-07 DIAGNOSIS — C50912 Malignant neoplasm of unspecified site of left female breast: Secondary | ICD-10-CM | POA: Diagnosis not present

## 2018-02-07 DIAGNOSIS — C7802 Secondary malignant neoplasm of left lung: Secondary | ICD-10-CM | POA: Diagnosis not present

## 2018-02-08 ENCOUNTER — Inpatient Hospital Stay: Payer: Medicare Other

## 2018-02-08 ENCOUNTER — Telehealth: Payer: Self-pay | Admitting: *Deleted

## 2018-02-08 ENCOUNTER — Ambulatory Visit: Payer: Medicare Other

## 2018-02-08 ENCOUNTER — Telehealth: Payer: Self-pay | Admitting: Oncology

## 2018-02-08 NOTE — Telephone Encounter (Signed)
This RN will review appointments with MD- note this RN received communication yesterday from scheduling stating pt was inquiring about appointment today- scheduling was to call the patient stating appointment was needed due to chemo has been completed but pt will be continuing with antibody therapy- herceptin.  Per MD review- pt is to continue antibody therapy indefinitely.  This RN called pt to clarify above and obtained VM - message left to return call.

## 2018-02-08 NOTE — Telephone Encounter (Signed)
Per 8/26 sch message - left message for patient that appt is needed for today .

## 2018-02-08 NOTE — Telephone Encounter (Signed)
TC from patient regarding appointment schedule. Per Dr. Virgie Dad note on 01/18/18, pt was not to return to see him until after she started her anastrozole on 02/13/18. She has an appointment on 03/01/18 which she will come to..Apparently she received a call regarding an appointment for today, which should have been cancelled. Pt does not want to be listed as a no show as that appt should have been cancelled.  Pt has seen a Psychologist, sport and exercise and plastic surgeon for her mastectomy. She is expecting a call from them this week for a date for her surgery.  She is anticipating that the surgery will occur prior to her appt on 03/01/18 with Dr. Doris Cheadle

## 2018-02-09 ENCOUNTER — Telehealth: Payer: Self-pay

## 2018-02-09 NOTE — Telephone Encounter (Signed)
Spoke with pt this morning regarding missing her herceptin appt yesterday 02/08/18. Pt was under the impression that she has completed her herceptin treatments and will no longer be needing anymore. Per Dr.Magrinat's note, pt to receive on 8/27, 9/17 and 10/4 this year. Pt contacted her surgeon and still waiting on date of surgery. At this time, pt is unable to make up missed herceptin appt because she is currently in [place] with family. She will keep her 9/17 appt. And confirmed this over the phone.   Told pt to call our office to notify us of date of surgery. Pt verbalized understanding and will call when needed.

## 2018-02-23 ENCOUNTER — Other Ambulatory Visit: Payer: Self-pay | Admitting: Oncology

## 2018-03-01 ENCOUNTER — Inpatient Hospital Stay: Payer: Medicare Other

## 2018-03-01 ENCOUNTER — Telehealth: Payer: Self-pay | Admitting: *Deleted

## 2018-03-01 ENCOUNTER — Inpatient Hospital Stay: Payer: Medicare Other | Attending: Oncology

## 2018-03-01 VITALS — BP 151/64 | HR 83 | Temp 98.1°F | Resp 17 | Ht 66.5 in

## 2018-03-01 DIAGNOSIS — Z5112 Encounter for antineoplastic immunotherapy: Secondary | ICD-10-CM | POA: Diagnosis not present

## 2018-03-01 DIAGNOSIS — C50212 Malignant neoplasm of upper-inner quadrant of left female breast: Secondary | ICD-10-CM | POA: Diagnosis not present

## 2018-03-01 DIAGNOSIS — Z170 Estrogen receptor positive status [ER+]: Secondary | ICD-10-CM | POA: Insufficient documentation

## 2018-03-01 DIAGNOSIS — Z95828 Presence of other vascular implants and grafts: Secondary | ICD-10-CM

## 2018-03-01 LAB — CMP (CANCER CENTER ONLY)
ALBUMIN: 3.3 g/dL — AB (ref 3.5–5.0)
ALT: 17 U/L (ref 0–44)
ANION GAP: 11 (ref 5–15)
AST: 32 U/L (ref 15–41)
Alkaline Phosphatase: 110 U/L (ref 38–126)
BUN: 9 mg/dL (ref 8–23)
CO2: 26 mmol/L (ref 22–32)
Calcium: 9.4 mg/dL (ref 8.9–10.3)
Chloride: 106 mmol/L (ref 98–111)
Creatinine: 0.75 mg/dL (ref 0.44–1.00)
GFR, Est AFR Am: >60 mL/min (ref >60)
GFR, Estimated: >60 mL/min (ref >60)
GLUCOSE: 114 mg/dL — AB (ref 70–99)
POTASSIUM: 3.6 mmol/L (ref 3.5–5.1)
SODIUM: 143 mmol/L (ref 135–145)
Total Bilirubin: 0.5 mg/dL (ref 0.3–1.2)
Total Protein: 7.5 g/dL (ref 6.5–8.1)

## 2018-03-01 LAB — CBC WITH DIFFERENTIAL (CANCER CENTER ONLY)
BASOS ABS: 0 10*3/uL (ref 0.0–0.1)
Basophils Relative: 1 %
Eosinophils Absolute: 0.3 10*3/uL (ref 0.0–0.5)
Eosinophils Relative: 5 %
HEMATOCRIT: 35.7 % (ref 34.8–46.6)
HEMOGLOBIN: 11.6 g/dL (ref 11.6–15.9)
LYMPHS PCT: 37 %
Lymphs Abs: 2.1 10*3/uL (ref 0.9–3.3)
MCH: 31.5 pg (ref 25.1–34.0)
MCHC: 32.5 g/dL (ref 31.5–36.0)
MCV: 97 fL (ref 79.5–101.0)
MONO ABS: 0.4 10*3/uL (ref 0.1–0.9)
MONOS PCT: 7 %
NEUTROS ABS: 2.9 10*3/uL (ref 1.5–6.5)
Neutrophils Relative %: 50 %
Platelet Count: 164 10*3/uL (ref 145–400)
RBC: 3.68 MIL/uL — ABNORMAL LOW (ref 3.70–5.45)
RDW: 14.9 % — AB (ref 11.2–14.5)
WBC Count: 5.7 10*3/uL (ref 3.9–10.3)

## 2018-03-01 MED ORDER — TRASTUZUMAB CHEMO 150 MG IV SOLR
600.0000 mg | Freq: Once | INTRAVENOUS | Status: AC
  Administered 2018-03-01: 600 mg via INTRAVENOUS
  Filled 2018-03-01: qty 28.57

## 2018-03-01 MED ORDER — SODIUM CHLORIDE 0.9 % IV SOLN
Freq: Once | INTRAVENOUS | Status: AC
  Administered 2018-03-01: 09:00:00 via INTRAVENOUS
  Filled 2018-03-01: qty 250

## 2018-03-01 MED ORDER — ACETAMINOPHEN 325 MG PO TABS
650.0000 mg | ORAL_TABLET | Freq: Once | ORAL | Status: AC
  Administered 2018-03-01: 650 mg via ORAL

## 2018-03-01 MED ORDER — DIPHENHYDRAMINE HCL 25 MG PO CAPS
25.0000 mg | ORAL_CAPSULE | Freq: Once | ORAL | Status: AC
  Administered 2018-03-01: 25 mg via ORAL

## 2018-03-01 MED ORDER — SODIUM CHLORIDE 0.9% FLUSH
10.0000 mL | INTRAVENOUS | Status: DC | PRN
  Administered 2018-03-01: 10 mL
  Filled 2018-03-01: qty 10

## 2018-03-01 MED ORDER — DIPHENHYDRAMINE HCL 25 MG PO CAPS
ORAL_CAPSULE | ORAL | Status: AC
  Filled 2018-03-01: qty 1

## 2018-03-01 MED ORDER — ACETAMINOPHEN 325 MG PO TABS
ORAL_TABLET | ORAL | Status: AC
  Filled 2018-03-01: qty 2

## 2018-03-01 MED ORDER — HEPARIN SOD (PORK) LOCK FLUSH 100 UNIT/ML IV SOLN
500.0000 [IU] | Freq: Once | INTRAVENOUS | Status: AC | PRN
  Administered 2018-03-01: 500 [IU]
  Filled 2018-03-01: qty 5

## 2018-03-01 MED ORDER — SODIUM CHLORIDE 0.9% FLUSH
10.0000 mL | Freq: Once | INTRAVENOUS | Status: AC
  Administered 2018-03-01: 10 mL
  Filled 2018-03-01: qty 10

## 2018-03-01 NOTE — Progress Notes (Signed)
Spoke w/ Val, no Herceptin re-load per MD in setting of missing last infusion.   Demetrius Charity, PharmD Oncology Pharmacist Pharmacy Phone: [phone] 03/01/2018

## 2018-03-01 NOTE — Telephone Encounter (Signed)
This RN spoke with pt today per her visit for herceptin due to her calls that this RN was unable to reach her ( telephone tag ) per her concern due to side effects of anastrazole including - severe hot flashes and joint pain interfering with ADL's.  Tracy Howell - stopped the anastrazole approximately 1 week ago due to above side effects - with symptoms now resolving.  Pt is scheduled for surgery on 03/21/2018.  Per above and review with covering provider - this RN informed pt to continue off the anastrazole at this time.  Issues will be discussed at next MD visit on 10/4 for other treatment options.  Tracy Howell verbalized understanding.  No further needs at this time.

## 2018-03-01 NOTE — Patient Instructions (Signed)
[facility] Discharge Instructions for Patients Receiving Chemotherapy  Today you received the following chemotherapy agents Herceptin  To help prevent nausea and vomiting after your treatment, we encourage you to take your nausea medication as directed   If you develop nausea and vomiting that is not controlled by your nausea medication, call the clinic.   BELOW ARE SYMPTOMS THAT SHOULD BE REPORTED IMMEDIATELY:  *FEVER GREATER THAN 100.5 F  *CHILLS WITH OR WITHOUT FEVER  NAUSEA AND VOMITING THAT IS NOT CONTROLLED WITH YOUR NAUSEA MEDICATION  *UNUSUAL SHORTNESS OF BREATH  *UNUSUAL BRUISING OR BLEEDING  TENDERNESS IN MOUTH AND THROAT WITH OR WITHOUT PRESENCE OF ULCERS  *URINARY PROBLEMS  *BOWEL PROBLEMS  UNUSUAL RASH Items with * indicate a potential emergency and should be followed up as soon as possible.  Feel free to call the clinic should you have any questions or concerns. The clinic phone number is [phone].  Please show the CHEMO ALERT CARD at check-in to the Emergency Department and triage nurse.   

## 2018-03-02 LAB — CANCER ANTIGEN 27.29: CAN 27.29: 18.4 U/mL (ref 0.0–38.6)

## 2018-03-15 ENCOUNTER — Ambulatory Visit: Payer: Self-pay | Admitting: Surgery

## 2018-03-15 ENCOUNTER — Other Ambulatory Visit (HOSPITAL_COMMUNITY): Payer: Medicare Other

## 2018-03-16 ENCOUNTER — Telehealth: Payer: Self-pay

## 2018-03-16 DIAGNOSIS — L089 Local infection of the skin and subcutaneous tissue, unspecified: Secondary | ICD-10-CM | POA: Diagnosis not present

## 2018-03-16 DIAGNOSIS — L723 Sebaceous cyst: Secondary | ICD-10-CM | POA: Diagnosis not present

## 2018-03-16 DIAGNOSIS — C50912 Malignant neoplasm of unspecified site of left female breast: Secondary | ICD-10-CM | POA: Diagnosis not present

## 2018-03-16 NOTE — Telephone Encounter (Signed)
Pt called and lvm regarding having a boil on her abdomen. She is scheduled for mastectomy surgery on 10/7 and would like to know who she needs to see or if she needs to take any antibiotic prior to surgery. Called pt back and she has an appt with the surgeon's office this afternoon to get boil evaluated. Pt thankful for the call.

## 2018-03-16 NOTE — Pre-Procedure Instructions (Signed)
Loyce Dys  03/16/2018      [id number] - [place], [place] - [address] AT [address] [place] [address] Phone: [phone] Fax: [phone]    Your procedure is scheduled on 03/21/18.  Report to [facility] Admitting at 530 A.M.  Call this number if you have problems the morning of surgery:  [phone]   Remember:  Do not eat or drink after midnight.  Y Take these medicines the morning of surgery with A SIP OF WATER ----none    Do not wear jewelry, make-up or nail polish.  Do not wear lotions, powders, or perfumes, or deodorant.  Do not shave 48 hours prior to surgery.  Men may shave face and neck.  Do not bring valuables to the hospital.  [facility] is not responsible for any belongings or valuables.  Contacts, dentures or bridgework may not be worn into surgery.  Leave your suitcase in the car.  After surgery it may be brought to your room.  For patients admitted to the hospital, discharge time will be determined by your treatment team.  Patients discharged the day of surgery will not be allowed to drive home.   Name and phone number of your driver:   Do not take any aspirin,anti-inflammatories,vitamins,or herbal supplements 5-7 days prior to surgery. Special instructions: [place] - Preparing for Surgery  Before surgery, you can play an important role.  Because skin is not sterile, your skin needs to be as free of germs as possible.  You can reduce the number of germs on you skin by washing with CHG (chlorahexidine gluconate) soap before surgery.  CHG is an antiseptic cleaner which kills germs and bonds with the skin to continue killing germs even after washing.  Oral Hygiene is also important in reducing the risk of infection.  Remember to brush your teeth with your regular toothpaste the morning of surgery.  Please DO NOT use if you have an allergy to CHG or antibacterial soaps.   If your skin becomes reddened/irritated stop using the CHG and inform your nurse when you arrive at Short Stay.  Do not shave (including legs and underarms) for at least 48 hours prior to the first CHG shower.  You may shave your face.  Please follow these instructions carefully:   1.  Shower with CHG Soap the night before surgery and the morning of Surgery.  2.  If you choose to wash your hair, wash your hair first as usual with your normal shampoo.  3.  After you shampoo, rinse your hair and body thoroughly to remove the shampoo. 4.  Use CHG as you would any other liquid soap.  You can apply chg directly to the skin and wash gently with a      scrungie or washcloth.           5.  Apply the CHG Soap to your body ONLY FROM THE NECK DOWN.   Do not use on open wounds or open sores. Avoid contact with your eyes, ears, mouth and genitals (private parts).  Wash genitals (private parts) with your normal soap.  6.  Wash thoroughly, paying special attention to the area where your surgery will be performed.  7.  Thoroughly rinse your body with warm water from the neck down.  8.  DO NOT shower/wash with your normal soap after using and rinsing off the CHG Soap.  9.  Pat yourself dry with a clean towel.            10.  Wear clean pajamas.            11.  Place clean sheets on your bed the night of your first shower and do not sleep with pets.  Day of Surgery  Do not apply any lotions/deoderants the morning of surgery.   Please wear clean clothes to the hospital/surgery center. Remember to brush your teeth with toothpaste.    Please read over the following fact sheets that you were given.

## 2018-03-17 ENCOUNTER — Encounter (HOSPITAL_COMMUNITY)
Admission: RE | Admit: 2018-03-17 | Discharge: 2018-03-17 | Disposition: A | Discharge status: Home / Self Care | Payer: Medicare Other | Source: Ambulatory Visit | Attending: Surgery | Admitting: Surgery

## 2018-03-17 ENCOUNTER — Encounter (HOSPITAL_COMMUNITY): Payer: Self-pay

## 2018-03-17 DIAGNOSIS — Z01812 Encounter for preprocedural laboratory examination: Secondary | ICD-10-CM | POA: Insufficient documentation

## 2018-03-17 HISTORY — DX: Anxiety disorder, unspecified: F41.9

## 2018-03-17 LAB — CBC WITH DIFFERENTIAL/PLATELET
Abs Immature Granulocytes: 0 10*3/uL (ref 0.0–0.1)
BASOS ABS: 0 10*3/uL (ref 0.0–0.1)
BASOS PCT: 0 %
EOS ABS: 0.3 10*3/uL (ref 0.0–0.7)
Eosinophils Relative: 4 %
HCT: 36.8 % (ref 36.0–46.0)
Hemoglobin: 11.6 g/dL — ABNORMAL LOW (ref 12.0–15.0)
Immature Granulocytes: 0 %
Lymphocytes Relative: 31 %
Lymphs Abs: 2.1 10*3/uL (ref 0.7–4.0)
MCH: 30.9 pg (ref 26.0–34.0)
MCHC: 31.5 g/dL (ref 30.0–36.0)
MCV: 98.1 fL (ref 78.0–100.0)
Monocytes Absolute: 0.7 10*3/uL (ref 0.1–1.0)
Monocytes Relative: 10 %
Neutro Abs: 3.8 10*3/uL (ref 1.7–7.7)
Neutrophils Relative %: 55 %
PLATELETS: 234 10*3/uL (ref 150–400)
RBC: 3.75 MIL/uL — ABNORMAL LOW (ref 3.87–5.11)
RDW: 13.8 % (ref 11.5–15.5)
WBC: 7 10*3/uL (ref 4.0–10.5)

## 2018-03-17 LAB — BASIC METABOLIC PANEL
Anion gap: 8 (ref 5–15)
BUN: 9 mg/dL (ref 8–23)
CALCIUM: 9.2 mg/dL (ref 8.9–10.3)
CO2: 26 mmol/L (ref 22–32)
CREATININE: 0.64 mg/dL (ref 0.44–1.00)
Chloride: 105 mmol/L (ref 98–111)
Glucose, Bld: 90 mg/dL (ref 70–99)
Potassium: 3.9 mmol/L (ref 3.5–5.1)
SODIUM: 139 mmol/L (ref 135–145)

## 2018-03-17 MED ORDER — CHLORHEXIDINE GLUCONATE CLOTH 2 % EX PADS: 6.0000 | MEDICATED_PAD | Freq: Once | CUTANEOUS | Status: DC

## 2018-03-17 NOTE — Progress Notes (Signed)
[name]  [phone] [phone]     ID: Tracy Howell DOB: [birth year]  MR#: [id number]  VEH#:[id number]  Patient Care Team: Lucianne Lei, MD as PCP - General (Family Medicine) Alphonsa Overall, MD as Consulting Physician (General Surgery) Greyson Peavy, Virgie Dad, MD as Consulting Physician (Oncology) Eppie Gibson, MD as Attending Physician (Radiation Oncology) Juanita Craver, MD as Consulting Physician (Gastroenterology) Larey Dresser, MD as Consulting Physician (Cardiology) OTHER MD:  CHIEF COMPLAINT: triple positive bilateral breast cancer  CURRENT TREATMENT:  Trastuzumab; awaiting definitive surgery   INTERVAL HISTORY: Tracy Howell returns today for follow-up and treatment of her triple positive bilateral breast cancer accompanied by her husband. She receives trastuzumab every 21 days, with a dose due today. She tolerates this well. She denies issues with nausea, diarrhea, or fatigue.   She discontinued taking anastrozole. She notes that she has some vision blurriness, but this resolved after discontinuing.   She underwent biopsy of the breast skin on 02/02/2018 showing [phone]) showing: invasive ductal carcinoma involving the dermis. Repeat prognostic panel showing estrogen receptor 80% positive with moderate staining intensity, progesterone receptor 40% positive with strong-moderate staining intensity. Ki67 at 15%. HER2 positive with an IHC of (3+).   She is scheduled for left mastectomy and axillary lymph node sampling on 03/21/2018.   REVIEW OF SYSTEMS: Tracy Howell reports that she is doing well. She notes that she had an abscess on her abdomen with excision and drainage on 03/17/2018 under Dr. Lucia Gaskins. She is planning to have reconstruction under Dr. Iran Planas. On the right hand, she has numbness in her thumb and pinky finger. She denies unusual headaches, visual changes, nausea, vomiting, or dizziness. There has been no  unusual cough, phlegm production, or pleurisy. There has been no change in bowel or bladder habits. She denies unexplained fatigue or unexplained weight loss, bleeding, rash, or fever. A detailed review of systems was otherwise stable.    HISTORY OF CURRENT ILLNESS: From the original intake note:  "Tracy Howell" tells me she underwent left lumpectomy in [place] on 2012 for a 2.5 cm, grade 2 breast cancer involving one lymph node of 5 sampled. [ It may have been only 2 lymph nodes that were removed she says.]  This was at the [facility], currently the [place] [facility] on [address].  The patient says she was told could have more treatment if she wanted it.  She did not see any sense in it.  She feels her left breast cancer was caused by the fact that she kept her iPhone in her bra right by the left breast.  Sometime in April 2018 she noted a new mass in the left breast. She did not immediately bring it to medical attention but as it continued to grow she mentioned her to her primary care physician and on 03/29/2017 Tracy Howell underwent bilateral diagnostic mammography with tomography and left breast ultrasonography at [facility].  The breast density was category B.  In the left breast central to the nipple there was a 6 cm irregular mass with indistinct margins and heterogeneous calcifications.  By ultrasound this measured 5 cm, with indistinct margins, at the 12:00 anterior area.  There was associated edema.  The left axilla was sonographically benign.  Biopsy of the left breast area in question March 30, 2017 showed ([id number]) invasive ductal carcinoma, grade 3, estrogen receptor 100% positive, progesterone receptor 60% positive, both with strong staining intensity, HER-2 amplified, with a signals ratio of  3.38, and the number per cell 7.95.  The  MIB-1 was 70%.  The patient's subsequent history is as detailed below.    PAST MEDICAL HISTORY: Past  Medical History:  Diagnosis Date  . Angioedema   . Anxiety   . Breast cancer ([place]) 2013  . Complication of anesthesia    slow to wake up   . Pneumonia    hx of x 2   . recurrent left breast ca dx'd 04/2017  . SBO (small bowel obstruction) ([place]) 08/13/2014    PAST SURGICAL HISTORY: Past Surgical History:  Procedure Laterality Date  . ABDOMINAL HYSTERECTOMY    . APPENDECTOMY    . BREAST LUMPECTOMY  2013  . BREAST SURGERY    . CESAREAN SECTION     x2  . CHOLECYSTECTOMY    . CYST EXCISION     on abdomen  . PORTACATH PLACEMENT Right 05/17/2017   Procedure: INSERTION PORT-A-CATH right subclavian vein;  Surgeon: Alphonsa Overall, MD;  Location: [facility];  Service: General;  Laterality: Right;    FAMILY HISTORY Family History  Problem Relation Age of Onset  . Angioedema Mother   . Breast cancer Mother   . Breast cancer Maternal Aunt   The patient was adopted.  Her adoptive parents died in their 22s.  The patient's birth mother however died in her 72s from breast cancer.  The patient believes she has 2 brothers is not sure about sisters.  However she does have 2 maternal aunts who had breast cancer.  There is no history of ovarian cancer in the family to her knowledge.  The patient has no information regarding her father or his side of the family.   GYNECOLOGIC HISTORY:  No LMP recorded. Patient has had a hysterectomy. Menarche age 1, first live birth age 80, she is [place] P2.  She underwent hysterectomy at age 49.  She tells me they left a fourth of an ovary at that time.  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  She actually lives in [place], [place], and spends 2 weeks here every 2 months at her job.  When she is in town she actually lives with Dr. Criss Rosales. 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  She recently married [name] who is a Camera operator.  He has 2 children of his own, in [place] and [place].  The patient's own children are [name] who lives in [place] and works in Engineer, technical sales, and [employer] lives in [place] and owns a [place].  The patient has no grandchildren.  She is 1/7-day [facility].  (She tells me her religion has no bands on any medical treatments but she cannot have alcohol, cigarettes, or pork).   ADVANCED DIRECTIVES:    HEALTH MAINTENANCE: Social History   Tobacco Use  . Smoking status: Never Smoker  . Smokeless tobacco: Never Used  Substance Use Topics  . Alcohol use: No  . Drug use: No     Colonoscopy:  PAP:  Bone density:   Allergies  Allergen Reactions  . Onion Anaphylaxis, Nausea And Vomiting and Swelling    Throat swelling.  . Other Anaphylaxis and Nausea And Vomiting    Green Peppers-anaphylactic, nausea/vomiting/swelling  Mushrooms-nausea/vomiting.  Patient has hereditary angioedema (HAE)  . Adhesive [Tape]  Bruises skin   . Aleve [Naproxen] Other (See Comments)    bleeding  . Anastrozole     Talking out of my head, hot flashes, body felt weird   . Codeine Nausea And Vomiting  . Morphine And Related Nausea And Vomiting  . Neulasta [Pegfilgrastim]     Muscle and bone pain  . Penicillins Rash    Vomiting Has patient had a PCN reaction causing immediate rash, facial/tongue/throat swelling, SOB or lightheadedness with hypotension: Yes Has patient had a PCN reaction causing severe rash involving mucus membranes or skin necrosis: Yes Has patient had a PCN reaction that required hospitalization:Was inpatient when reaction occurred. Has patient had a PCN reaction occurring within the last 10 years: No If all of the above answers are "NO", then may proceed with Cephalosporin use.     Current Outpatient Medications  Medication Sig Dispense Refill  . Ascorbic Acid (VITAMIN C PO) Take 1 tablet by  mouth daily as needed (immune support).     . B Complex-C (B-COMPLEX WITH VITAMIN C) tablet Take 1 tablet by mouth daily as needed (immune support).     . cholecalciferol (VITAMIN D) 1000 units tablet Take 1,000 Units by mouth at bedtime.    . COD LIVER OIL PO Take 1 capsule by mouth daily as needed (immune support).     . LORazepam (ATIVAN) 0.5 MG tablet Take 1 tablet (0.5 mg total) by mouth at bedtime as needed for anxiety. 20 tablet 0  . Multiple Vitamin (MULTIVITAMIN WITH MINERALS) TABS tablet Take 1 tablet by mouth at bedtime.     . triamterene-hydrochlorothiazide (DYAZIDE) 37.5-25 MG capsule Take 1 each (1 capsule total) by mouth daily. (Patient taking differently: Take 1 capsule by mouth daily as needed (anxiety induced hypertension). ) 30 capsule 0   No current facility-administered medications for this visit.     OBJECTIVE: African-American woman who appears stated age  30:   03/18/18 0855  BP: (!) 165/67  Pulse: 95  Resp: 18  Temp: 97.9 F (36.6 C)  SpO2: 98%     Body mass index is 36.19 kg/m.   Wt Readings from Last 3 Encounters:  03/18/18 227 lb 9.6 oz (103.2 kg)  03/17/18 225 lb 9.6 oz (102.3 kg)  01/18/18 225 lb 12.8 oz (102.4 kg)  ECOG FS:1 - Symptomatic but completely ambulatory   Sclerae unicteric, pupils round and equal No cervical or supraclavicular adenopathy Lungs no rales or rhonchi Heart regular rate and rhythm Abd soft, nontender, positive bowel sounds; mid abdomen marsupialized abscess bandaged over MSK no focal spinal tenderness, no upper extremity lymphedema Neuro: nonfocal, well oriented, appropriate affect Breasts: The right breast is benign.  The left breast is considerably improved.  Is imaged below.   Breast on 11/01/2017   Breast on 01/11/2018    Left breast 03/18/2018       LAB RESULTS:  CMP     Component Value Date/Time   NA 143 03/18/2018 0802   NA 142 05/12/2017 0808   K 3.5 03/18/2018 0802   K 3.5 05/12/2017 0808    CL 104 03/18/2018 0802   CO2 28 03/18/2018 0802   CO2 26 05/12/2017 0808   GLUCOSE 103 (H) 03/18/2018 0802   GLUCOSE 133 05/12/2017 0808   BUN 9 03/18/2018 0802   BUN 9.8 05/12/2017 0808   CREATININE 0.76 03/18/2018 0802   CREATININE 0.9 05/12/2017 0808   CALCIUM 9.7 03/18/2018 0802   CALCIUM 9.3 05/12/2017 0808   PROT 7.7 03/18/2018 0802  PROT 7.3 05/12/2017 0808   ALBUMIN 3.2 (L) 03/18/2018 0802   ALBUMIN 3.3 (L) 05/12/2017 0808   AST 35 03/18/2018 0802   AST 15 05/12/2017 0808   ALT 22 03/18/2018 0802   ALT 10 05/12/2017 0808   ALKPHOS 132 (H) 03/18/2018 0802   ALKPHOS 90 05/12/2017 0808   BILITOT 0.4 03/18/2018 0802   BILITOT 0.56 05/12/2017 0808   GFRNONAA >60 03/18/2018 0802   GFRAA >60 03/18/2018 0802    No results found for: Ronnald Ramp, A1GS, A2GS, BETS, BETA2SER, GAMS, MSPIKE, SPEI  No results found for: Nils Pyle, [facility]  Lab Results  Component Value Date   WBC 4.5 03/18/2018   NEUTROABS 2.3 03/18/2018   HGB 11.4 (L) 03/18/2018   HCT 34.8 03/18/2018   MCV 95.1 03/18/2018   PLT 230 03/18/2018      Chemistry      Component Value Date/Time   NA 143 03/18/2018 0802   NA 142 05/12/2017 0808   K 3.5 03/18/2018 0802   K 3.5 05/12/2017 0808   CL 104 03/18/2018 0802   CO2 28 03/18/2018 0802   CO2 26 05/12/2017 0808   BUN 9 03/18/2018 0802   BUN 9.8 05/12/2017 0808   CREATININE 0.76 03/18/2018 0802   CREATININE 0.9 05/12/2017 0808      Component Value Date/Time   CALCIUM 9.7 03/18/2018 0802   CALCIUM 9.3 05/12/2017 0808   ALKPHOS 132 (H) 03/18/2018 0802   ALKPHOS 90 05/12/2017 0808   AST 35 03/18/2018 0802   AST 15 05/12/2017 0808   ALT 22 03/18/2018 0802   ALT 10 05/12/2017 0808   BILITOT 0.4 03/18/2018 0802   BILITOT 0.56 05/12/2017 0808       No results found for: LABCA2  No components found for: [id number]  No results for input(s): INR in the last 168 hours.  No results found for: LABCA2  No results  found for: CAN199  No results found for: TIW580  No results found for: DXI338  Lab Results  Component Value Date   CA2729 18.4 03/01/2018    No components found for: HGQUANT  No results found for: CEA1 / No results found for: CEA1   No results found for: AFPTUMOR  No results found for: CHROMOGRNA  No results found for: PSA1  Appointment on 03/18/2018  Component Date Value Ref Range Status  . Sodium 03/18/2018 143  135 - 145 mmol/L Final  . Potassium 03/18/2018 3.5  3.5 - 5.1 mmol/L Final  . Chloride 03/18/2018 104  98 - 111 mmol/L Final  . CO2 03/18/2018 28  22 - 32 mmol/L Final  . Glucose, Bld 03/18/2018 103* 70 - 99 mg/dL Final  . BUN 03/18/2018 9  8 - 23 mg/dL Final  . Creatinine 03/18/2018 0.76  0.44 - 1.00 mg/dL Final  . Calcium 03/18/2018 9.7  8.9 - 10.3 mg/dL Final  . Total Protein 03/18/2018 7.7  6.5 - 8.1 g/dL Final  . Albumin 03/18/2018 3.2* 3.5 - 5.0 g/dL Final  . AST 03/18/2018 35  15 - 41 U/L Final  . ALT 03/18/2018 22  0 - 44 U/L Final  . Alkaline Phosphatase 03/18/2018 132* 38 - 126 U/L Final  . Total Bilirubin 03/18/2018 0.4  0.3 - 1.2 mg/dL Final  . GFR, Est Non Af Am 03/18/2018 >60  >60 mL/min Final  . GFR, Est AFR Am 03/18/2018 >60  >60 mL/min Final   Comment: (NOTE) The eGFR has been calculated using the CKD EPI equation. This  calculation has not been validated in all clinical situations. eGFR's persistently <60 mL/min signify possible Chronic Kidney Disease.   Georgiann Hahn gap 03/18/2018 11  5 - 15 Final   Performed at [facility], [place] [address], [place], [place] [address]  . WBC Count 03/18/2018 4.5  3.9 - 10.3 K/uL Final  . RBC 03/18/2018 3.66* 3.70 - 5.45 MIL/uL Final  . Hemoglobin 03/18/2018 11.4* 11.6 - 15.9 g/dL Final  . HCT 03/18/2018 34.8  34.8 - 46.6 % Final  . MCV 03/18/2018 95.1  79.5 - 101.0 fL Final  . MCH 03/18/2018 31.2  25.1 - 34.0 pg Final  . MCHC 03/18/2018 32.8  31.5 - 36.0 g/dL Final  . RDW 03/18/2018  14.7* 11.2 - 14.5 % Final  . Platelet Count 03/18/2018 230  145 - 400 K/uL Final  . Neutrophils Relative % 03/18/2018 50  % Final  . Neutro Abs 03/18/2018 2.3  1.5 - 6.5 K/uL Final  . Lymphocytes Relative 03/18/2018 34  % Final  . Lymphs Abs 03/18/2018 1.5  0.9 - 3.3 K/uL Final  . Monocytes Relative 03/18/2018 9  % Final  . Monocytes Absolute 03/18/2018 0.4  0.1 - 0.9 K/uL Final  . Eosinophils Relative 03/18/2018 6  % Final  . Eosinophils Absolute 03/18/2018 0.3  0.0 - 0.5 K/uL Final  . Basophils Relative 03/18/2018 1  % Final  . Basophils Absolute 03/18/2018 0.0  0.0 - 0.1 K/uL Final   Performed at [facility], [place] [address], [place], [place] [address]  Hospital Outpatient Visit on 03/17/2018  Component Date Value Ref Range Status  . Sodium 03/17/2018 139  135 - 145 mmol/L Final  . Potassium 03/17/2018 3.9  3.5 - 5.1 mmol/L Final  . Chloride 03/17/2018 105  98 - 111 mmol/L Final  . CO2 03/17/2018 26  22 - 32 mmol/L Final  . Glucose, Bld 03/17/2018 90  70 - 99 mg/dL Final  . BUN 03/17/2018 9  8 - 23 mg/dL Final  . Creatinine, Ser 03/17/2018 0.64  0.44 - 1.00 mg/dL Final  . Calcium 03/17/2018 9.2  8.9 - 10.3 mg/dL Final  . GFR calc non Af Amer 03/17/2018 >60  >60 mL/min Final  . GFR calc Af Amer 03/17/2018 >60  >60 mL/min Final   Comment: (NOTE) The eGFR has been calculated using the CKD EPI equation. This calculation has not been validated in all clinical situations. eGFR's persistently <60 mL/min signify possible Chronic Kidney Disease.   Georgiann Hahn gap 03/17/2018 8  5 - 15 Final   Performed at [facility], [place] [address] [place], [place] [address]  . WBC 03/17/2018 7.0  4.0 - 10.5 K/uL Final  . RBC 03/17/2018 3.75* 3.87 - 5.11 MIL/uL Final  . Hemoglobin 03/17/2018 11.6* 12.0 - 15.0 g/dL Final  . HCT 03/17/2018 36.8  36.0 - 46.0 % Final  . MCV 03/17/2018 98.1  78.0 - 100.0 fL Final  . MCH 03/17/2018 30.9  26.0 - 34.0 pg Final  . MCHC 03/17/2018  31.5  30.0 - 36.0 g/dL Final  . RDW 03/17/2018 13.8  11.5 - 15.5 % Final  . Platelets 03/17/2018 234  150 - 400 K/uL Final  . Neutrophils Relative % 03/17/2018 55  % Final  . Neutro Abs 03/17/2018 3.8  1.7 - 7.7 K/uL Final  . Lymphocytes Relative 03/17/2018 31  % Final  . Lymphs Abs 03/17/2018 2.1  0.7 - 4.0 K/uL Final  . Monocytes Relative  03/17/2018 10  % Final  . Monocytes Absolute 03/17/2018 0.7  0.1 - 1.0 K/uL Final  . Eosinophils Relative 03/17/2018 4  % Final  . Eosinophils Absolute 03/17/2018 0.3  0.0 - 0.7 K/uL Final  . Basophils Relative 03/17/2018 0  % Final  . Basophils Absolute 03/17/2018 0.0  0.0 - 0.1 K/uL Final  . Immature Granulocytes 03/17/2018 0  % Final  . Abs Immature Granulocytes 03/17/2018 0.0  0.0 - 0.1 K/uL Final   Performed at [facility], [place] [address] [place], [place] [address]    (this displays the last labs from the last 3 days)  No results found for: TOTALPROTELP, ALBUMINELP, A1GS, A2GS, BETS, BETA2SER, GAMS, MSPIKE, SPEI (this displays SPEP labs)  No results found for: KPAFRELGTCHN, LAMBDASER, KAPLAMBRATIO (kappa/lambda light chains)  No results found for: HGBA, HGBA2QUANT, HGBFQUANT, HGBSQUAN (Hemoglobinopathy evaluation)   No results found for: LDH  No results found for: IRON, TIBC, IRONPCTSAT (Iron and TIBC)  No results found for: FERRITIN  Urinalysis    Component Value Date/Time   COLORURINE YELLOW 08/10/2017 0924   APPEARANCEUR CLEAR 08/10/2017 0924   LABSPEC 1.010 08/10/2017 0924   PHURINE 6.5 08/10/2017 [place] 08/10/2017 0924   HGBUR NEGATIVE 08/10/2017 [place] 08/10/2017 0924   KETONESUR NEGATIVE 08/10/2017 0924   PROTEINUR NEGATIVE 08/10/2017 0924   NITRITE NEGATIVE 08/10/2017 0924   LEUKOCYTESUR SMALL (A) 08/10/2017 0924     STUDIES: Echo due late October 19  ELIGIBLE FOR AVAILABLE RESEARCH PROTOCOL:no  ASSESSMENT: 72 y.o. [place] woman (primarily residing in  [place])  (1) status post left lumpectomy in 2012 for a reported pT2 pN1 breast cancer, the patient refusing adjuvant treatment (no chemotherapy, radiation, or antiestrogens).  METASTATIC DISEASE: December 2018 (bilateral triple positive disease) (2) status post left breast biopsy 03/30/2017 for a clinical T4 N0, stage IIIB invasive ductal carcinoma, grade 3, triple positive, with an MIB-1 of 70%  (a) staging studies 05/21/2017 show multiple bilateral pulmonary nodules consistent with stage IV disease 05/21/2017; there are no liver or bone lesions noted  (b) breast MRI 05/13/2017 shows a 7 cm area of non-masslike enhancement in the right breast, with biopsy ([id number]) on 06/24/2017 showing: Metastatic carcinoma. Prognostic panel was strongly ER and PR positive as well as HER-2 positive with the signals ratio of 2.61 and number per cell 5.35 (triple positive)  (c) left breast skin biopsy 02/02/2018 confirms triple positive breast cancer  (3) neoadjuvant chemotherapy with T-DM1 started 06/29/2016  (a) echocardiogram 06/24/2017 showed an ejection fraction of 60-65% range  (b) echocardiogram 09/23/2017 showed an ejection fraction of 65-70% range  (c) CT chest on 10/12/2017 shows progression in nodes, pulmonary nodules, and bone mets   (4) definitive surgery 03/21/2018  (5) anastrozole written on 09/21/2017, started 02/13/2018, discontinued after 2 weeks with visual changes  (a) patient notes inability to tolerate tamoxifen in the past (nausea).  (6) genetics testing pending  (7) chemotherapy with carboplatin, gemcitabine, and trastuzumab 11/01/2017-01/18/2018, with Gemcitabine and Carboplatin given on day 1, and Gemcitabine, Carboplatin, and Trastuzumab given on day 8,repeated every 21 days x 4, or to optimal response  (a) carboplatin omitted from cycle 4 because of neuropathy  (8) trastuzumab to be continued indefinitely  (a) echocardiogram 01/11/2018 showed an ejection fraction in the 65-70%  range  PLAN: Tracy Howell has had a good response to her treatment and I am hopeful we will be able to obtain clear margins from her left modified radical mastectomy planned  for next week.  She is going to need some uncal plasty and skin grafting.  We will review the pathology when she returns to see me 04/08/2018.  We discussed the marsupialized abscess in her belly.  She is going to do a wet-to-dry and further instructions as per Dr. Lucia Gaskins  She was unable to tolerate anastrozole.  We will likely try exemestane.  If she cannot tolerate exemestane we will go to fulvestrant.  She is being scheduled for an echocardiogram later this month  Assuming all goes well the plan will be to continue antiestrogens and anti-HER-2 treatment indefinitely.  She will need complete restaging studies sometime 6 to 12 months from now  She knows to call for any other issues that may develop before the next visit.   Kelbi Renstrom, Virgie Dad, MD  03/18/18 9:41 AM Medical Oncology and Hematology [facility] [address] [place], [place] [address] Tel. [phone]    Fax. [phone]  Alice Rieger, am acting as scribe for Chauncey Cruel MD.  I, Lurline Del MD, have reviewed the above documentation for accuracy and completeness, and I agree with the above.

## 2018-03-18 ENCOUNTER — Inpatient Hospital Stay: Payer: Medicare Other | Attending: Oncology

## 2018-03-18 ENCOUNTER — Telehealth: Payer: Self-pay | Admitting: Oncology

## 2018-03-18 ENCOUNTER — Other Ambulatory Visit (HOSPITAL_COMMUNITY): Payer: Medicare Other

## 2018-03-18 ENCOUNTER — Inpatient Hospital Stay: Payer: Medicare Other

## 2018-03-18 ENCOUNTER — Inpatient Hospital Stay (HOSPITAL_BASED_OUTPATIENT_CLINIC_OR_DEPARTMENT_OTHER): Payer: Medicare Other | Admitting: Oncology

## 2018-03-18 VITALS — BP 165/67 | HR 95 | Temp 97.9°F | Resp 18 | Ht 66.5 in | Wt 227.6 lb

## 2018-03-18 DIAGNOSIS — Z170 Estrogen receptor positive status [ER+]: Secondary | ICD-10-CM

## 2018-03-18 DIAGNOSIS — C50912 Malignant neoplasm of unspecified site of left female breast: Principal | ICD-10-CM | POA: Diagnosis present

## 2018-03-18 DIAGNOSIS — C50212 Malignant neoplasm of upper-inner quadrant of left female breast: Secondary | ICD-10-CM

## 2018-03-18 DIAGNOSIS — Z6836 Body mass index (BMI) 36.0-36.9, adult: Secondary | ICD-10-CM

## 2018-03-18 DIAGNOSIS — Z79899 Other long term (current) drug therapy: Secondary | ICD-10-CM | POA: Diagnosis not present

## 2018-03-18 DIAGNOSIS — E109 Type 1 diabetes mellitus without complications: Secondary | ICD-10-CM | POA: Diagnosis not present

## 2018-03-18 DIAGNOSIS — Z853 Personal history of malignant neoplasm of breast: Secondary | ICD-10-CM | POA: Diagnosis not present

## 2018-03-18 DIAGNOSIS — C7800 Secondary malignant neoplasm of unspecified lung: Secondary | ICD-10-CM | POA: Diagnosis not present

## 2018-03-18 DIAGNOSIS — Z9221 Personal history of antineoplastic chemotherapy: Secondary | ICD-10-CM

## 2018-03-18 DIAGNOSIS — G620 Drug-induced polyneuropathy: Secondary | ICD-10-CM | POA: Diagnosis not present

## 2018-03-18 DIAGNOSIS — Z794 Long term (current) use of insulin: Secondary | ICD-10-CM | POA: Diagnosis not present

## 2018-03-18 DIAGNOSIS — E669 Obesity, unspecified: Secondary | ICD-10-CM | POA: Diagnosis present

## 2018-03-18 DIAGNOSIS — L089 Local infection of the skin and subcutaneous tissue, unspecified: Secondary | ICD-10-CM | POA: Diagnosis present

## 2018-03-18 DIAGNOSIS — Z803 Family history of malignant neoplasm of breast: Secondary | ICD-10-CM

## 2018-03-18 DIAGNOSIS — L723 Sebaceous cyst: Secondary | ICD-10-CM | POA: Diagnosis present

## 2018-03-18 DIAGNOSIS — C7951 Secondary malignant neoplasm of bone: Secondary | ICD-10-CM | POA: Diagnosis present

## 2018-03-18 LAB — CBC WITH DIFFERENTIAL (CANCER CENTER ONLY)
BASOS ABS: 0 10*3/uL (ref 0.0–0.1)
BASOS PCT: 1 %
EOS ABS: 0.3 10*3/uL (ref 0.0–0.5)
Eosinophils Relative: 6 %
HCT: 34.8 % (ref 34.8–46.6)
HEMOGLOBIN: 11.4 g/dL — AB (ref 11.6–15.9)
Lymphocytes Relative: 34 %
Lymphs Abs: 1.5 10*3/uL (ref 0.9–3.3)
MCH: 31.2 pg (ref 25.1–34.0)
MCHC: 32.8 g/dL (ref 31.5–36.0)
MCV: 95.1 fL (ref 79.5–101.0)
MONOS PCT: 9 %
Monocytes Absolute: 0.4 10*3/uL (ref 0.1–0.9)
NEUTROS PCT: 50 %
Neutro Abs: 2.3 10*3/uL (ref 1.5–6.5)
Platelet Count: 230 10*3/uL (ref 145–400)
RBC: 3.66 MIL/uL — ABNORMAL LOW (ref 3.70–5.45)
RDW: 14.7 % — ABNORMAL HIGH (ref 11.2–14.5)
WBC Count: 4.5 10*3/uL (ref 3.9–10.3)

## 2018-03-18 LAB — CMP (CANCER CENTER ONLY)
ALK PHOS: 132 U/L — AB (ref 38–126)
ALT: 22 U/L (ref 0–44)
ANION GAP: 11 (ref 5–15)
AST: 35 U/L (ref 15–41)
Albumin: 3.2 g/dL — ABNORMAL LOW (ref 3.5–5.0)
BILIRUBIN TOTAL: 0.4 mg/dL (ref 0.3–1.2)
BUN: 9 mg/dL (ref 8–23)
CALCIUM: 9.7 mg/dL (ref 8.9–10.3)
CO2: 28 mmol/L (ref 22–32)
CREATININE: 0.76 mg/dL (ref 0.44–1.00)
Chloride: 104 mmol/L (ref 98–111)
Glucose, Bld: 103 mg/dL — ABNORMAL HIGH (ref 70–99)
Potassium: 3.5 mmol/L (ref 3.5–5.1)
SODIUM: 143 mmol/L (ref 135–145)
TOTAL PROTEIN: 7.7 g/dL (ref 6.5–8.1)

## 2018-03-18 MED ORDER — DIPHENHYDRAMINE HCL 25 MG PO CAPS
25.0000 mg | ORAL_CAPSULE | Freq: Once | ORAL | Status: AC
  Administered 2018-03-18: 25 mg via ORAL

## 2018-03-18 MED ORDER — SODIUM CHLORIDE 0.9 % IV SOLN
Freq: Once | INTRAVENOUS | Status: AC
  Administered 2018-03-18: 12:00:00 via INTRAVENOUS
  Filled 2018-03-18: qty 250

## 2018-03-18 MED ORDER — DIPHENHYDRAMINE HCL 25 MG PO CAPS
ORAL_CAPSULE | ORAL | Status: AC
  Filled 2018-03-18: qty 1

## 2018-03-18 MED ORDER — TRASTUZUMAB CHEMO 150 MG IV SOLR
600.0000 mg | Freq: Once | INTRAVENOUS | Status: AC
  Administered 2018-03-18: 600 mg via INTRAVENOUS
  Filled 2018-03-18: qty 28.57

## 2018-03-18 MED ORDER — HEPARIN SOD (PORK) LOCK FLUSH 100 UNIT/ML IV SOLN
500.0000 [IU] | Freq: Once | INTRAVENOUS | Status: AC | PRN
  Administered 2018-03-18: 500 [IU]
  Filled 2018-03-18: qty 5

## 2018-03-18 MED ORDER — LORAZEPAM 0.5 MG PO TABS: 0.5000 mg | ORAL_TABLET | Freq: Every evening | ORAL | 0 refills | Status: AC | PRN

## 2018-03-18 MED ORDER — SODIUM CHLORIDE 0.9% FLUSH
10.0000 mL | INTRAVENOUS | Status: DC | PRN
  Administered 2018-03-18: 10 mL
  Filled 2018-03-18: qty 10

## 2018-03-18 MED ORDER — ACETAMINOPHEN 325 MG PO TABS
ORAL_TABLET | ORAL | Status: AC
  Filled 2018-03-18: qty 2

## 2018-03-18 MED ORDER — ACETAMINOPHEN 325 MG PO TABS
650.0000 mg | ORAL_TABLET | Freq: Once | ORAL | Status: AC
  Administered 2018-03-18: 650 mg via ORAL

## 2018-03-18 NOTE — Patient Instructions (Signed)
[facility] Discharge Instructions for Patients Receiving Chemotherapy  Today you received the following chemotherapy agents Herceptin  To help prevent nausea and vomiting after your treatment, we encourage you to take your nausea medication as directed   If you develop nausea and vomiting that is not controlled by your nausea medication, call the clinic.   BELOW ARE SYMPTOMS THAT SHOULD BE REPORTED IMMEDIATELY:  *FEVER GREATER THAN 100.5 F  *CHILLS WITH OR WITHOUT FEVER  NAUSEA AND VOMITING THAT IS NOT CONTROLLED WITH YOUR NAUSEA MEDICATION  *UNUSUAL SHORTNESS OF BREATH  *UNUSUAL BRUISING OR BLEEDING  TENDERNESS IN MOUTH AND THROAT WITH OR WITHOUT PRESENCE OF ULCERS  *URINARY PROBLEMS  *BOWEL PROBLEMS  UNUSUAL RASH Items with * indicate a potential emergency and should be followed up as soon as possible.  Feel free to call the clinic should you have any questions or concerns. The clinic phone number is [phone].  Please show the CHEMO ALERT CARD at check-in to the Emergency Department and triage nurse.   

## 2018-03-18 NOTE — Telephone Encounter (Signed)
Gave pt avs and calendar  °

## 2018-03-19 LAB — CANCER ANTIGEN 27.29: CAN 27.29: 18.8 U/mL (ref 0.0–38.6)

## 2018-03-20 NOTE — H&P (Signed)
Tracy Howell  Location: [facility] Patient #: [id number] DOB: 1946-06-06 Married / Language: English / Race: Black or African American Female  History of Present Illness   The patient is a 72 year old female who presents with a complaint of breast cancer.  Goes by "Tracy Howell"  The PCP is Dr. Clayburn Pert  The pateint was seen at the [facility] on 05/12/2017 - Oncology is Drs. Magrinat and Isidore Moos  Her Howell is with her.  She had chemotx with carboplatin, gemcitabine, and trastuzumab started 11/01/2017 (she had some trouble with neuropathy, so carboplatin omitted from cycle 4. Dr. Jana Hakim has now completed his chemotx on 01/19/2018 - and it plans for surgery. A CT scan of the chest on 10/02/2017 (before she started chemotx) showed a 9.0 x 3.8 mass involving the superfical portion of most of her left breast. She also had 1. Increase in size of nodal metastasis within the mediastinum bilateral axilla and left supraclavicular region. 2. Increase in size of bilateral pulmonary nodules. 3. Progression of lytic bone metastasis. I discussed with Tracy Howell about the next step in surgery. I discussed the difficulties of getting skin coverage if I remove all the nodules in her skin. I will plastic surgery see her for assistance in coverage of her left chest wall. I discussed the surgery, length of hospitalization, drains after surgery, and gave her a booklet on breast cancer surgery.  Plan: 1) Completed chemotherapy - significant improvement of left breast cancer - though she still has skin nodules some 10 cm from areola. I did a punch biopsy today of one of the most distant nodules. 2) Consult plastic surgery for assistance with coverage. 3) Left modified radical mastectomy  History of left breast cancer: She underwent a left breast lumpectomy in [place] ([place]) in 2012 for a 2.5 cm left breast cancer. She  said that they took out 2 lymph nodes, both were negative. (Though at one time, we heard that she had 1/5 nodes positive). She thinks her tumor in 2012 was ER positive, PR positive, HER-2/neu positive. she took no additional treatment. She has delayed coming to see [place] at [facility] once already. She is worried about having an open wound to manage.  Mammograms: [facility], 5 cm mass left breast mass on US Biopsy: left breast on 03/30/2017, ([id number]), invasive ductal cancer, Grade 3, ER - positive, PR - positive, Ki67 - 70%, Her2Neu - positive Family history of breast or ovarian cancer: She was adopted, but her birth mother did have breast cancer. On hormone therapy: None.  Past Medical History: 1. 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  She had chemotx with carboplatin, gemcitabine, and trastuzumab started 11/01/2017 (she had some trouble with neuropathy, so carboplatin omitted from cycle 4. Completed chemotx on 01/19/2018.  She has seen Dr. Isidore Moos once on 05/12/2017. 2. Hysterectomy - [facility]  For benign disease 3. Cholecystectomy - [facility] - 2010 4. History of appendectomy 5. Colonoscopy by Dr. Collene Mares around 2008. 6. Right subclavian power port - 05/16/2018 - D. Kimmberly Wisser  Social History: She has her Howell, [name]. Friend, Dr. Osa Craver. 2 sons - Vito Berger (60) trucker in [place], [place], and [employer] [phone]) in [place]. 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  She  splits her time between [place] and [place], [place]. It sounds like she spends every other month up here and works with Dr. Fransico Setters office.  Allergies (April Staton, CMA; 02/02/2018 8:51 AM) Aleve *ANALGESICS -  ANTI-INFLAMMATORY*  Codeine Phosphate *ANALGESICS - OPIOID*  Morphine Sulfate *ANALGESICS - OPIOID*  Penicillins   Medication History (April Staton, CMA; 02/02/2018 8:51 AM) Anastrozole ('1MG'$  Tablet, Oral) Active. Vitamin C ('100MG'$  Tablet, Oral) Active. B Complex (Oral) Active. Cod Liver Oil (Oral) Specific strength unknown - Active. Lidocaine-Prilocaine (External) Specific strength unknown - Active. Multiple Vitamin (Oral) Active. TraMADol HCl ('50MG'$  Tablet, Oral) Active. Triamterene-HCTZ (37.5-'25MG'$  Capsule, Oral) Active. Medications Reconciled  Vitals (April Staton CMA; 02/02/2018 8:52 AM) 02/02/2018 8:51 AM Weight: 227.38 lb Height: 66in Body Surface Area: 2.11 m Body Mass Index: 36.7 kg/m  Temp.: 98.70F(Oral)  Pulse: 97 (Regular)  BP: 150/70 (Sitting, Left Arm, Standard)   Physical Exam  General: WN obese AA F alert. Somewhat anxious. Skin: Inspection and palpation of the skin unremarkable.  Eyes: Conjunctivae white, pupils equal. Face, ears, nose, mouth, and throat: Face - normal. Normal ears and nose. Lips and teeth normal.  Neck: Supple. No mass. Trachea midline. No thyroid mass.  Lymph Nodes: No supraclavicular or cervical adenopathy. I am not sure that I feel any definite mass in the left axilla.  Lungs: Normal respiratory effort. Clear to auscultation and symmetric breath sounds. Cardiovascular: Regular rate and rythm. Normal auscultation of the heart. No murmur or rub.  Breasts: Right: No mass or nodule. She has a port in the right upper chest. Left - She has had significant improvement in the left breast mass. It is now about 6 to 8 cm and mobile. But she does have skin nodules/pigmentation which goes about 10 cm from the areola. I biopsied one of these nodules today to measure the extent of disease. She has some desquamation around the nipple. [Photos in Epic]  Abdomen: Soft. No mass. Liver and spleen not  palpable. No tenderness. No hernia. Normal bowel sounds.  She has a 2.0 cm mass midway between the xiphoid and umbilicus - feels like a sebaceous cyst.  Musculoskeletal/extremities: Normal gait. She has some numbness of her hands.  Neurologic: Grossly intact to motor and sensory function.   Psychiatric: Has normal mood and affect. Judgement and insight appear normal.  Assessment & Plan  1.  BREAST CANCER, STAGE 4, LEFT (C50.912)  Story: Biopsy: left breast on 03/30/2017, ([id number]), invasive ductal cancer, Grade 3, ER - positive, PR - positive, Her2Neu - positive  By CT scan she has mets to bone and lung  Oncology - Magrinat and Isidore Moos  Plan:   1) Completed chemotherapy - significant improvement of left breast cancer - though she still has skin nodules some 10 cm from areola    I did a punch biopsy on 02/02/2018 of one of the most distant nodules - proved to have metastatic breast cancer.    2) Seen by Dr.Thimmappa for assistance with coverage of mastectomy wound  Met with her this am. Told her that we would need to plan for everything- skin graft, local skin flaps from chest and abdomen and latissimus (I would only plan latter if she had any exposed ribs/pectoralis resection planned.  We discussed TRAM- she had redundant skin there that could cover- I do not recommend this though as I am trying to limit her risks. She would be considered high risk abdominal wall and flap complications for this at her weight.  I told her I would  discuss with you, am prepared to do all at once but one consideration would be to stage so that margins could be assessed, would have to ask you. I understand the margins in her are relative as looks like a lot of skip lesions.  Is she going to be offered post mastectomy XRT? Any skin grafts may not stand up well to radiation but certainly easier to recover from than a flap   Thanks Brinda    3) Left modified radical mastectomy  2.   Infected sebaceous cyst of anterior abdomen  I&D on 03/16/2018  Alphonsa Overall, MD, [facility] Pager: [phone] Office phone:  [phone]

## 2018-03-20 NOTE — Anesthesia Preprocedure Evaluation (Addendum)
Anesthesia Evaluation  Patient identified by MRN, date of birth, ID band Patient awake    Reviewed: Allergy & Precautions, NPO status , Patient's Chart, lab work & pertinent test results  History of Anesthesia Complications Negative for: history of anesthetic complications (Per pt: "slow to wake up")  Airway Mallampati: II  TM Distance: >3 FB     Dental  (+) Teeth Intact, Dental Advisory Given   Pulmonary neg pulmonary ROS,  Bilateral pulmonary nodules consistent with metastatic disease, lytic bone mets to 7th rib and T12, mediastinal lymphadenopathy.   Pulmonary exam normal        Cardiovascular Normal cardiovascular exam  TTE 12/2017: EF 68-37%, gr 2 diastolic dysfxn   Neuro/Psych Anxiety negative neurological ROS     GI/Hepatic negative GI ROS, Neg liver ROS, Hx of SBO 07/2014   Endo/Other  Obese (BMI 36)  Renal/GU negative Renal ROS     Musculoskeletal   Abdominal (+) + obese,   Peds  Hematology  (+) anemia , Hereditary angioedema   Anesthesia Other Findings Port-a-cath in place  Reproductive/Obstetrics negative OB ROS                         Anesthesia Physical Anesthesia Plan  ASA: II  Anesthesia Plan: General   Post-op Pain Management: GA combined w/ Regional for post-op pain   Induction: Intravenous  PONV Risk Score and Plan: 4 or greater and Ondansetron, Dexamethasone, Treatment may vary due to age or medical condition, Scopolamine patch - Pre-op and Midazolam  Airway Management Planned: Oral ETT  Additional Equipment:   Intra-op Plan:   Post-operative Plan: Extubation in OR  Informed Consent: I have reviewed the patients History and Physical, chart, labs and discussed the procedure including the risks, benefits and alternatives for the proposed anesthesia with the patient or authorized representative who has indicated his/her understanding and acceptance.   Dental  advisory given  Plan Discussed with: CRNA and Surgeon  Anesthesia Plan Comments:         Anesthesia Quick Evaluation

## 2018-03-21 ENCOUNTER — Encounter (HOSPITAL_COMMUNITY)
Admission: RE | Disposition: A | Discharge status: Home / Self Care | Payer: Self-pay | Source: Ambulatory Visit | Attending: Plastic Surgery

## 2018-03-21 ENCOUNTER — Inpatient Hospital Stay (HOSPITAL_COMMUNITY): Payer: Medicare Other | Admitting: Anesthesiology

## 2018-03-21 ENCOUNTER — Inpatient Hospital Stay (HOSPITAL_COMMUNITY)
Admission: RE | Admit: 2018-03-21 | Discharge: 2018-03-23 | DRG: 580 | Disposition: A | Discharge status: Home / Self Care | Payer: Medicare Other | Source: Ambulatory Visit | Attending: Plastic Surgery | Admitting: Plastic Surgery

## 2018-03-21 ENCOUNTER — Encounter (HOSPITAL_COMMUNITY): Payer: Self-pay | Admitting: Surgery

## 2018-03-21 ENCOUNTER — Other Ambulatory Visit: Payer: Self-pay

## 2018-03-21 DIAGNOSIS — Z170 Estrogen receptor positive status [ER+]: Secondary | ICD-10-CM | POA: Diagnosis not present

## 2018-03-21 DIAGNOSIS — L723 Sebaceous cyst: Secondary | ICD-10-CM | POA: Diagnosis not present

## 2018-03-21 DIAGNOSIS — E669 Obesity, unspecified: Secondary | ICD-10-CM | POA: Diagnosis not present

## 2018-03-21 DIAGNOSIS — C50912 Malignant neoplasm of unspecified site of left female breast: Secondary | ICD-10-CM | POA: Diagnosis present

## 2018-03-21 DIAGNOSIS — C50812 Malignant neoplasm of overlapping sites of left female breast: Secondary | ICD-10-CM | POA: Diagnosis not present

## 2018-03-21 DIAGNOSIS — G8918 Other acute postprocedural pain: Secondary | ICD-10-CM | POA: Diagnosis not present

## 2018-03-21 DIAGNOSIS — C7800 Secondary malignant neoplasm of unspecified lung: Secondary | ICD-10-CM | POA: Diagnosis not present

## 2018-03-21 DIAGNOSIS — Z9221 Personal history of antineoplastic chemotherapy: Secondary | ICD-10-CM | POA: Diagnosis not present

## 2018-03-21 DIAGNOSIS — L089 Local infection of the skin and subcutaneous tissue, unspecified: Secondary | ICD-10-CM | POA: Diagnosis not present

## 2018-03-21 DIAGNOSIS — Z79899 Other long term (current) drug therapy: Secondary | ICD-10-CM | POA: Diagnosis not present

## 2018-03-21 DIAGNOSIS — Z803 Family history of malignant neoplasm of breast: Secondary | ICD-10-CM | POA: Diagnosis not present

## 2018-03-21 DIAGNOSIS — C7951 Secondary malignant neoplasm of bone: Secondary | ICD-10-CM | POA: Diagnosis not present

## 2018-03-21 DIAGNOSIS — Z6836 Body mass index (BMI) 36.0-36.9, adult: Secondary | ICD-10-CM | POA: Diagnosis not present

## 2018-03-21 HISTORY — PX: APPLICATION OF WOUND VAC: SHX5189

## 2018-03-21 HISTORY — PX: MASTECTOMY WITH AXILLARY LYMPH NODE DISSECTION: SHX5661

## 2018-03-21 HISTORY — PX: TRAM: SHX5363

## 2018-03-21 SURGERY — MASTECTOMY WITH AXILLARY LYMPH NODE DISSECTION
Anesthesia: General | Site: Chest | Laterality: Left

## 2018-03-21 MED ORDER — DEXAMETHASONE SODIUM PHOSPHATE 10 MG/ML IJ SOLN
INTRAMUSCULAR | Status: AC
  Filled 2018-03-21: qty 1

## 2018-03-21 MED ORDER — ENOXAPARIN SODIUM 40 MG/0.4ML ~~LOC~~ SOLN
40.0000 mg | SUBCUTANEOUS | Status: DC
  Administered 2018-03-22: 40 mg via SUBCUTANEOUS
  Filled 2018-03-21 (×2): qty 0.4

## 2018-03-21 MED ORDER — MEPERIDINE HCL 50 MG/ML IJ SOLN: 6.2500 mg | INTRAMUSCULAR | Status: DC | PRN

## 2018-03-21 MED ORDER — MIDAZOLAM HCL 5 MG/5ML IJ SOLN
INTRAMUSCULAR | Status: DC | PRN
  Administered 2018-03-21: 1 mg via INTRAVENOUS

## 2018-03-21 MED ORDER — PHENYLEPHRINE 40 MCG/ML (10ML) SYRINGE FOR IV PUSH (FOR BLOOD PRESSURE SUPPORT)
PREFILLED_SYRINGE | INTRAVENOUS | Status: AC
  Filled 2018-03-21: qty 10

## 2018-03-21 MED ORDER — EPHEDRINE SULFATE 50 MG/ML IJ SOLN
INTRAMUSCULAR | Status: DC | PRN
  Administered 2018-03-21: 5 mg via INTRAVENOUS

## 2018-03-21 MED ORDER — HYDROMORPHONE HCL 1 MG/ML IJ SOLN: 0.5000 mg | INTRAMUSCULAR | Status: DC | PRN

## 2018-03-21 MED ORDER — ENSURE ENLIVE PO LIQD
237.0000 mL | Freq: Two times a day (BID) | ORAL | Status: DC
  Administered 2018-03-22 – 2018-03-23 (×3): 237 mL via ORAL

## 2018-03-21 MED ORDER — LIDOCAINE 2% (20 MG/ML) 5 ML SYRINGE
INTRAMUSCULAR | Status: AC
  Filled 2018-03-21: qty 5

## 2018-03-21 MED ORDER — ONDANSETRON HCL 4 MG/2ML IJ SOLN
INTRAMUSCULAR | Status: DC | PRN
  Administered 2018-03-21: 4 mg via INTRAVENOUS

## 2018-03-21 MED ORDER — ONDANSETRON 4 MG PO TBDP: 4.0000 mg | ORAL_TABLET | Freq: Four times a day (QID) | ORAL | Status: DC | PRN

## 2018-03-21 MED ORDER — ONDANSETRON HCL 4 MG/2ML IJ SOLN
INTRAMUSCULAR | Status: AC
  Filled 2018-03-21: qty 2

## 2018-03-21 MED ORDER — BUPIVACAINE-EPINEPHRINE (PF) 0.5% -1:200000 IJ SOLN
INTRAMUSCULAR | Status: DC | PRN
  Administered 2018-03-21: 30 mL via PERINEURAL

## 2018-03-21 MED ORDER — ARTIFICIAL TEARS OPHTHALMIC OINT
TOPICAL_OINTMENT | OPHTHALMIC | Status: DC | PRN
  Administered 2018-03-21: 1 via OPHTHALMIC

## 2018-03-21 MED ORDER — ACETAMINOPHEN 10 MG/ML IV SOLN
INTRAVENOUS | Status: AC
  Filled 2018-03-21: qty 100

## 2018-03-21 MED ORDER — LIDOCAINE-EPINEPHRINE (PF) 1 %-1:200000 IJ SOLN
INTRAMUSCULAR | Status: AC
  Filled 2018-03-21: qty 30

## 2018-03-21 MED ORDER — PROPOFOL 10 MG/ML IV BOLUS
INTRAVENOUS | Status: DC | PRN
  Administered 2018-03-21: 20 mg via INTRAVENOUS
  Administered 2018-03-21: 150 mg via INTRAVENOUS

## 2018-03-21 MED ORDER — ROCURONIUM BROMIDE 50 MG/5ML IV SOSY
PREFILLED_SYRINGE | INTRAVENOUS | Status: AC
  Filled 2018-03-21: qty 10

## 2018-03-21 MED ORDER — ACETAMINOPHEN 160 MG/5ML PO SOLN: 325.0000 mg | Freq: Once | ORAL | Status: DC | PRN

## 2018-03-21 MED ORDER — BACITRACIN ZINC 500 UNIT/GM EX OINT
TOPICAL_OINTMENT | CUTANEOUS | Status: AC
  Filled 2018-03-21: qty 28.35

## 2018-03-21 MED ORDER — CEFAZOLIN SODIUM-DEXTROSE 2-4 GM/100ML-% IV SOLN
2.0000 g | INTRAVENOUS | Status: AC
  Administered 2018-03-21: 2 g via INTRAVENOUS
  Filled 2018-03-21: qty 100

## 2018-03-21 MED ORDER — BUPIVACAINE HCL (PF) 0.25 % IJ SOLN
INTRAMUSCULAR | Status: AC
  Filled 2018-03-21: qty 30

## 2018-03-21 MED ORDER — TRAMADOL HCL 50 MG PO TABS
ORAL_TABLET | ORAL | Status: AC
  Filled 2018-03-21: qty 1

## 2018-03-21 MED ORDER — HEPARIN SODIUM (PORCINE) 5000 UNIT/ML IJ SOLN
5000.0000 [IU] | Freq: Once | INTRAMUSCULAR | Status: AC
  Administered 2018-03-21: 5000 [IU] via SUBCUTANEOUS
  Filled 2018-03-21: qty 1

## 2018-03-21 MED ORDER — SODIUM CHLORIDE 0.9 % IV SOLN
INTRAVENOUS | Status: AC
  Filled 2018-03-21: qty 500000

## 2018-03-21 MED ORDER — LIDOCAINE HCL (CARDIAC) PF 100 MG/5ML IV SOSY
PREFILLED_SYRINGE | INTRAVENOUS | Status: DC | PRN
  Administered 2018-03-21: 40 mg via INTRAVENOUS
  Administered 2018-03-21: 60 mg via INTRAVENOUS

## 2018-03-21 MED ORDER — LACTATED RINGERS IV SOLN
INTRAVENOUS | Status: DC | PRN
  Administered 2018-03-21: 08:00:00 via INTRAVENOUS

## 2018-03-21 MED ORDER — ONDANSETRON HCL 4 MG/2ML IJ SOLN: 4.0000 mg | Freq: Four times a day (QID) | INTRAMUSCULAR | Status: DC | PRN

## 2018-03-21 MED ORDER — SCOPOLAMINE 1 MG/3DAYS TD PT72
MEDICATED_PATCH | TRANSDERMAL | Status: DC | PRN
  Administered 2018-03-21: 1 via TRANSDERMAL

## 2018-03-21 MED ORDER — ACETAMINOPHEN 10 MG/ML IV SOLN
1000.0000 mg | Freq: Once | INTRAVENOUS | Status: DC | PRN
  Administered 2018-03-21: 1000 mg via INTRAVENOUS

## 2018-03-21 MED ORDER — ACETAMINOPHEN 500 MG PO TABS: 1000.0000 mg | ORAL_TABLET | Freq: Once | ORAL | Status: DC | PRN

## 2018-03-21 MED ORDER — PHENYLEPHRINE HCL 10 MG/ML IJ SOLN
INTRAMUSCULAR | Status: DC | PRN
  Administered 2018-03-21 (×2): 80 ug via INTRAVENOUS
  Administered 2018-03-21: 40 ug via INTRAVENOUS
  Administered 2018-03-21: 160 ug via INTRAVENOUS
  Administered 2018-03-21: 40 ug via INTRAVENOUS

## 2018-03-21 MED ORDER — FENTANYL CITRATE (PF) 100 MCG/2ML IJ SOLN: 25.0000 ug | INTRAMUSCULAR | Status: DC | PRN

## 2018-03-21 MED ORDER — SODIUM CHLORIDE 0.9 % IV SOLN
INTRAVENOUS | Status: DC | PRN
  Administered 2018-03-21: 25 ug/min via INTRAVENOUS

## 2018-03-21 MED ORDER — ADULT MULTIVITAMIN W/MINERALS CH
1.0000 | ORAL_TABLET | Freq: Every day | ORAL | Status: DC
  Administered 2018-03-21 – 2018-03-22 (×2): 1 via ORAL
  Filled 2018-03-21 (×2): qty 1

## 2018-03-21 MED ORDER — METHOCARBAMOL 500 MG PO TABS: 500.0000 mg | ORAL_TABLET | Freq: Three times a day (TID) | ORAL | Status: DC | PRN

## 2018-03-21 MED ORDER — TRAMADOL HCL 50 MG PO TABS
50.0000 mg | ORAL_TABLET | Freq: Four times a day (QID) | ORAL | Status: DC | PRN
  Administered 2018-03-21 – 2018-03-23 (×3): 50 mg via ORAL
  Filled 2018-03-21 (×3): qty 1

## 2018-03-21 MED ORDER — DEXAMETHASONE SODIUM PHOSPHATE 4 MG/ML IJ SOLN
INTRAMUSCULAR | Status: DC | PRN
  Administered 2018-03-21: 5 mg via INTRAVENOUS

## 2018-03-21 MED ORDER — LACTATED RINGERS IV SOLN
INTRAVENOUS | Status: DC | PRN
  Administered 2018-03-21: 07:00:00 via INTRAVENOUS

## 2018-03-21 MED ORDER — FENTANYL CITRATE (PF) 250 MCG/5ML IJ SOLN
INTRAMUSCULAR | Status: AC
  Filled 2018-03-21: qty 5

## 2018-03-21 MED ORDER — TRIAMTERENE-HCTZ 37.5-25 MG PO CAPS
1.0000 | ORAL_CAPSULE | Freq: Every day | ORAL | Status: DC
  Filled 2018-03-21: qty 1

## 2018-03-21 MED ORDER — SUGAMMADEX SODIUM 200 MG/2ML IV SOLN
INTRAVENOUS | Status: DC | PRN
  Administered 2018-03-21: 205 mg via INTRAVENOUS

## 2018-03-21 MED ORDER — LORAZEPAM 0.5 MG PO TABS: 0.5000 mg | ORAL_TABLET | Freq: Every evening | ORAL | Status: DC | PRN

## 2018-03-21 MED ORDER — 0.9 % SODIUM CHLORIDE (POUR BTL) OPTIME
TOPICAL | Status: DC | PRN
  Administered 2018-03-21 (×2): 1000 mL

## 2018-03-21 MED ORDER — ROCURONIUM BROMIDE 100 MG/10ML IV SOLN
INTRAVENOUS | Status: DC | PRN
  Administered 2018-03-21: 50 mg via INTRAVENOUS
  Administered 2018-03-21 (×2): 20 mg via INTRAVENOUS
  Administered 2018-03-21: 10 mg via INTRAVENOUS

## 2018-03-21 MED ORDER — KCL IN DEXTROSE-NACL 20-5-0.45 MEQ/L-%-% IV SOLN
INTRAVENOUS | Status: DC
  Administered 2018-03-21 – 2018-03-22 (×2): via INTRAVENOUS
  Filled 2018-03-21 (×2): qty 1000

## 2018-03-21 MED ORDER — PROMETHAZINE HCL 25 MG/ML IJ SOLN: 6.2500 mg | INTRAMUSCULAR | Status: DC | PRN

## 2018-03-21 MED ORDER — MIDAZOLAM HCL 2 MG/2ML IJ SOLN
INTRAMUSCULAR | Status: AC
  Filled 2018-03-21: qty 2

## 2018-03-21 MED ORDER — PROPOFOL 10 MG/ML IV BOLUS
INTRAVENOUS | Status: AC
  Filled 2018-03-21: qty 40

## 2018-03-21 MED ORDER — ACETAMINOPHEN 500 MG PO TABS
1000.0000 mg | ORAL_TABLET | ORAL | Status: AC
  Administered 2018-03-21: 1000 mg via ORAL
  Filled 2018-03-21: qty 2

## 2018-03-21 MED ORDER — SUGAMMADEX SODIUM 500 MG/5ML IV SOLN
INTRAVENOUS | Status: AC
  Filled 2018-03-21: qty 5

## 2018-03-21 MED ORDER — SODIUM CHLORIDE 0.9 % IV SOLN
INTRAVENOUS | Status: DC | PRN
  Administered 2018-03-21: 08:00:00

## 2018-03-21 MED ORDER — GABAPENTIN 300 MG PO CAPS
300.0000 mg | ORAL_CAPSULE | ORAL | Status: AC
  Administered 2018-03-21: 300 mg via ORAL
  Filled 2018-03-21: qty 1

## 2018-03-21 MED ORDER — ACETAMINOPHEN 500 MG PO TABS
ORAL_TABLET | ORAL | Status: AC
  Filled 2018-03-21: qty 1

## 2018-03-21 MED ORDER — GABAPENTIN 300 MG PO CAPS
300.0000 mg | ORAL_CAPSULE | Freq: Two times a day (BID) | ORAL | Status: DC
  Administered 2018-03-21 – 2018-03-23 (×4): 300 mg via ORAL
  Filled 2018-03-21 (×4): qty 1

## 2018-03-21 MED ORDER — BUPIVACAINE HCL (PF) 0.25 % IJ SOLN
INTRAMUSCULAR | Status: DC | PRN
  Administered 2018-03-21: 20 mL

## 2018-03-21 MED ORDER — FENTANYL CITRATE (PF) 250 MCG/5ML IJ SOLN
INTRAMUSCULAR | Status: DC | PRN
  Administered 2018-03-21 (×3): 50 ug via INTRAVENOUS

## 2018-03-21 MED ORDER — SCOPOLAMINE 1 MG/3DAYS TD PT72
MEDICATED_PATCH | TRANSDERMAL | Status: AC
  Filled 2018-03-21: qty 1

## 2018-03-21 SURGICAL SUPPLY — 63 items
ADH SKN CLS APL DERMABOND .7 (GAUZE/BANDAGES/DRESSINGS) ×3
APL SKNCLS STERI-STRIP NONHPOA (GAUZE/BANDAGES/DRESSINGS) ×3
APPLIER CLIP 9.375 MED OPEN (MISCELLANEOUS) ×4
APR CLP MED 9.3 20 MLT OPN (MISCELLANEOUS) ×3
BAG DECANTER FOR FLEXI CONT (MISCELLANEOUS) ×4 IMPLANT
BENZOIN TINCTURE PRP APPL 2/3 (GAUZE/BANDAGES/DRESSINGS) ×2 IMPLANT
BLADE 10 SAFETY STRL DISP (BLADE) ×4 IMPLANT
BLADE CLIPPER SURG (BLADE) ×2 IMPLANT
BLADE SURG 10 STRL SS (BLADE) ×4 IMPLANT
CANISTER SUCT 3000ML PPV (MISCELLANEOUS) ×8 IMPLANT
CANISTER WOUND CARE 500ML ATS (WOUND CARE) ×2 IMPLANT
CHLORAPREP W/TINT 26ML (MISCELLANEOUS) ×6 IMPLANT
CLIP APPLIE 9.375 MED OPEN (MISCELLANEOUS) ×3 IMPLANT
COVER SURGICAL LIGHT HANDLE (MISCELLANEOUS) ×4 IMPLANT
DERMABOND ADVANCED (GAUZE/BANDAGES/DRESSINGS) ×1
DERMABOND ADVANCED .7 DNX12 (GAUZE/BANDAGES/DRESSINGS) ×7 IMPLANT
DRAIN CHANNEL 15F RND FF W/TCR (WOUND CARE) ×2 IMPLANT
DRAIN CHANNEL 19F RND (DRAIN) ×6 IMPLANT
DRAPE HALF SHEET 40X57 (DRAPES) ×6 IMPLANT
DRAPE INCISE IOBAN 66X45 STRL (DRAPES) ×2 IMPLANT
DRAPE ORTHO SPLIT 77X108 STRL (DRAPES) ×8
DRAPE SURG ORHT 6 SPLT 77X108 (DRAPES) ×10 IMPLANT
DRAPE WARM FLUID 44X44 (DRAPE) ×4 IMPLANT
DRSG PAD ABDOMINAL 8X10 ST (GAUZE/BANDAGES/DRESSINGS) ×8 IMPLANT
DRSG TEGADERM 4X4.75 (GAUZE/BANDAGES/DRESSINGS) ×2 IMPLANT
DRSG VAC ATS LRG SENSATRAC (GAUZE/BANDAGES/DRESSINGS) ×2 IMPLANT
ELECT BLADE 4.0 EZ CLEAN MEGAD (MISCELLANEOUS) ×4
ELECT BLADE 6.5 EXT (BLADE) ×4 IMPLANT
ELECT COATED BLADE 2.86 ST (ELECTRODE) ×8 IMPLANT
ELECTRODE BLDE 4.0 EZ CLN MEGD (MISCELLANEOUS) ×3 IMPLANT
EVACUATOR SILICONE 100CC (DRAIN) ×8 IMPLANT
GAUZE SPONGE 2X2 8PLY STRL LF (GAUZE/BANDAGES/DRESSINGS) ×1 IMPLANT
GAUZE SPONGE 4X4 12PLY STRL (GAUZE/BANDAGES/DRESSINGS) ×4 IMPLANT
GLOVE BIO SURGEON STRL SZ 6 (GLOVE) ×12 IMPLANT
GLOVE SURG SIGNA 7.5 PF LTX (GLOVE) ×4 IMPLANT
GOWN STRL REUS W/ TWL LRG LVL3 (GOWN DISPOSABLE) ×9 IMPLANT
GOWN STRL REUS W/ TWL XL LVL3 (GOWN DISPOSABLE) ×3 IMPLANT
GOWN STRL REUS W/TWL LRG LVL3 (GOWN DISPOSABLE) ×12
GOWN STRL REUS W/TWL XL LVL3 (GOWN DISPOSABLE) ×4
KIT BASIN OR (CUSTOM PROCEDURE TRAY) ×4 IMPLANT
KIT TURNOVER KIT B (KITS) ×4 IMPLANT
NDL HYPO 25GX1X1/2 BEV (NEEDLE) IMPLANT
NEEDLE HYPO 25GX1X1/2 BEV (NEEDLE) ×4 IMPLANT
NS IRRIG 1000ML POUR BTL (IV SOLUTION) ×8 IMPLANT
PACK GENERAL/GYN (CUSTOM PROCEDURE TRAY) ×4 IMPLANT
PAD ARMBOARD 7.5X6 YLW CONV (MISCELLANEOUS) ×12 IMPLANT
PEN SKIN MARKING BROAD (MISCELLANEOUS) ×4 IMPLANT
PENCIL SMOKE EVACUATOR (MISCELLANEOUS) ×2 IMPLANT
SPECIMEN JAR X LARGE (MISCELLANEOUS) ×4 IMPLANT
SPONGE GAUZE 2X2 STER 10/PKG (GAUZE/BANDAGES/DRESSINGS) ×1
SPONGE LAP 18X18 X RAY DECT (DISPOSABLE) ×4 IMPLANT
STAPLER VISISTAT 35W (STAPLE) ×8 IMPLANT
SUT ETHILON 2 0 FS 18 (SUTURE) ×8 IMPLANT
SUT MNCRL AB 3-0 PS2 18 (SUTURE) ×10 IMPLANT
SUT MNCRL AB 4-0 PS2 18 (SUTURE) ×4 IMPLANT
SUT PDS AB 2-0 CT2 27 (SUTURE) ×16 IMPLANT
SUT SILK 4 0 PS 2 (SUTURE) ×2 IMPLANT
SUT VIC AB 3-0 SH 18 (SUTURE) ×4 IMPLANT
SYR CONTROL 10ML LL (SYRINGE) ×4 IMPLANT
TOWEL OR 17X24 6PK STRL BLUE (TOWEL DISPOSABLE) ×4 IMPLANT
TRAY FOLEY MTR SLVR 14FR STAT (SET/KITS/TRAYS/PACK) ×4 IMPLANT
TUBE CONNECTING 12X1/4 (SUCTIONS) ×6 IMPLANT
YANKAUER SUCT BULB TIP NO VENT (SUCTIONS) ×4 IMPLANT

## 2018-03-21 NOTE — Interval H&P Note (Signed)
History and Physical Interval Note:  03/21/2018 7:12 AM  Tracy Howell  has presented today for surgery, with the diagnosis of ADVANCED LEFT BREAST CANCER  The various methods of treatment have been discussed with the patient and family.  Her husband is at the bedside.  After consideration of risks, benefits and other options for treatment, the patient has consented to  Procedure(s): LEFT MASTECTOMY WITH AXILLARY LYMPH NODE DISSECTION (Left) ADJACENT TISSUE TRANSFER LEFT CHEST AND AMBOMEN TO LEFT CHEST (Left) POSSIBLE SKIN GRAFT FROM LEFT THIGH TO LEFT CHEST (Left) POSSIBLE LATISSIMUS FLAP TO BREAST (Left) POSSIBLE APPLICATION OF A-CELL TO THIGH DONER SIGHT (Left) as a surgical intervention .  The patient's history has been reviewed, patient examined, no change in status, stable for surgery.  I have reviewed the patient's chart and labs.  Questions were answered to the patient's satisfaction.     Shann Medal

## 2018-03-21 NOTE — Op Note (Signed)
Operative Note   DATE OF OPERATION: 10.7.19  LOCATION: [facility]-inpatient  SURGICAL DIVISION: Plastic Surgery  PREOPERATIVE DIAGNOSES:  1. Left breast caner, metastatic, ER+  POSTOPERATIVE DIAGNOSES:  same  PROCEDURE:  Adjacent tissue transfer left abdomen to left chest 25 x 25 cm2 (thoraco abdominal flap to left chest)  SURGEON: Irene Limbo MD MBA  ASSISTANT: Idolina Primer RNFA  ANESTHESIA:  General.   EBL: 100 ml for entire procedure  COMPLICATIONS: None immediate.   INDICATIONS FOR PROCEDURE:  The patient, Tracy Howell, is a 72 y.o. female born on [birth year], is here for coverage following left mastectomy with anticipated open wound.   FINDINGS: Resection of lower half pectoralis completed, open wound prior to flap coverage 15 x 20 cm noted  DESCRIPTION OF PROCEDURE:  The patient's operative site was marked with the patient in the preoperative area. The patient was taken to the operating room. SCDs were placed and IV antibiotics were given. Foley catheter placed. The patient's operative site was prepped and draped in a sterile fashion. A time out was performed and all information was confirmed to be correct. Please refer to Dr. Pollie Friar note for description of mastectomy. I assisted to retraction and exposure.   A thoracoabdominal flap was designed adjacent to open wound extending from mastectomy wound down midline to nearly level of umbilicus, carried laterally to anterior axillary line. Incision made through skin and carried through superficial fascia. Flap elevated in subfascial plane. Wound irrigated and hemostasis obtained. 60 Fr JP placed in left chest wound and axilla. 98 Fr JP placed beneath abdominal flap. Both secured with 2-0 nylon.  The flap was rotated clockwise into defect and redundant skin lateral chest wall and axilla excised. Closure completed with 2-0 PDS in superficial fascia, 3-0 monocryl in dermis and skin closure with 4-0 monocryl. Total area flap and donor  site closure 25 x 25 cm. Incisional VAC applied and set to 125 mm Hg continuous.   The patient was allowed to wake from anesthesia, extubated and taken to the recovery room in satisfactory condition.   SPECIMENS: none  DRAINS: 15 Fr JP in subcutaneous abdomen, 19 Fr JP in subcutaneous left chest  Irene Limbo, MD [facility] Plastic & Reconstructive Surgery [phone], pin [phone]

## 2018-03-21 NOTE — Anesthesia Postprocedure Evaluation (Signed)
Anesthesia Post Note  Patient: Amiliah Campisi  Procedure(s) Performed: LEFT MASTECTOMY WITH AXILLARY LYMPH NODE DISSECTION (Left Breast) ADJACENT TISSUE TRANSFER LEFT CHEST AND ABDOMEN TO LEFT CHEST (Left Chest) APPLICATION OF WOUND VAC (Left Chest)     Patient location during evaluation: PACU Anesthesia Type: General Level of consciousness: awake and alert Pain management: pain level controlled Vital Signs Assessment: post-procedure vital signs reviewed and stable Respiratory status: spontaneous breathing, nonlabored ventilation and respiratory function stable Cardiovascular status: blood pressure returned to baseline and stable Postop Assessment: no apparent nausea or vomiting Anesthetic complications: no    Last Vitals:  Vitals:   03/21/18 1130 03/21/18 1145  BP:  133/70  Pulse: 93 86  Resp: 19 18  Temp:    SpO2: 93% 96%    Last Pain:  Vitals:   03/21/18 1145  TempSrc:   PainSc: [place]

## 2018-03-21 NOTE — Transfer of Care (Signed)
Immediate Anesthesia Transfer of Care Note  Patient: Tracy Howell  Procedure(s) Performed: LEFT MASTECTOMY WITH AXILLARY LYMPH NODE DISSECTION (Left Breast) ADJACENT TISSUE TRANSFER LEFT CHEST AND ABDOMEN TO LEFT CHEST (Left Chest) APPLICATION OF WOUND VAC (Left Chest)  Patient Location: PACU  Anesthesia Type:General  Level of Consciousness: awake, patient cooperative and responds to stimulation  Airway & Oxygen Therapy: Patient Spontanous Breathing and Patient connected to face mask oxygen  Post-op Assessment: Report given to RN, Post -op Vital signs reviewed and stable and Patient moving all extremities X 4  Post vital signs: Reviewed and stable  Last Vitals:  Vitals Value Taken Time  BP 142/79 03/21/2018 11:07 AM  Temp    Pulse 98 03/21/2018 11:10 AM  Resp 20 03/21/2018 11:10 AM  SpO2 99 % 03/21/2018 11:10 AM  Vitals shown include unvalidated device data.  Last Pain:  Vitals:   03/21/18 0631  TempSrc: Oral  PainSc:       Patients Stated Pain Goal: 3 (89/21/19 4174)  Complications: No apparent anesthesia complications

## 2018-03-21 NOTE — Progress Notes (Signed)
Breast cancer bag given and contents explained to pt.  Pt is a Marine scientist and understands about JPs. Given arm pillow for comfort and explained about arm precautions.

## 2018-03-21 NOTE — Plan of Care (Signed)
  Problem: Activity: Goal: Ability to maintain or regain function will improve Outcome: Progressing   Problem: Clinical Measurements: Goal: Postoperative complications will be avoided or minimized Outcome: Progressing   Problem: Self-Concept: Goal: Ability to verbalize positive feelings about self will improve Outcome: Progressing   Problem: Pain Management: Goal: Expressions of feelings of enhanced comfort will increase Outcome: Progressing   Problem: Skin Integrity: Goal: Demonstration of wound healing without infection will improve Outcome: Progressing

## 2018-03-21 NOTE — H&P (Addendum)
Subjective:     Patient ID: Tracy Howell is a 72 y.o. female.  HPI  Patient of Drs. Newman and Magrinat her for consultation regarding anticipated soft tissue defect following mastectomy. Histoy left breast cancer and underwent left lumpectomy in [place] on 2012 for a 2.5 cm, grade 2 breast cancer involving one lymph node of 5 sampled. No additional adjuvant treatment.  Onset new palpable mass 2018. Diagnosis of recurrence untimately made 03/2017, with initial imaging demonstrating  6 cm irregular mass. The left axilla was sonographically benign.  Biopsy showed IDC, ER/PR+.  Completed chemotherapy 01/11/2018 and stared on anastrazole. Continues on Herceptin. Staging studies with metastatic disease to lungs.  Plan MRM. She had additional skin biopsy with Dr. Lucia Gaskins last week- this demonstrated IDC.  Patient notes mother had bilateral radical mastectomies.  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 She lives in [place], [place], and spends 2 weeks here every 2 months at her job. She has been living with Dr. Criss Rosales throughout her treatment and plans to do so post surgery.  Review of Systems  Constitutional: Positive for unexpected weight change.  HENT: Positive for sinus pain.   Eyes: Positive for discharge, itching and visual disturbance.  Cardiovascular: Positive for leg swelling.  Endocrine: Positive for cold intolerance and heat intolerance.  Allergic/Immunologic: Positive for food allergies.  Neurological: Positive for numbness.  Psychiatric/Behavioral: The patient is nervous/anxious.    Remainder 12 point review negative    Objective:   Physical Exam  Constitutional: She is oriented to person, place, and time.  CV: normal heart sounds Pulm: clear to ausculatation Abdominal: Soft.  Redundant soft tissue  Lymphadenopathy:    She has no axillary adenopathy.  Neurological: She is alert and oriented to person, place, and time.    Right breast benign Left breast indurated- area at 12 o clock from recent biopsy noted. There is additional area superior and medial to this that is concerning for skin lesion, entire breast involved     Assessment:     Recurrent left breast cancer, metastatic, triple positive    Plan:     Review of chart with significant response to chemotherapy on included pictures. However still disease that spans entire left breast >16 x 16 cm. Reviewed anticipated soft tissue defect. With regards to coverage would try to maximize local flaps from abdomen and lateral chest to reduce wound size and skin graft to any remaining open areas. Reviewed donor site graft use of VAC over graft. If any areas where pectoralis resected may require latissimus flap coverage of chest wall. I do no feel a LD flap alone would be able to provide complete skin coverage and would still require additional grafting.  If may be difficult to obtain clear margins given skin involvement, but we discussed one consideration is staging resection and closure to assess for this.   We discussed TRAM as option for coverage- though she has significant redundant tissue her weight would place her high risk abdominal and intra flap complications. My goal would be to limit morbidities from this surgery as well as provide coverage. Skin flap or pedicled flap would withstand any adjuvant radiation better than STSG, latter may be subject to break down.  Reviewed post op she should plan to remain in town for first several weeks as she will have drains, VAC, need for local wound care. Discussed possible A Cell matrix to skin graft donor site, reviewed porcine source of this. Patient does not eat pork,  declines use of porcine products; no A Cell application.   Irene Limbo, MD [facility] Plastic & Reconstructive Surgery [phone], pin [phone]

## 2018-03-21 NOTE — Anesthesia Procedure Notes (Addendum)
Anesthesia Regional Block: Pectoralis block   Pre-Anesthetic Checklist: ,, timeout performed, Correct Patient, Correct Site, Correct Laterality, Correct Procedure, Correct Position, site marked, Risks and benefits discussed, pre-op evaluation,  At surgeon's request and post-op pain management  Laterality: Left  Prep: Maximum Sterile Barrier Precautions used, chloraprep       Needles:  Injection technique: Single-shot  Needle Type: Echogenic Stimulator Needle     Needle Length: 9cm  Needle Gauge: 21     Additional Needles:   Procedures:,,,, ultrasound used (permanent image in chart),,,,  Narrative:  Start time: 03/21/2018 7:22 AM End time: 03/21/2018 7:24 AM Injection made incrementally with aspirations every 5 mL.  Performed by: Personally  Anesthesiologist: Brennan Bailey, MD  Additional Notes: Risks, benefits, and alternative discussed. Patient gave consent for procedure. Patient prepped and draped in sterile fashion. Sedation administered, patient remains easily responsive to voice. Relevant anatomy identified with ultrasound guidance. Local anesthetic given in 5cc increments with no signs or symptoms of intravascular injection. No pain or paraesthesias with injection. Patient monitored throughout procedure with signs of LAST or immediate complications. Tolerated well. Ultrasound image placed in chart.  Tawny Asal, MD

## 2018-03-21 NOTE — Anesthesia Procedure Notes (Signed)
Procedure Name: Intubation Date/Time: 03/21/2018 8:39 AM Performed by: Glynda Jaeger, CRNA Pre-anesthesia Checklist: Patient identified, Patient being monitored, Timeout performed, Emergency Drugs available and Suction available Patient Re-evaluated:Patient Re-evaluated prior to induction Oxygen Delivery Method: Circle System Utilized Preoxygenation: Pre-oxygenation with 100% oxygen Induction Type: IV induction Ventilation: Mask ventilation without difficulty Laryngoscope Size: Mac and 4 Grade View: Grade II Tube type: Oral Tube size: 7.5 mm Number of attempts: 1 Airway Equipment and Method: Stylet Placement Confirmation: ETT inserted through vocal cords under direct vision,  positive ETCO2 and breath sounds checked- equal and bilateral Secured at: 22 cm Tube secured with: Tape Dental Injury: Teeth and Oropharynx as per pre-operative assessment

## 2018-03-21 NOTE — Op Note (Signed)
03/21/2018  9:22 AM  PATIENT:  Tracy Howell, 72 y.o., female, MRN: [id number]  PREOP DIAGNOSIS:  ADVANCED LEFT BREAST CANCER  POSTOP DIAGNOSIS:   Advanced left breast cancer, involving skin and pectoralis major muscle  PROCEDURE:   Procedure(s):  LEFT MASTECTOMY WITH AXILLARY LYMPH NODE DISSECTION, excision of the lower half of the pectoralis major muscle  SURGEON:   Alphonsa Overall, M.D.  ASSISTANT:   B. Iran Planas, M.D.  ANESTHESIA:   general  Anesthesiologist: Brennan Bailey, MD CRNA: Glynda Jaeger, CRNA; White, Amedeo Plenty, Contractor  ASA:  2  EBL:  150  ml  BLOOD ADMINISTERED: none  DRAINS: none   LOCAL MEDICATIONS USED:   Left pectoral blcok by anesthesia  SPECIMEN:   Left breast and axillary contents (suture lateral), additional left axillary contents, pectoralis muscle biopsy (metastatic breast ca on frozen section), lower half of pectoralis muscle (long suture cranial and short suture lateral)  COUNTS CORRECT:  YES  INDICATIONS FOR PROCEDURE:  Tracy Howell is a 72 y.o. (DOB: 1946/05/31) AA female whose primary care physician is Lucianne Lei, MD and comes for left modified radical mastectomy.   She has an advanced left breast cancer with nodule in the skin only 3 cm below her clavicle.  She has completed neoadjuvant chemotherapy by Dr. Jana Hakim with a partial response of the left breast cancer.  She is to get a left modified radical mastectomy with Dr. Iran Planas assisting in coverage of the left chest wall defect.   The indications and risks of the surgery were explained to the patient.  The risks include, but are not limited to, infection, bleeding, and nerve injury.  OPERATIVE NOTE;  The patient was taken to OR room # 9 at [facility] where she underwent a general anesthesia  supervised by Anesthesiologist: Brennan Bailey, MD CRNA: Glynda Jaeger, CRNA; White, Amedeo Plenty, Immunologist. Her left breast, left axilla,  abdomen, and left leg were prepped with ChloraPrep, Betadine, and sterilely draped.    A time-out and the surgical check list was reviewed.    I made an incision in the right breast which included the areola, to about 2 cm below the clavicle, and about 3 cm above the inframammary fold in the left breast.  I had to make the skin incision this large to include all the palpable nodules in the skin.  There was not much in the way of skin flap development.  But I developed skin flaps medially to the lateral edge of the sternum, inferiorly to the investing fascia of the rectus abdominus muscle, laterally to the anterior edge of the latissimus dorsi muscle, and superiorly to just below the clavicle.  The breast was reflected off the pectoralis muscle from medial to lateral.     I dissected into the left axilla.  I identified the left axillary vein.  I divided at least two tributaries off the left axillary vein.  I identified the long thoracic nerve of Bell and the thoracodorsal nerve and tried to spare these during the dissection.  The left axilla was fibrous and had some small palpable nodes.  I dissected out the nodes underneath the pectoralis minor muscle.   The left axillary contents and left breast were sent as a en block specimen.  I took some additional axillary fat that I think had some small nodes in it.  I labeled this as additional left axillary contents.     She had some nodularity on the  lower half of the pectoralis muscle.  A frozen section of this was obtained and was positive for metastatic breast cancer.  I then removed the lower 1/2 of the pectoralis muscle (approximately 10 x 13 cm).  I placed a long suture cranial and a short suture lateral.  I placed clips along the edge of the pectoralis muscle that was cut for future diagnostic tests and radiation therapy.   At this point, the patient is doing well and all gross breast cancer is removed.   I have a surgeon, Dr. Iran Planas, as a first  assist to retract, expose, and assist on this difficult operation.  She then started the left chest wall reconstruction and will dictate this part of the operation.   Alphonsa Overall, MD, [facility] Surgery Pager: [phone] Office phone:  [phone]

## 2018-03-22 ENCOUNTER — Encounter (HOSPITAL_COMMUNITY): Payer: Self-pay | Admitting: Surgery

## 2018-03-22 LAB — CBC
HCT: 30 % — ABNORMAL LOW (ref 36.0–46.0)
HEMOGLOBIN: 9.4 g/dL — AB (ref 12.0–15.0)
MCH: 30.5 pg (ref 26.0–34.0)
MCHC: 31.3 g/dL (ref 30.0–36.0)
MCV: 97.4 fL (ref 80.0–100.0)
Platelets: 208 10*3/uL (ref 150–400)
RBC: 3.08 MIL/uL — AB (ref 3.87–5.11)
RDW: 13.9 % (ref 11.5–15.5)
WBC: 9.4 10*3/uL (ref 4.0–10.5)

## 2018-03-22 LAB — BASIC METABOLIC PANEL
Anion gap: 7 (ref 5–15)
BUN: 12 mg/dL (ref 8–23)
CHLORIDE: 104 mmol/L (ref 98–111)
CO2: 24 mmol/L (ref 22–32)
CREATININE: 0.85 mg/dL (ref 0.44–1.00)
Calcium: 8.6 mg/dL — ABNORMAL LOW (ref 8.9–10.3)
GFR calc non Af Amer: >60 mL/min (ref >60)
Glucose, Bld: 160 mg/dL — ABNORMAL HIGH (ref 70–99)
Potassium: 4.2 mmol/L (ref 3.5–5.1)
SODIUM: 135 mmol/L (ref 135–145)

## 2018-03-22 MED ORDER — TRIAMTERENE-HCTZ 37.5-25 MG PO TABS
1.0000 | ORAL_TABLET | Freq: Every day | ORAL | Status: DC
  Administered 2018-03-22 – 2018-03-23 (×2): 1 via ORAL
  Filled 2018-03-22 (×2): qty 1

## 2018-03-22 NOTE — Care Management Note (Signed)
Case Management Note  Patient Details  Name: Tracy Howell MRN: [id number] Date of Birth: [birth year]  Subjective/Objective:                    Action/Plan:  Will continue to follow for discharge needs. Per MD note VAC will be discontinued prior to discharge home 03-23-18. Therefore, home VAC application not started. Expected Discharge Date:                  Expected Discharge Plan:  Home/Self Care  In-House Referral:     Discharge planning Services  CM Consult  Post Acute Care Choice:    Choice offered to:  Patient  DME Arranged:  N/A DME Agency:  NA  HH Arranged:  NA HH Agency:  NA  Status of Service:  In process, will continue to follow  If discussed at Long Length of Stay Meetings, dates discussed:    Additional Comments:  Marilu Favre, RN 03/22/2018, 9:53 AM

## 2018-03-22 NOTE — Progress Notes (Signed)
   Plastic Surgery POD#1 left mastectomy, thoracoabdominal flap to chest  Temp:  [97.5 F (36.4 C)-99.2 F (37.3 C)] 98.3 F (36.8 C) (10/08 0600) Pulse Rate:  [82-109] 102 (10/08 0600) Resp:  [16-21] 20 (10/08 0600) BP: (101-142)/(55-82) 114/55 (10/08 0600) SpO2:  [93 %-100 %] 97 % (10/08 0600)  JP 168/148 VAC incisional no output PO 1410  Patient alert, good spirits, has been out of bed  PE Chest flat soft, incision VAC in place JPs watery serosanguinous  Left axillary fullness is expected/normal soft tissue as part of her complex closure  A/P Tried one tramadol this am, so far tolerating. Ambulate Plan target d/c 10.9.19 and will remove VAC over incision at that time. Ok to shower following this.  Irene Limbo, MD [facility] Plastic & Reconstructive Surgery [phone], pin [phone]

## 2018-03-23 MED ORDER — TRAMADOL HCL 50 MG PO TABS: 50.0000 mg | ORAL_TABLET | Freq: Four times a day (QID) | ORAL | 0 refills | Status: DC | PRN

## 2018-03-23 NOTE — Progress Notes (Signed)
[facility] Office:  [phone] General Surgery Progress Note   LOS: 2 days  POD -  2 Days Post-Op  Chief Complaint: Advance left breast cancer  Assessment and Plan: 1.  LEFT MASTECTOMY WITH AXILLARY LYMPH NODE DISSECTION, resection of about 1/2 of pectoralis major muscle. ADJACENT TISSUE TRANSFER LEFT CHEST AND ABDOMEN TO LEFT CHEST APPLICATION OF WOUND VAC - 03/21/2018 - Ajla Mcgeachy/Thimmappa  Final path pending.  She anticipates going home today.  Drain and wound follow-up per Dr. Iran Planas.  I'll see her back in 2 to 3 weeks.  2.  DVT prophylaxis - Lovenox   Active Problems:   Breast cancer, left breast (HCC)  Subjective:  Doing well.  Not much pain.  Eating well.  Has charged drains.  Husband in room.  Objective:   Vitals:   03/22/18 2056 03/23/18 0534  BP: (!) 113/58 100/65  Pulse: 97 97  Resp: 18 18  Temp: 98.5 F (36.9 C) 98.7 F (37.1 C)  SpO2: 97% 100%     Intake/Output from previous day:  10/08 0701 - 10/09 0700 In: 1180.7 [P.O.:510; I.V.:670.7] Out: 195 [Drains:195]  Intake/Output this shift:  No intake/output data recorded.   Physical Exam:   General: WN AA F who is alert and oriented.    HEENT: Normal. Pupils equal. .   Lungs: Clear   Left chest:  Skin looks good.  Not much in VAC drain.  Drains 1/2 - 165/30 cc last 24 hours   Lab Results:    Recent Labs    03/22/18 0441  WBC 9.4  HGB 9.4*  HCT 30.0*  PLT 208    BMET   Recent Labs    03/22/18 0441  NA 135  K 4.2  CL 104  CO2 24  GLUCOSE 160*  BUN 12  CREATININE 0.85  CALCIUM 8.6*    PT/INR  No results for input(s): LABPROT, INR in the last 72 hours.  ABG  No results for input(s): PHART, HCO3 in the last 72 hours.  Invalid input(s): PCO2, PO2   Studies/Results:  No results found.   Anti-infectives:   Anti-infectives (From admission, onward)   Start     Dose/Rate Route Frequency Ordered Stop   03/21/18 0811  bacitracin 50,000 Units, gentamicin (GARAMYCIN)  80 mg, ceFAZolin (ANCEF) 1 g in sodium chloride 0.9 % 1,000 mL  Status:  Discontinued       As needed 03/21/18 0812 03/21/18 1105   03/21/18 0600  ceFAZolin (ANCEF) IVPB 2g/100 mL premix     2 g 200 mL/hr over 30 Minutes Intravenous On call to O.R. 03/21/18 0544 03/21/18 0741      Alphonsa Overall, MD, FACS Pager: [facility] Office: [phone] 03/23/2018

## 2018-03-23 NOTE — Discharge Summary (Signed)
Physician Discharge Summary  Patient ID: Tracy Howell MRN: [id number] DOB/AGE: 1946-06-01 72 y.o.  Admit date: 03/21/2018 Discharge date: 03/23/2018  Admission Diagnoses: Left breast cancer  Discharge Diagnoses:  Active Problems:   Breast cancer, left breast [facility])   Discharged Condition: stable  Hospital Course: Postoperatively patient did well with minimal pain, ambulating with minimal assist and tolerating diet. Incision VAC removed on POD#2 with viable flap and all margins intact. Continue drains.  Treatments: surgery: left mastectomy, thoracoabdominal flap 10.7.19  Discharge Exam: Blood pressure 100/65, pulse 97, temperature 98.7 F (37.1 C), temperature source Oral, resp. rate 18, height 5' 6.5" (1.689 m), weight 102.5 kg, SpO2 100 %. Incision/Wound: incisions intact with scant drainage, expected edema left axilla, drains serosanguinous, chest flat flap viable  Disposition: Discharge disposition: 01-Home or Self Care       Discharge Instructions    Call MD for:  redness, tenderness, or signs of infection (pain, swelling, bleeding, redness, odor or green/yellow discharge around incision site)   Complete by:  As directed    Call MD for:  temperature >100.5   Complete by:  As directed    Discharge instructions   Complete by:  As directed    Ok to remove dressings and shower am 10.9.19. Soap and water ok, pat incisions dry. No creams or ointments over incisions. Do not let drains dangle in shower, attach to lanyard or similar.Strip and record drains twice daily and bring log to clinic visit. Dry dressing as needed over incisions.  Breast binder or soft compression bra for comfort, may leave open to air.  Ok to raise arms above shoulders for bathing and dressing.  No house yard work or exercise until cleared by MD. Keep LUE elevated to reduce swelling.  Ok to use tylenol and Motrin as directed to aid with pain control.   Driving Restrictions   Complete by:  As  directed    No driving for 2 weeks   Lifting restrictions   Complete by:  As directed    No lifting > 5 lbs with LUE until cleared by MD   Resume previous diet   Complete by:  As directed      Allergies as of 03/23/2018      Reactions   Onion Anaphylaxis, Nausea And Vomiting, Swelling   Throat swelling.   Other Anaphylaxis, Nausea And Vomiting   Green Peppers-anaphylactic, nausea/vomiting/swelling Mushrooms-nausea/vomiting. Patient has hereditary angioedema (HAE)   Penicillins Rash, Other (See Comments)   Vomiting; has tolerated oral cephalosporins > intolerance PATIENT HAS HAD A PCN REACTION WITH IMMEDIATE RASH, FACIAL/TONGUE/THROAT SWELLING, SOB, OR LIGHTHEADEDNESS WITH HYPOTENSION:  #  #  YES  #  #  HAS PT DEVELOPED SEVERE RASH INVOLVING MUCUS MEMBRANES or SKIN NECROSIS: #  #  YES  #  #  Has patient had a PCN rxn that required hospitalization:Was inpatient when reaction occurred. Has patient had a PCN reaction occurring within the last 10 years: No   Adhesive [tape]    Bruises skin    Aleve [naproxen] Other (See Comments)   bleeding   Anastrozole    Talking out of my head, hot flashes, body felt weird    Codeine Nausea And Vomiting   Morphine And Related Nausea And Vomiting   Neulasta [pegfilgrastim]    Muscle and bone pain      Medication List    TAKE these medications   B-complex with vitamin C tablet Take 1 tablet by mouth daily as needed (immune support).  cholecalciferol 1000 units tablet Commonly known as:  VITAMIN D Take 1,000 Units by mouth at bedtime.   COD LIVER OIL PO Take 1 capsule by mouth daily as needed (immune support).   LORazepam 0.5 MG tablet Commonly known as:  ATIVAN Take 1 tablet (0.5 mg total) by mouth at bedtime as needed for anxiety.   multivitamin with minerals Tabs tablet Take 1 tablet by mouth at bedtime.   traMADol 50 MG tablet Commonly known as:  ULTRAM Take 1 tablet (50 mg total) by mouth every 6 (six) hours as needed (mild  pain).   triamterene-hydrochlorothiazide 37.5-25 MG capsule Commonly known as:  DYAZIDE Take 1 each (1 capsule total) by mouth daily. What changed:    when to take this  reasons to take this   VITAMIN C PO Take 1 tablet by mouth daily as needed (immune support).      Follow-up Information    Irene Limbo, MD In 1 week.   Specialty:  Plastic Surgery Why:  as scheduled Contact information: [address] [place] [place] [address] [phone]        Alphonsa Overall, MD. Schedule an appointment as soon as possible for a visit in 2 week(s).   Specialty:  General Surgery Contact information: [address] [place] [address] [phone]           Signed: Irene Limbo 03/23/2018, 7:41 AM

## 2018-04-04 NOTE — Progress Notes (Signed)
Location of Breast Cancer: Left Breast, Left Axilla  Histology per Pathology Report:  03/30/17 Diagnosis Breast, left, needle core biopsy, 11:00 o'clock - INVASIVE DUCTAL CARCINOMA, GRADE III.  Receptor Status: ER(100%), PR(90%), Ki67- (70%), Her2 (POS).  06/24/17 Diagnosis Lymph node, needle/core biopsy, right axilla - METASTATIC CARCINOMA, SEE COMMENT.  Receptor Status: ER(100%), PR(90%), Her2-neu (POS),  02/02/18 Diagnosis Breast, left, needle core biopsy - INVASIVE DUCTAL CARCINOMA INVOLVING THE DERMIS  Receptor Status: ER(80%), PR (40%), Her2-neu (), Ki-(15%)  03/21/18 Diagnosis 1. Muscle biopsy, Pectoralis - INVASIVE DUCTAL CARCINOMA. 2. Breast, simple mastectomy, Left - INVASIVE DUCTAL CARCINOMA, GRADE 3, SPANNING 8.5 CM. - HIGH GRADE DUCTAL CARCINOMA IN SITU. - TUMOR INVOLVES SKELETAL MUSCLE, NIPPLE, AND VERY FOCALLY THE EPIDERMIS. - INVASIVE CARCINOMA INVOLVES THE POSTERIOR MARGIN. - RESECTION MARGINS ARE NEGATIVE FOR IN SITU CARCINOMA. - SEE ONCOLOGY TABLE. 3. Lymph nodes, regional resection, Left axillary contents - BENIGN FIBROADIPOSE TISSUE. 4. Muscle, resection, pectoralis - INVASIVE DUCTAL CARCINOMA. - PRESENT AT THE ANTERIOR, POSTERIOR, SUPERIOR, AND LATERAL MARGINS.   Did patient present with symptoms or was this found on screening mammography?:  Per Dr. Virgie Dad note 03/18/18: Sometime in April 2018 she noted a new mass in the left breast. She did not immediately bring it to medical attention but as it continued to grow she mentioned her to her primary care physician and on 03/29/2017 Tracy Howell underwent bilateral diagnostic mammography with tomography and left breast ultrasonography at [facility]  Past/Anticipated interventions by surgeon, if any: 03/21/18 PROCEDURE:   Procedure(s):  LEFT MASTECTOMY WITH AXILLARY LYMPH NODE DISSECTION, excision of the lower half of the pectoralis major muscle SURGEON:   Alphonsa Overall, M.D.  03/21/18 PROCEDURE:  Adjacent  tissue transfer left abdomen to left chest 25 x 25 cm2 (thoraco abdominal flap to left chest) SURGEON: Irene Limbo MD MBA  Past/Anticipated interventions by medical oncology, if any:  Dr. Jana Hakim 03/18/18 -(1) status post left lumpectomy in 2012 for a reported pT2 pN1 breast cancer, the patient refusing adjuvant treatment (no chemotherapy, radiation, or antiestrogens). METASTATIC DISEASE: December 2018 (bilateral triple positive disease) -(2) status post left breast biopsy 03/30/2017 for a clinical T4 N0, stage IIIB invasive ductal carcinoma, grade 3, triple positive, with an MIB-1 of 70%             (a) staging studies 05/21/2017 show multiple bilateral pulmonary nodules consistent with stage IV disease 05/21/2017; there are no liver or bone lesions noted             (b) breast MRI 05/13/2017 shows a 7 cm area of non-masslike enhancement in the right breast, with biopsy ([id number]) on 06/24/2017 showing: Metastatic carcinoma. Prognostic panel was strongly ER and PR positive as well as HER-2 positive with the signals ratio of 2.61 and number per cell 5.35 (triple positive)             (c) left breast skin biopsy 02/02/2018 confirms triple positive breast cancer -(3) neoadjuvant chemotherapy with T-DM1 started 06/29/2016             (a) echocardiogram 06/24/2017 showed an ejection fraction of 60-65% range             (b) echocardiogram 09/23/2017 showed an ejection fraction of 65-70% range             (c) CT chest on 10/12/2017 shows progression in nodes, pulmonary nodules, and bone mets   -(4) definitive surgery 03/21/2018  -(5) anastrozole written on 09/21/2017, started 02/13/2018, discontinued after 2 weeks with visual changes             (  a) patient notes inability to tolerate tamoxifen in the past (nausea).  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  She is going to need some uncal plasty and skin grafting.  We will review the pathology when she returns to see me 04/08/2018.  She was unable to tolerate anastrozole.  We will likely try exemestane.  If she cannot tolerate exemestane we will go to fulvestrant.  Assuming all goes well the plan will be to continue antiestrogens and anti-HER-2 treatment indefinitely.  She will need complete restaging studies sometime 6 to 12 months from now  She knows to call for any other issues that may develop before the next visit.  Lymphedema issues, if any: She denies    Pain issues, if any:  She reports discomfort at her surgery site.   SAFETY ISSUES:  Prior radiation? No  Pacemaker/ICD? No  Possible current pregnancy? No  Is the patient on methotrexate? No  Current Complaints / other details:   She still has a JP drain in place, to be removed Thursday.    BP (!) 153/75 (BP Location: Right Arm)   Pulse 75   Temp 98.5 F (36.9 C) (Oral)   Wt 226 lb 12.8 oz (102.9 kg)   SpO2 100% Comment: room air  BMI 36.06 kg/m    Wt Readings from Last 3 Encounters:  04/05/18 226 lb 12.8 oz (102.9 kg)  03/21/18 226 lb (102.5 kg)  03/18/18 227 lb 9.6 oz (103.2 kg)   Tracy Howell, [name], RN 04/04/2018,1:37 PM

## 2018-04-05 ENCOUNTER — Encounter: Payer: Self-pay | Admitting: Radiation Oncology

## 2018-04-05 ENCOUNTER — Other Ambulatory Visit: Payer: Self-pay

## 2018-04-05 ENCOUNTER — Ambulatory Visit
Admission: RE | Admit: 2018-04-05 | Discharge: 2018-04-05 | Disposition: A | Discharge status: Home / Self Care | Payer: Medicare Other | Source: Ambulatory Visit | Attending: Radiation Oncology | Admitting: Radiation Oncology

## 2018-04-05 VITALS — BP 153/75 | HR 75 | Temp 98.5°F | Wt 226.8 lb

## 2018-04-05 DIAGNOSIS — Z170 Estrogen receptor positive status [ER+]: Secondary | ICD-10-CM | POA: Insufficient documentation

## 2018-04-05 DIAGNOSIS — Z880 Allergy status to penicillin: Secondary | ICD-10-CM | POA: Diagnosis not present

## 2018-04-05 DIAGNOSIS — Z79899 Other long term (current) drug therapy: Secondary | ICD-10-CM | POA: Diagnosis not present

## 2018-04-05 DIAGNOSIS — C50212 Malignant neoplasm of upper-inner quadrant of left female breast: Secondary | ICD-10-CM

## 2018-04-05 DIAGNOSIS — C7951 Secondary malignant neoplasm of bone: Secondary | ICD-10-CM | POA: Insufficient documentation

## 2018-04-05 DIAGNOSIS — Z885 Allergy status to narcotic agent status: Secondary | ICD-10-CM | POA: Insufficient documentation

## 2018-04-05 DIAGNOSIS — Z886 Allergy status to analgesic agent status: Secondary | ICD-10-CM | POA: Diagnosis not present

## 2018-04-05 DIAGNOSIS — Z9012 Acquired absence of left breast and nipple: Secondary | ICD-10-CM | POA: Diagnosis not present

## 2018-04-05 DIAGNOSIS — Z888 Allergy status to other drugs, medicaments and biological substances status: Secondary | ICD-10-CM | POA: Diagnosis not present

## 2018-04-05 DIAGNOSIS — C773 Secondary and unspecified malignant neoplasm of axilla and upper limb lymph nodes: Secondary | ICD-10-CM | POA: Diagnosis not present

## 2018-04-05 NOTE — Progress Notes (Signed)
Radiation Oncology         [id number]) [phone] ________________________________  Name: Tracy Howell MRN: [id number]  Date: 04/05/2018  DOB: [birth year]  Follow-Up Visit Note  Outpatient  CC: Lucianne Lei, MD  Irene Limbo, MD  Diagnosis:      ICD-10-CM   1. Malignant neoplasm of upper-inner quadrant of left breast in female, estrogen receptor positive ([place]) C50.212    Z17.0    Cancer Staging Malignant neoplasm of upper-inner quadrant of left breast in female, estrogen receptor positive ([place]) Staging form: Breast, AJCC 8th Edition - Clinical stage from 05/12/2017: Stage IIIB (cT4b, cN0, cM0, G3, ER: Positive, PR: Positive, HER2: Positive) - Signed by Eppie Gibson, MD on 05/12/2017 Staging comments: Staged at breast conference on 11.28.18 - Pathologic: No Stage Recommended (ypT4a, pN2a, cM1, G3, ER+, PR+, HER2+) - Signed by Eppie Gibson, MD on 04/05/2018   CHIEF COMPLAINT: Here to discuss management of metastatic left breast cancer  Narrative:  The patient returns today for follow-up.   Since consultation date, she underwent the following imaging (dates and results as follows): Bilateral Breast MRI (05/12/2017): 7 x 3.5 x 5 cm non mass-like enhancement within the upper-outer right breast and focally thickened right axillary lymph node. Diffuse abnormal enhancement throughout the left breast with skin thickening/enhancement compatible with diffuse multicentric left breast malignancy and dermal involvement. Mildly enlarged lymph nodes posterior to the left pectoralis minor muscle and mildly enlarged left internal mammary lymph node, highly suspicious for metastatic disease. Probable 1 cm left upper lobe pulmonary nodule - metastasis not excluded.  CT C/A/P (05/21/2017): Multifocal pulmonary nodules are noted in both lungs, worrisome for metastatic disease. Prominent bilateral axillary lymph nodes measuring up to 1 cm. Post-treatment changes noted involving the left breast.    Bone Scan (05/21/2017): Scattered, likely degenerative type uptake throughout. Mottled appearance of uptake at the distal left femur, potentially artifact though could also be seen with hypertrophic osteoarthropathy and with metastasis.  CT Chest (10/12/2017): Interval progression of disease. Increase in size of nodal metastasis within the mediastinum bilateral axilla and left supraclavicular region. Increase in size of bilateral pulmonary nodules. Progression of lytic bone metastasis - New expansile lytic lesion involving the posterior aspect of the right 7th rib measures 2.7 cm; lytic lesion involving the posterior aspect of T12 is identified measuring 1.5 cm, previously 1.3 cm.  Breast or nodal biopsies, since consultation, involved (dates and results as follows): Biopsy of right axillary lymph node (06/24/2017): Metastatic carcinoma, triple positive; the morphologic appearance is similar to previously diagnosed invasive mammary carcinoma, consistent with metastatic carcinoma of breast primary. Biopsy of the left breast (02/02/2018): Invasive ductal carcinoma involving the dermis, triple positive.  Systemic therapy, if applicable, involved (dates and therapy as follows): Neoadjuvant chemotherapy with T-DM1 started 06/29/2016.  Neoadjuvant chemotherapy with carboplatin, gemcitabine, and trastuzumab (11/01/2017 - 01/18/2018). Anastrozole started 02/13/2018, discontinued after 2 weeks due to visual changes. Trastuzumab to be continued indefinitely.   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 Tumor involved skeletal muscle, nipple, and very focally the epidermis. Invasive carcinoma involved the posterior margin. Resection margins to 5 mm were negative for in situ carcinoma. 6/8 lymph nodes were positive. ER: positive, PR: positive, Her2: positive. Resection of the  pectoralis muscle revealed invasive ductal carcinoma, present at the anterior, posterior, superior, and lateral margins. She underwent flap reconstruction at the time of mastectomy. She still has  a JP drain in place, to be removed on Thursday 10/24.  She lives part-time in [place] but comes up to [facility] for weeks at a time for her oncologic treatments. She is willing to be here in [place] for a month and a half to receive her radiation therapy. She denies any lymphedema in her arms and has good ROM in her shoulders. She denies any post-operative pain but does report the sensation of a phantom nipple.         ALLERGIES:  is allergic to onion; other; penicillins; adhesive [tape]; aleve [naproxen]; anastrozole; codeine; morphine and related; and neulasta [pegfilgrastim].  Meds: Current Outpatient Medications  Medication Sig Dispense Refill  . Ascorbic Acid (VITAMIN C PO) Take 1 tablet by mouth daily as needed (immune support).     . B Complex-C (B-COMPLEX WITH VITAMIN C) tablet Take 1 tablet by mouth daily as needed (immune support).     . cholecalciferol (VITAMIN D) 1000 units tablet Take 1,000 Units by mouth at bedtime.    . lidocaine-prilocaine (EMLA) cream APP EXT AA 1 TIME    . Multiple Vitamin (MULTIVITAMIN WITH MINERALS) TABS tablet Take 1 tablet by mouth at bedtime.     . COD LIVER OIL PO Take 1 capsule by mouth daily as needed (immune support).     . LORazepam (ATIVAN) 0.5 MG tablet Take 1 tablet (0.5 mg total) by mouth at bedtime as needed for anxiety. (Patient not taking: Reported on 04/05/2018) 20 tablet 0  . triamterene-hydrochlorothiazide (DYAZIDE) 37.5-25 MG capsule Take 1 each (1 capsule total) by mouth daily. (Patient not taking: Reported on 04/05/2018) 30 capsule 0   No current facility-administered medications for this encounter.     Physical Findings:  weight is 226 lb 12.8 oz (102.9 kg). Her oral temperature is 98.5 F (36.9 C). Her blood pressure is 153/75 (abnormal) and her pulse is  75. Her oxygen saturation is 100%.    General: Alert and oriented, in no acute distress. Extremities:  She has good ROM in her shoulders. Breast exam reveals: Post-mastectomy surgical scars are extensive, healing well over the left chest and upper abdomen. JP drain still in place  Lab Findings: Lab Results  Component Value Date   WBC 9.4 03/22/2018   HGB 9.4 (L) 03/22/2018   HCT 30.0 (L) 03/22/2018   MCV 97.4 03/22/2018   PLT 208 03/22/2018      Radiographic Findings: As above, I reviewed her images  Impression/Plan: Metastatic breast cancer with locally advanced disease, at high risk for recurrence/progression in the post-mastectomy surgical bed and regional nodes which could be quite morbid  I personally reviewed her imaging which showed visceral and bony metastases. Repeat restaging scans will take place at Dr. Virgie Dad discretion. She understands that radiotherapy to the left chest wall and nodes would not improve her life expectancy but could reduce the risk of morbidity from local recurrences. She understands that there will be acute side effects from radiation therapy, nevertheless she would like to proceed. We will simulate her tomorrow before she goes back to [place] for 4 weeks. She will then start treatment on November 19th. Treatment will take about 6 weeks to complete.  We discussed adjuvant radiotherapy today.  I recommend radiotherapy to the left chest wall, SCV, and axillary nodes in order to provide locoregional control.  The risks, benefits and side effects of this treatment were discussed in detail.  She understands that radiotherapy is associated with skin irritation and fatigue in the acute setting. Late  effects can include cosmetic changes and rare injury to internal organs.   She is enthusiastic about proceeding with treatment. A consent form has been signed and placed in her chart.  A total of 5 medically necessary complex treatment devices will be fabricated and  supervised by me: 4 fields with MLCs for custom blocks to protect heart, and lungs;  and, a Vac-lok. MORE COMPLEX DEVICES MAY BE MADE IN DOSIMETRY FOR FIELD IN FIELD BEAMS FOR DOSE HOMOGENEITY.  I have requested : 3D Simulation which is medically necessary to give adequate dose to at risk tissues while sparing lungs and heart.  I have requested a DVH of the following structures: lungs, heart, esophagus, cord.    The patient will receive 50 Gy in 25 fractions to the left chest wall and regional nodes with 4 fields.     I spent 25 minutes face to face with the patient and more than 50% of that time was spent in counseling and/or coordination of care. _____________________________________   Eppie Gibson, MD  This document serves as a record of services personally performed by Eppie Gibson, MD. It was created on her behalf by Rae Lips, a trained medical scribe. The creation of this record is based on the scribe's personal observations and the provider's statements to them. This document has been checked and approved by the attending provider.

## 2018-04-06 ENCOUNTER — Ambulatory Visit
Admission: RE | Admit: 2018-04-06 | Discharge: 2018-04-06 | Disposition: A | Discharge status: Home / Self Care | Payer: Medicare Other | Source: Ambulatory Visit | Attending: Radiation Oncology | Admitting: Radiation Oncology

## 2018-04-06 DIAGNOSIS — Z170 Estrogen receptor positive status [ER+]: Secondary | ICD-10-CM | POA: Diagnosis not present

## 2018-04-06 DIAGNOSIS — C50212 Malignant neoplasm of upper-inner quadrant of left female breast: Secondary | ICD-10-CM | POA: Diagnosis not present

## 2018-04-06 NOTE — Progress Notes (Signed)
Radiation Oncology         [phone] ________________________________  Name: Tracy Howell MRN: [id number]  Date: 04/06/2018  DOB: 1945/06/29  SIMULATION AND TREATMENT PLANNING NOTE // Special treatment procedure  Outpatient  DIAGNOSIS:     ICD-10-CM   1. Malignant neoplasm of upper-inner quadrant of left breast in female, estrogen receptor positive ([place]) C50.212    Z17.0     NARRATIVE:  The patient was brought to the [place].  Identity was confirmed.  All relevant records and images related to the planned course of therapy were reviewed.  The patient freely provided informed written consent to proceed with treatment after reviewing the details related to the planned course of therapy. The consent form was witnessed and verified by the simulation staff.    Then, the patient was set-up in a stable reproducible supine position for radiation therapy with her ipsilateral arm over her head, and her upper body secured in a custom-made Vac-lok device.  CT images were obtained.  Surface markings were placed.  The CT images were loaded into the planning software.    Special treatment procedure:  Special treatment procedure was performed today due to the extra time and effort required by myself to plan and prepare this patient for deep inspiration breath hold technique.  I have determined cardiac sparing to be of benefit to this patient to prevent long term cardiac damage due to radiation of the heart.  Bellows were placed on the patient's abdomen. To facilitate cardiac sparing, the patient was coached by the radiation therapists on breath hold techniques and breathing practice was performed. Practice waveforms were obtained. The patient was then scanned while maintaining breath hold in the treatment position.  This image was then transferred over to the imaging specialist. The imaging specialist then created a fusion of the free breathing and breath hold scans using the  chest wall as the stable structure. I personally reviewed the fusion in axial, coronal and sagittal image planes.  Excellent cardiac sparing was obtained.  I felt the patient is an appropriate candidate for breath hold and the patient will be treated as such.  The image fusion was then reviewed with the patient to reinforce the necessity of reproducible breath hold.  TREATMENT PLANNING NOTE: Treatment planning then occurred.  The radiation prescription was entered and confirmed.     A total of 5 medically necessary complex treatment devices were fabricated and supervised by me: 4 fields with MLCs for custom blocks to protect heart, and lungs;  and, a Vac-lok. MORE COMPLEX DEVICES MAY BE MADE IN DOSIMETRY FOR FIELD IN FIELD BEAMS FOR DOSE HOMOGENEITY.  I have requested : 3D Simulation which is medically necessary to give adequate dose to at risk tissues while sparing lungs and heart.  I have requested a DVH of the following structures: lungs, heart, esophagus, cord.    The patient will receive 50 Gy in 25 fractions to the left chest wall and regional nodes with 4 fields.  This will not be followed by a boost.  Optical Surface Tracking Plan:  Since intensity modulated radiotherapy (IMRT) and 3D conformal radiation treatment methods are predicated on accurate and precise positioning for treatment, intrafraction motion monitoring is medically necessary to ensure accurate and safe treatment delivery. The ability to quantify intrafraction motion without excessive ionizing radiation dose can only be performed with optical surface tracking. Accordingly, surface imaging offers the opportunity to obtain 3D measurements of patient position throughout IMRT and 3D treatments  without excessive radiation exposure. I am ordering optical surface tracking for this patient's upcoming course of radiotherapy.  ________________________________   Reference:  Ursula Alert, J, et al. Surface imaging-based  analysis of intrafraction motion for breast radiotherapy patients.Journal of [place], n. 6, nov. 2014. ISSN [id number].  Available at: <http://www.jacmp.org/index.php/jacmp/article/view/4957>.    -----------------------------------  Eppie Gibson, MD

## 2018-04-07 NOTE — Progress Notes (Signed)
Welcome  [phone] [phone]     ID: Tracy Howell DOB: 1974-12-27  MR#: [id number]  JEH#:[id number]  Patient Care Team: Lucianne Lei, MD as PCP - General (Family Medicine) Alphonsa Overall, MD as Consulting Physician (General Surgery) Cambry Spampinato, Virgie Dad, MD as Consulting Physician (Oncology) Eppie Gibson, MD as Attending Physician (Radiation Oncology) Juanita Craver, MD as Consulting Physician (Gastroenterology) Larey Dresser, MD as Consulting Physician (Cardiology) Irene Limbo, MD as Consulting Physician (Plastic Surgery) OTHER MD:  CHIEF COMPLAINT: triple positive bilateral breast cancer  CURRENT TREATMENT:  Trastuzumab; tamoxifen; zolendronate; adjuvant radiation   INTERVAL HISTORY: Tracy Howell" Tracy Howell returns today for follow-up and treatment of her triple positive bilateral breast cancer accompanied by her husband. She receives trastuzumab every 21 days, with a dose due today. She tolerates this well.   She notes that her recent surgery went well and that her last drain was removed on yesterday. She hasn't had any pain following her surgery, however, she had a little bit of anxiety about looking at her scar, but she has looked at it since with no issues. She is not planning on having reconstruction surgery. She has been wearing a prosthetic bra. She starts radiation on 05/03/2018.   Since her last visit to the office, she underwent left mastectomy and axillary lymph node sampling ([id number]) on 03/21/2018 that showed: Muscle biopsy, Pectoralis with invasive ductal carcinoma. Breast, simple mastectomy, Left with invasive ductal carcinoma, grade 3, spanning 8.5 cm. High grade ductal carcinoma in situ. Tumor involves skeletal muscle, nipple, and very focally the epidermis. Invasive carcinoma involves the posterior margin. Resection margins are negative for in situ carcinoma. Lymph nodes, regional resection, Left axillary  contents with benign fibroadipose tissue. Muscle, resection, pectoralis with invasive ductal carcinoma. Present at the anterior, posterior, superior, and lateral margins.   She will have an echocardiogram on at 2 PM today, 04/08/2018.   Her case was discussed at the multidisciplinary breast cancer conference 04/06/2018.  The patient will need postmastectomy radiation.  Anti-HER-2 therapy, antiestrogens and reconsideration of genetics testing   REVIEW OF SYSTEMS: Ora did remarkably well with her surgery, with minimal pain, no bleeding or fever.  She had her drain removed yesterday.  Complains of some visual changes particularly with distant vision.  Denies unusual headaches, nausea, vomiting, or dizziness. There has been no unusual cough, phlegm production, or pleurisy. This been no change in bowel or bladder habits. She denies unexplained fatigue or unexplained weight loss, bleeding, rash, or fever. A detailed review of systems was otherwise stable.   HISTORY OF CURRENT ILLNESS: From the original intake note:  "Tracy Howell" tells me she underwent left lumpectomy in [place] on 2012 for a 2.5 cm, grade 2 breast cancer involving one lymph node of 5 sampled. [ It may have been only 2 lymph nodes that were removed she says.]  This was at the [facility], currently the [facility] [place].  The patient says she was told could have more treatment if she wanted it.  She did not see any sense in it.  She feels her left breast cancer was caused by the fact that she kept her iPhone in her bra right by the left breast.  Sometime in April 2018 she noted a new mass in the left breast. She did not immediately bring it to medical attention but as it continued to grow she mentioned her to her primary care physician and on 03/29/2017 Tracy Howell  underwent bilateral diagnostic mammography with tomography and left breast ultrasonography at [facility].  The breast  density was category B.  In the left breast central to the nipple there was a 6 cm irregular mass with indistinct margins and heterogeneous calcifications.  By ultrasound this measured 5 cm, with indistinct margins, at the 12:00 anterior area.  There was associated edema.  The left axilla was sonographically benign.  Biopsy of the left breast area in question March 30, 2017 showed ([id number]) invasive ductal carcinoma, grade 3, estrogen receptor 100% positive, progesterone receptor 60% positive, both with strong staining intensity, HER-2 amplified, with a signals ratio of 3.38, and the number per cell 7.95.  The  MIB-1 was 70%.  The patient's subsequent history is as detailed below.    PAST MEDICAL HISTORY: Past Medical History:  Diagnosis Date  . Angioedema   . Anxiety   . Breast cancer ([place]) 2013  . Complication of anesthesia    slow to wake up   . Pneumonia    hx of x 2   . recurrent left breast ca dx'd 04/2017  . SBO (small bowel obstruction) ([place]) 08/13/2014    PAST SURGICAL HISTORY: Past Surgical History:  Procedure Laterality Date  . ABDOMINAL HYSTERECTOMY    . APPENDECTOMY    . APPLICATION OF WOUND VAC Left 03/21/2018   Procedure: APPLICATION OF WOUND VAC;  Surgeon: Irene Limbo, MD;  Location: [place];  Service: Plastics;  Laterality: Left;  . BREAST LUMPECTOMY  2013  . BREAST SURGERY    . CESAREAN SECTION     x2  . CHOLECYSTECTOMY    . CYST EXCISION     on abdomen  . MASTECTOMY WITH AXILLARY LYMPH NODE DISSECTION Left 03/21/2018   Procedure: LEFT MASTECTOMY WITH AXILLARY LYMPH NODE DISSECTION;  Surgeon: Alphonsa Overall, MD;  Location: [place];  Service: General;  Laterality: Left;  . PORTACATH PLACEMENT Right 05/17/2017   Procedure: INSERTION PORT-A-CATH right subclavian vein;  Surgeon: Alphonsa Overall, MD;  Location: [facility];  Service: General;  Laterality: Right;  . TRAM Left 03/21/2018   Procedure: ADJACENT TISSUE TRANSFER LEFT CHEST AND ABDOMEN TO LEFT CHEST;   Surgeon: Irene Limbo, MD;  Location: [place];  Service: Plastics;  Laterality: Left;    FAMILY HISTORY Family History  Problem Relation Age of Onset  . Angioedema Mother   . Breast cancer Mother   . Breast cancer Maternal Aunt   The patient was adopted.  Her adoptive parents died in their 61s.  The patient's birth mother however died in her 74s from breast cancer.  The patient believes she has 2 brothers is not sure about sisters.  However she does have 2 maternal aunts who had breast cancer.  There is no history of ovarian cancer in the family to her knowledge.  The patient has no information regarding her father or his side of the family.   GYNECOLOGIC HISTORY:  No LMP recorded. Patient has had a hysterectomy. Menarche age 83, first live birth age 80, she is N[place] P2.  She underwent hysterectomy at age 33.  She tells me they left a fourth of an ovary at that time.  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  She actually lives in [place], [place], and spends 2 weeks here every 2 months at her job.  When she is in town she actually lives with Dr. Criss Rosales. 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  She recently married [name] who is a Camera operator.  He has 2 children of his own, in [place] and [place].  The patient's own children are [name] who lives in [place] and works in Engineer, technical sales, and [employer] lives in [place] and owns a [place].  The patient has no grandchildren.  She is 1/7-day [facility].  (She tells me her religion has no bands on any medical treatments but she cannot have alcohol, cigarettes, or pork).   ADVANCED DIRECTIVES:    HEALTH MAINTENANCE: Social History   Tobacco Use  . Smoking status: Never Smoker  . Smokeless tobacco: Never Used  Substance Use  Topics  . Alcohol use: No  . Drug use: No     Colonoscopy:  PAP:  Bone density:   Allergies  Allergen Reactions  . Onion Anaphylaxis, Nausea And Vomiting and Swelling    Throat swelling.  . Other Anaphylaxis and Nausea And Vomiting    Green Peppers-anaphylactic, nausea/vomiting/swelling  Mushrooms-nausea/vomiting.  Patient has hereditary angioedema (HAE)  . Penicillins Rash and Other (See Comments)    Vomiting; has tolerated oral cephalosporins > intolerance PATIENT HAS HAD A PCN REACTION WITH IMMEDIATE RASH, FACIAL/TONGUE/THROAT SWELLING, SOB, OR LIGHTHEADEDNESS WITH HYPOTENSION:  #  #  YES  #  #  HAS PT DEVELOPED SEVERE RASH INVOLVING MUCUS MEMBRANES or SKIN NECROSIS: #  #  YES  #  #  Has patient had a PCN rxn that required hospitalization:Was inpatient when reaction occurred. Has patient had a PCN reaction occurring within the last 10 years: No   . Adhesive [Tape]     Bruises skin   . Aleve [Naproxen] Other (See Comments)    bleeding  . Anastrozole     Talking out of my head, hot flashes, body felt weird   . Codeine Nausea And Vomiting  . Morphine And Related Nausea And Vomiting  . Neulasta [Pegfilgrastim]     Muscle and bone pain    Current Outpatient Medications  Medication Sig Dispense Refill  . Ascorbic Acid (VITAMIN C PO) Take 1 tablet by mouth daily as needed (immune support).     . B Complex-C (B-COMPLEX WITH VITAMIN C) tablet Take 1 tablet by mouth daily as needed (immune support).     . cholecalciferol (VITAMIN D) 1000 units tablet Take 1,000 Units by mouth at bedtime.    . COD LIVER OIL PO Take 1 capsule by mouth daily as needed (immune support).     Marland Kitchen lidocaine-prilocaine (EMLA) cream APP EXT AA 1 TIME    . LORazepam (ATIVAN) 0.5 MG tablet Take 1 tablet (0.5 mg total) by mouth at bedtime as needed for anxiety. (Patient not taking: Reported on 04/05/2018) 20 tablet 0  . Multiple Vitamin (MULTIVITAMIN WITH MINERALS) TABS tablet Take 1 tablet by mouth at  bedtime.     . triamterene-hydrochlorothiazide (DYAZIDE) 37.5-25 MG capsule Take 1 each (1 capsule total) by mouth daily. (Patient not taking: Reported on 04/05/2018) 30 capsule 0   No current facility-administered medications for this visit.     OBJECTIVE: African-American woman who looks remarkably well postop  There were no vitals filed for this visit.   There is no height or weight on file to calculate BMI.   Wt Readings from Last 3 Encounters:  04/05/18 226 lb 12.8 oz (102.9 kg)  03/21/18 226 lb (102.5 kg)  03/18/18 227 lb 9.6 oz (103.2 kg)  ECOG FS:1 - Symptomatic but completely ambulatory   Sclerae unicteric, EOMs intact Oropharynx clear and moist No cervical or supraclavicular adenopathy Lungs no rales or rhonchi Heart regular rate and rhythm Abd soft, nontender, positive bowel sounds MSK no focal spinal tenderness, no upper extremity lymphedema Neuro: nonfocal, well oriented, appropriate affect Breasts: The right breast is unremarkable.  The left breast is status post mastectomy and is imaged below.  There is minimal dehiscence as noted.  There is no erythema or swelling.  Both axillae are benign.  Breast on 11/01/2017    Left breast 03/18/2018     Left chest wall, 04/08/18      LAB RESULTS:  CMP     Component Value Date/Time   NA 135 03/22/2018 0441   NA 142 05/12/2017 0808   K 4.2 03/22/2018 0441   K 3.5 05/12/2017 0808   CL 104 03/22/2018 0441   CO2 24 03/22/2018 0441   CO2 26 05/12/2017 0808   GLUCOSE 160 (H) 03/22/2018 0441   GLUCOSE 133 05/12/2017 0808   BUN 12 03/22/2018 0441   BUN 9.8 05/12/2017 0808   CREATININE 0.85 03/22/2018 0441   CREATININE 0.76 03/18/2018 0802   CREATININE 0.9 05/12/2017 0808   CALCIUM 8.6 (L) 03/22/2018 0441   CALCIUM 9.3 05/12/2017 0808   PROT 7.7 03/18/2018 0802   PROT 7.3 05/12/2017 0808   ALBUMIN 3.2 (L) 03/18/2018 0802   ALBUMIN 3.3 (L) 05/12/2017 0808   AST 35 03/18/2018 0802   AST 15 05/12/2017 0808    ALT 22 03/18/2018 0802   ALT 10 05/12/2017 0808   ALKPHOS 132 (H) 03/18/2018 0802   ALKPHOS 90 05/12/2017 0808   BILITOT 0.4 03/18/2018 0802   BILITOT 0.56 05/12/2017 0808   GFRNONAA >60 03/22/2018 0441   GFRNONAA >60 03/18/2018 0802   GFRAA >60 03/22/2018 0441   GFRAA >60 03/18/2018 0802    No results found for: Ronnald Ramp, A1GS, A2GS, BETS, BETA2SER, GAMS, MSPIKE, SPEI  No results found for: Nils Pyle, [facility]  Lab Results  Component Value Date   WBC 9.4 03/22/2018   NEUTROABS 2.3 03/18/2018   HGB 9.4 (L) 03/22/2018   HCT 30.0 (L) 03/22/2018   MCV 97.4 03/22/2018   PLT 208 03/22/2018      Chemistry      Component Value Date/Time   NA 135 03/22/2018 0441   NA 142 05/12/2017 0808   K 4.2 03/22/2018 0441   K 3.5 05/12/2017 0808   CL 104 03/22/2018 0441   CO2 24 03/22/2018 0441   CO2 26 05/12/2017 0808   BUN 12 03/22/2018 0441   BUN 9.8 05/12/2017 0808   CREATININE 0.85 03/22/2018 0441   CREATININE 0.76 03/18/2018 0802   CREATININE 0.9 05/12/2017 0808      Component Value Date/Time   CALCIUM 8.6 (L) 03/22/2018 0441   CALCIUM 9.3 05/12/2017 0808   ALKPHOS 132 (H) 03/18/2018 0802   ALKPHOS 90 05/12/2017 0808   AST 35 03/18/2018 0802   AST 15 05/12/2017 0808   ALT 22 03/18/2018 0802   ALT 10 05/12/2017 0808   BILITOT 0.4 03/18/2018 0802   BILITOT 0.56 05/12/2017 0808       No results found for: LABCA2  No components found for: NATFTD322  No results for input(s): INR in the last 168 hours.  No results found for: LABCA2  No results found for: GUR427  No results found for: CWC376  No results found for:  WUJ811  Lab Results  Component Value Date   CA2729 18.8 03/18/2018    No components found for: HGQUANT  No results found for: CEA1 / No results found for: CEA1   No results found for: AFPTUMOR  No results found for: [place]  No results found for: PSA1  No visits with results within 3 Day(s) from this  visit.  Latest known visit with results is:  Admission on 03/21/2018, Discharged on 03/23/2018  Component Date Value Ref Range Status  . WBC 03/22/2018 9.4  4.0 - 10.5 K/uL Final  . RBC 03/22/2018 3.08* 3.87 - 5.11 MIL/uL Final  . Hemoglobin 03/22/2018 9.4* 12.0 - 15.0 g/dL Final  . HCT 03/22/2018 30.0* 36.0 - 46.0 % Final  . MCV 03/22/2018 97.4  80.0 - 100.0 fL Final  . MCH 03/22/2018 30.5  26.0 - 34.0 pg Final  . MCHC 03/22/2018 31.3  30.0 - 36.0 g/dL Final  . RDW 03/22/2018 13.9  11.5 - 15.5 % Final  . Platelets 03/22/2018 208  150 - 400 K/uL Final   Performed at [facility], [place] [address] [place], [place] [address]  . Sodium 03/22/2018 135  135 - 145 mmol/L Final  . Potassium 03/22/2018 4.2  3.5 - 5.1 mmol/L Final  . Chloride 03/22/2018 104  98 - 111 mmol/L Final  . CO2 03/22/2018 24  22 - 32 mmol/L Final  . Glucose, Bld 03/22/2018 160* 70 - 99 mg/dL Final  . BUN 03/22/2018 12  8 - 23 mg/dL Final  . Creatinine, Ser 03/22/2018 0.85  0.44 - 1.00 mg/dL Final  . Calcium 03/22/2018 8.6* 8.9 - 10.3 mg/dL Final  . GFR calc non Af Amer 03/22/2018 >60  >60 mL/min Final  . GFR calc Af Amer 03/22/2018 >60  >60 mL/min Final   Comment: (NOTE) The eGFR has been calculated using the CKD EPI equation. This calculation has not been validated in all clinical situations. eGFR's persistently <60 mL/min signify possible Chronic Kidney Disease.   Georgiann Hahn gap 03/22/2018 7  5 - 15 Final   Performed at [facility], [place] [address], [place], [place] [address]    (this displays the last labs from the last 3 days)  No results found for: TOTALPROTELP, ALBUMINELP, A1GS, A2GS, BETS, BETA2SER, GAMS, MSPIKE, SPEI (this displays SPEP labs)  No results found for: KPAFRELGTCHN, LAMBDASER, KAPLAMBRATIO (kappa/lambda light chains)  No results found for: HGBA, HGBA2QUANT, HGBFQUANT, HGBSQUAN (Hemoglobinopathy evaluation)   No results found for: LDH  No results found for: IRON, TIBC,  IRONPCTSAT (Iron and TIBC)  No results found for: FERRITIN  Urinalysis    Component Value Date/Time   COLORURINE YELLOW 08/10/2017 [place] 08/10/2017 0924   LABSPEC 1.010 08/10/2017 0924   PHURINE 6.5 08/10/2017 [place] 08/10/2017 0924   HGBUR NEGATIVE 08/10/2017 [place] 08/10/2017 [place] 08/10/2017 0924   PROTEINUR NEGATIVE 08/10/2017 0924   NITRITE NEGATIVE 08/10/2017 0924   LEUKOCYTESUR SMALL (A) 08/10/2017 0924     STUDIES: She will have an echocardiogram on at 2 PM today, 04/08/2018.   ELIGIBLE FOR AVAILABLE RESEARCH PROTOCOL:no  ASSESSMENT: 72 y.o. [place] woman (primarily residing in [place])  (1) status post left lumpectomy in 2012 for a reported pT2 pN1 breast cancer, the patient refusing adjuvant treatment (no chemotherapy, radiation, or antiestrogens).  METASTATIC DISEASE: December 2018 (bilateral triple positive disease) (2) status post left breast biopsy 03/30/2017 for a clinical T4  N0, stage IIIB invasive ductal carcinoma, grade 3, triple positive, with an MIB-1 of 70%  (a) staging studies 05/21/2017 show multiple bilateral pulmonary nodules consistent with stage IV disease 05/21/2017; there are no liver or bone lesions noted  (b) breast MRI 05/13/2017 shows a 7 cm area of non-masslike enhancement in the right breast, with biopsy ([id number]) on 06/24/2017 showing: Metastatic carcinoma. Prognostic panel was strongly ER and PR positive as well as HER-2 positive with the signals ratio of 2.61 and number per cell 5.35 (triple positive)  (c) left breast skin biopsy 02/02/2018 confirms triple positive breast cancer  (3) neoadjuvant chemotherapy with T-DM1 started 06/29/2016  (a) echocardiogram 06/24/2017 showed an ejection fraction of 60-65% range  (b) echocardiogram 09/23/2017 showed an ejection fraction of 65-70% range  (c) CT chest on 10/12/2017 shows progression in nodes, pulmonary nodules,  and bone mets   (4) status post left mastectomy with axillary lymph node dissection 03/21/2018 for a pT3 pN2 invasive ductal carcinoma, grade 3, involving pectoralis muscle as well as the nipple and epidermis  (5) anastrozole written on 09/21/2017, started 02/13/2018, discontinued after 2 weeks with visual changes  (a) patient notes inability to tolerate tamoxifen in the past (nausea).  (6) genetics testing pending  (7) chemotherapy with carboplatin, gemcitabine, and trastuzumab 11/01/2017-01/18/2018, with Gemcitabine and Carboplatin given on day 1, and Gemcitabine, Carboplatin, and Trastuzumab given on day 8,repeated every 21 days x 4, last dose 01/18/2018  (a) carboplatin omitted from cycle 4 because of neuropathy  (8) trastuzumab to be continued indefinitely  (a) echocardiogram 01/11/2018 showed an ejection fraction in the 65-70% range  (b) echocardiogram 04/08/2018 results pending  (9) postmastectomy radiation pending  (10) zolendronate repeated every 12 weeks, first dose 05/24/2018  (11) tamoxifen to start 04/08/2018  PLAN: I spent approximately 40 minutes face to face with Tracy Howell with more than 50% of that time spent in counseling and coordination of care.  I am delighted that she did as well with the surgery as she did and I am hopeful that with the follow-up radiation we will be able to avoid a chest wall recurrence.  We reviewed systemic therapy.  At this point we are trying to stay away from chemotherapy.  She does need to go on antiestrogens.  My recommendation was that she go with Faslodex.  She would like to give tamoxifen a try first so I have gone ahead and put that prescription in for her.  Once she has been on an antiestrogen for 2 or 3 months then we know she can tolerate it we will consider adding a CDK 4,6 inhibitor.  She does have lytic bone lesions.  We are going to start zolendronate 05/24/2018.  We discussed the possible toxicity side effects and complications of  this agent and I have asked her to see her dentist in the next couple of weeks so that we know she will not need any dental work that involves surgery.  She tells me she is terrified when she goes to the dentist and her blood pressure goes up so they sent her to the emergency room for the blood pressure problem.  She does have lorazepam on hand and she will try taking lorazepam before seeing the dentist but she does need to make a dental visit and she is aware of that  We are continuing the trastuzumab indefinitely.  She will have a repeat echocardiogram today  She will start her radiation treatments 05/03/2018.  She will see me again 06/14/2018.  I  plan to restage her with a PET scan sometime in April or May  She knows to call for any other issues that may develop before the next visit.   Tacara Hadlock, Virgie Dad, MD  04/07/18 1:07 PM Medical Oncology and Hematology [facility] [address] [place], [place] [address] Tel. [phone]    Fax. [phone]  I, Soijett Blue, am acting as scribe for Chauncey Cruel MD.  I, Lurline Del MD, have reviewed the above documentation for accuracy and completeness, and I agree with the above.

## 2018-04-08 ENCOUNTER — Other Ambulatory Visit: Payer: Self-pay

## 2018-04-08 ENCOUNTER — Inpatient Hospital Stay (HOSPITAL_BASED_OUTPATIENT_CLINIC_OR_DEPARTMENT_OTHER): Payer: Medicare Other | Admitting: Oncology

## 2018-04-08 ENCOUNTER — Telehealth: Payer: Self-pay | Admitting: Oncology

## 2018-04-08 ENCOUNTER — Ambulatory Visit (HOSPITAL_COMMUNITY): Payer: Medicare Other | Attending: Internal Medicine

## 2018-04-08 ENCOUNTER — Inpatient Hospital Stay: Payer: Medicare Other

## 2018-04-08 VITALS — BP 140/58 | HR 90 | Temp 97.8°F | Resp 17 | Ht 66.5 in | Wt 225.7 lb

## 2018-04-08 DIAGNOSIS — Z794 Long term (current) use of insulin: Secondary | ICD-10-CM | POA: Insufficient documentation

## 2018-04-08 DIAGNOSIS — Z853 Personal history of malignant neoplasm of breast: Secondary | ICD-10-CM | POA: Diagnosis not present

## 2018-04-08 DIAGNOSIS — C50212 Malignant neoplasm of upper-inner quadrant of left female breast: Secondary | ICD-10-CM

## 2018-04-08 DIAGNOSIS — Z79899 Other long term (current) drug therapy: Secondary | ICD-10-CM

## 2018-04-08 DIAGNOSIS — E109 Type 1 diabetes mellitus without complications: Secondary | ICD-10-CM | POA: Insufficient documentation

## 2018-04-08 DIAGNOSIS — C7951 Secondary malignant neoplasm of bone: Secondary | ICD-10-CM | POA: Diagnosis not present

## 2018-04-08 DIAGNOSIS — C7800 Secondary malignant neoplasm of unspecified lung: Secondary | ICD-10-CM | POA: Insufficient documentation

## 2018-04-08 DIAGNOSIS — Z803 Family history of malignant neoplasm of breast: Secondary | ICD-10-CM | POA: Diagnosis not present

## 2018-04-08 DIAGNOSIS — G620 Drug-induced polyneuropathy: Secondary | ICD-10-CM | POA: Insufficient documentation

## 2018-04-08 DIAGNOSIS — Z5112 Encounter for antineoplastic immunotherapy: Secondary | ICD-10-CM | POA: Insufficient documentation

## 2018-04-08 DIAGNOSIS — F419 Anxiety disorder, unspecified: Secondary | ICD-10-CM | POA: Insufficient documentation

## 2018-04-08 DIAGNOSIS — Z170 Estrogen receptor positive status [ER+]: Principal | ICD-10-CM

## 2018-04-08 LAB — CMP (CANCER CENTER ONLY)
ALT: 9 U/L (ref 0–44)
AST: 22 U/L (ref 15–41)
Albumin: 3.1 g/dL — ABNORMAL LOW (ref 3.5–5.0)
Alkaline Phosphatase: 107 U/L (ref 38–126)
Anion gap: 11 (ref 5–15)
BILIRUBIN TOTAL: 0.5 mg/dL (ref 0.3–1.2)
BUN: 12 mg/dL (ref 8–23)
CO2: 27 mmol/L (ref 22–32)
CREATININE: 0.79 mg/dL (ref 0.44–1.00)
Calcium: 9.5 mg/dL (ref 8.9–10.3)
Chloride: 106 mmol/L (ref 98–111)
GFR, Est AFR Am: >60 mL/min (ref >60)
GLUCOSE: 125 mg/dL — AB (ref 70–99)
Potassium: 3.5 mmol/L (ref 3.5–5.1)
Sodium: 144 mmol/L (ref 135–145)
TOTAL PROTEIN: 7.2 g/dL (ref 6.5–8.1)

## 2018-04-08 LAB — CBC WITH DIFFERENTIAL (CANCER CENTER ONLY)
Abs Immature Granulocytes: 0.02 10*3/uL (ref 0.00–0.07)
Basophils Absolute: 0 10*3/uL (ref 0.0–0.1)
Basophils Relative: 0 %
EOS PCT: 16 %
Eosinophils Absolute: 1 10*3/uL — ABNORMAL HIGH (ref 0.0–0.5)
HCT: 33.4 % — ABNORMAL LOW (ref 36.0–46.0)
HEMOGLOBIN: 10.2 g/dL — AB (ref 12.0–15.0)
Immature Granulocytes: 0 %
Lymphocytes Relative: 35 %
Lymphs Abs: 2.2 10*3/uL (ref 0.7–4.0)
MCH: 30.1 pg (ref 26.0–34.0)
MCHC: 30.5 g/dL (ref 30.0–36.0)
MCV: 98.5 fL (ref 80.0–100.0)
MONO ABS: 0.5 10*3/uL (ref 0.1–1.0)
MONOS PCT: 8 %
Neutro Abs: 2.5 10*3/uL (ref 1.7–7.7)
Neutrophils Relative %: 41 %
Platelet Count: 273 10*3/uL (ref 150–400)
RBC: 3.39 MIL/uL — AB (ref 3.87–5.11)
RDW: 14.6 % (ref 11.5–15.5)
WBC: 6.2 10*3/uL (ref 4.0–10.5)
nRBC: 0 % (ref 0.0–0.2)

## 2018-04-08 LAB — ECHOCARDIOGRAM COMPLETE
Height: 66.5 in
WEIGHTICAEL: 3611.2 [oz_av]

## 2018-04-08 MED ORDER — SODIUM CHLORIDE 0.9% FLUSH
10.0000 mL | INTRAVENOUS | Status: DC | PRN
  Administered 2018-04-08: 10 mL
  Filled 2018-04-08: qty 10

## 2018-04-08 MED ORDER — ACETAMINOPHEN 325 MG PO TABS
650.0000 mg | ORAL_TABLET | Freq: Once | ORAL | Status: AC
  Administered 2018-04-08: 650 mg via ORAL

## 2018-04-08 MED ORDER — DIPHENHYDRAMINE HCL 25 MG PO CAPS
ORAL_CAPSULE | ORAL | Status: AC
  Filled 2018-04-08: qty 1

## 2018-04-08 MED ORDER — SODIUM CHLORIDE 0.9 % IV SOLN
Freq: Once | INTRAVENOUS | Status: AC
  Administered 2018-04-08: 10:00:00 via INTRAVENOUS
  Filled 2018-04-08: qty 250

## 2018-04-08 MED ORDER — TAMOXIFEN CITRATE 20 MG PO TABS: 20.0000 mg | ORAL_TABLET | Freq: Every day | ORAL | 12 refills | Status: AC

## 2018-04-08 MED ORDER — DIPHENHYDRAMINE HCL 25 MG PO CAPS
25.0000 mg | ORAL_CAPSULE | Freq: Once | ORAL | Status: AC
  Administered 2018-04-08: 25 mg via ORAL

## 2018-04-08 MED ORDER — HEPARIN SOD (PORK) LOCK FLUSH 100 UNIT/ML IV SOLN
500.0000 [IU] | Freq: Once | INTRAVENOUS | Status: AC | PRN
  Administered 2018-04-08: 500 [IU]
  Filled 2018-04-08: qty 5

## 2018-04-08 MED ORDER — ACETAMINOPHEN 325 MG PO TABS
ORAL_TABLET | ORAL | Status: AC
  Filled 2018-04-08: qty 2

## 2018-04-08 MED ORDER — TRASTUZUMAB CHEMO 150 MG IV SOLR
600.0000 mg | Freq: Once | INTRAVENOUS | Status: AC
  Administered 2018-04-08: 600 mg via INTRAVENOUS
  Filled 2018-04-08: qty 28.57

## 2018-04-08 NOTE — Telephone Encounter (Signed)
Pt sched per 10/25 los. Pt will receive update on mychart

## 2018-04-08 NOTE — Telephone Encounter (Signed)
LMV for patient with date/time of appt added to schedule per 10/25 sch msg

## 2018-04-09 LAB — CANCER ANTIGEN 27.29: CA 27.29: 15 U/mL (ref 0.0–38.6)

## 2018-04-18 ENCOUNTER — Other Ambulatory Visit: Payer: Self-pay | Admitting: Oncology

## 2018-04-18 DIAGNOSIS — C50011 Malignant neoplasm of nipple and areola, right female breast: Secondary | ICD-10-CM

## 2018-04-19 DIAGNOSIS — C50212 Malignant neoplasm of upper-inner quadrant of left female breast: Secondary | ICD-10-CM | POA: Diagnosis not present

## 2018-04-19 DIAGNOSIS — Z170 Estrogen receptor positive status [ER+]: Secondary | ICD-10-CM | POA: Insufficient documentation

## 2018-04-22 ENCOUNTER — Encounter: Payer: Self-pay | Admitting: Oncology

## 2018-04-29 ENCOUNTER — Ambulatory Visit: Payer: Medicare Other

## 2018-04-29 ENCOUNTER — Other Ambulatory Visit: Payer: Medicare Other

## 2018-05-03 ENCOUNTER — Ambulatory Visit
Admission: RE | Admit: 2018-05-03 | Discharge: 2018-05-03 | Disposition: A | Discharge status: Home / Self Care | Payer: Medicare Other | Source: Ambulatory Visit | Attending: Radiation Oncology | Admitting: Radiation Oncology

## 2018-05-03 ENCOUNTER — Inpatient Hospital Stay: Payer: Medicare Other

## 2018-05-03 ENCOUNTER — Inpatient Hospital Stay: Payer: Medicare Other | Attending: Oncology

## 2018-05-03 VITALS — BP 161/99 | HR 74 | Temp 98.4°F | Resp 18 | Ht 66.5 in | Wt 226.2 lb

## 2018-05-03 DIAGNOSIS — C50212 Malignant neoplasm of upper-inner quadrant of left female breast: Secondary | ICD-10-CM

## 2018-05-03 DIAGNOSIS — Z170 Estrogen receptor positive status [ER+]: Secondary | ICD-10-CM | POA: Insufficient documentation

## 2018-05-03 DIAGNOSIS — Z5112 Encounter for antineoplastic immunotherapy: Secondary | ICD-10-CM | POA: Insufficient documentation

## 2018-05-03 LAB — CMP (CANCER CENTER ONLY)
ALK PHOS: 109 U/L (ref 38–126)
ALT: 10 U/L (ref 0–44)
AST: 20 U/L (ref 15–41)
Albumin: 3.1 g/dL — ABNORMAL LOW (ref 3.5–5.0)
Anion gap: 8 (ref 5–15)
BUN: 11 mg/dL (ref 8–23)
CALCIUM: 9.1 mg/dL (ref 8.9–10.3)
CO2: 28 mmol/L (ref 22–32)
Chloride: 108 mmol/L (ref 98–111)
Creatinine: 0.72 mg/dL (ref 0.44–1.00)
GFR, Est AFR Am: >60 mL/min (ref >60)
GFR, Estimated: >60 mL/min (ref >60)
Glucose, Bld: 103 mg/dL — ABNORMAL HIGH (ref 70–99)
Potassium: 3.5 mmol/L (ref 3.5–5.1)
SODIUM: 144 mmol/L (ref 135–145)
Total Bilirubin: 0.4 mg/dL (ref 0.3–1.2)
Total Protein: 7.2 g/dL (ref 6.5–8.1)

## 2018-05-03 LAB — CBC WITH DIFFERENTIAL (CANCER CENTER ONLY)
Abs Immature Granulocytes: 0.02 10*3/uL (ref 0.00–0.07)
BASOS ABS: 0 10*3/uL (ref 0.0–0.1)
Basophils Relative: 0 %
EOS ABS: 0.2 10*3/uL (ref 0.0–0.5)
EOS PCT: 3 %
HEMATOCRIT: 34.9 % — AB (ref 36.0–46.0)
Hemoglobin: 11 g/dL — ABNORMAL LOW (ref 12.0–15.0)
Immature Granulocytes: 0 %
LYMPHS ABS: 1.9 10*3/uL (ref 0.7–4.0)
LYMPHS PCT: 30 %
MCH: 29.7 pg (ref 26.0–34.0)
MCHC: 31.5 g/dL (ref 30.0–36.0)
MCV: 94.3 fL (ref 80.0–100.0)
Monocytes Absolute: 0.5 10*3/uL (ref 0.1–1.0)
Monocytes Relative: 8 %
NEUTROS PCT: 59 %
NRBC: 0 % (ref 0.0–0.2)
Neutro Abs: 3.8 10*3/uL (ref 1.7–7.7)
Platelet Count: 251 10*3/uL (ref 150–400)
RBC: 3.7 MIL/uL — AB (ref 3.87–5.11)
RDW: 13.7 % (ref 11.5–15.5)
WBC: 6.4 10*3/uL (ref 4.0–10.5)

## 2018-05-03 MED ORDER — HEPARIN SOD (PORK) LOCK FLUSH 100 UNIT/ML IV SOLN
500.0000 [IU] | Freq: Once | INTRAVENOUS | Status: AC | PRN
  Administered 2018-05-03: 500 [IU]
  Filled 2018-05-03: qty 5

## 2018-05-03 MED ORDER — TRASTUZUMAB CHEMO 150 MG IV SOLR
600.0000 mg | Freq: Once | INTRAVENOUS | Status: AC
  Administered 2018-05-03: 600 mg via INTRAVENOUS
  Filled 2018-05-03: qty 28.57

## 2018-05-03 MED ORDER — ACETAMINOPHEN 325 MG PO TABS
650.0000 mg | ORAL_TABLET | Freq: Once | ORAL | Status: AC
  Administered 2018-05-03: 650 mg via ORAL

## 2018-05-03 MED ORDER — SODIUM CHLORIDE 0.9 % IV SOLN
Freq: Once | INTRAVENOUS | Status: AC
  Administered 2018-05-03: 12:00:00 via INTRAVENOUS
  Filled 2018-05-03: qty 250

## 2018-05-03 MED ORDER — DIPHENHYDRAMINE HCL 25 MG PO CAPS
25.0000 mg | ORAL_CAPSULE | Freq: Once | ORAL | Status: AC
  Administered 2018-05-03: 25 mg via ORAL

## 2018-05-03 MED ORDER — DIPHENHYDRAMINE HCL 25 MG PO CAPS
ORAL_CAPSULE | ORAL | Status: AC
  Filled 2018-05-03: qty 1

## 2018-05-03 MED ORDER — ACETAMINOPHEN 325 MG PO TABS
ORAL_TABLET | ORAL | Status: AC
  Filled 2018-05-03: qty 2

## 2018-05-03 MED ORDER — SODIUM CHLORIDE 0.9% FLUSH
10.0000 mL | INTRAVENOUS | Status: DC | PRN
  Administered 2018-05-03: 10 mL
  Filled 2018-05-03: qty 10

## 2018-05-03 NOTE — Patient Instructions (Signed)
[facility] Discharge Instructions for Patients Receiving Chemotherapy  Today you received the following chemotherapy agents: Trastuzumab (Herceptin).  To help prevent nausea and vomiting after your treatment, we encourage you to take your nausea medication as prescribed. If you develop nausea and vomiting that is not controlled by your nausea medication, call the clinic.   BELOW ARE SYMPTOMS THAT SHOULD BE REPORTED IMMEDIATELY:  *FEVER GREATER THAN 100.5 F  *CHILLS WITH OR WITHOUT FEVER  NAUSEA AND VOMITING THAT IS NOT CONTROLLED WITH YOUR NAUSEA MEDICATION  *UNUSUAL SHORTNESS OF BREATH  *UNUSUAL BRUISING OR BLEEDING  TENDERNESS IN MOUTH AND THROAT WITH OR WITHOUT PRESENCE OF ULCERS  *URINARY PROBLEMS  *BOWEL PROBLEMS  UNUSUAL RASH Items with * indicate a potential emergency and should be followed up as soon as possible.  Feel free to call the clinic should you have any questions or concerns. The clinic phone number is [phone].  Please show the CHEMO ALERT CARD at check-in to the Emergency Department and triage nurse.   

## 2018-05-04 ENCOUNTER — Ambulatory Visit
Admission: RE | Admit: 2018-05-04 | Discharge: 2018-05-04 | Disposition: A | Discharge status: Home / Self Care | Payer: Medicare Other | Source: Ambulatory Visit | Attending: Radiation Oncology | Admitting: Radiation Oncology

## 2018-05-04 DIAGNOSIS — C50212 Malignant neoplasm of upper-inner quadrant of left female breast: Secondary | ICD-10-CM | POA: Diagnosis not present

## 2018-05-04 DIAGNOSIS — Z170 Estrogen receptor positive status [ER+]: Secondary | ICD-10-CM | POA: Diagnosis not present

## 2018-05-04 LAB — CANCER ANTIGEN 27.29: CAN 27.29: 18.4 U/mL (ref 0.0–38.6)

## 2018-05-05 ENCOUNTER — Ambulatory Visit
Admission: RE | Admit: 2018-05-05 | Discharge: 2018-05-05 | Disposition: A | Discharge status: Home / Self Care | Payer: Medicare Other | Source: Ambulatory Visit | Attending: Radiation Oncology | Admitting: Radiation Oncology

## 2018-05-05 DIAGNOSIS — Z170 Estrogen receptor positive status [ER+]: Secondary | ICD-10-CM | POA: Diagnosis not present

## 2018-05-05 DIAGNOSIS — C50212 Malignant neoplasm of upper-inner quadrant of left female breast: Secondary | ICD-10-CM | POA: Diagnosis not present

## 2018-05-06 ENCOUNTER — Ambulatory Visit
Admission: RE | Admit: 2018-05-06 | Discharge: 2018-05-06 | Disposition: A | Discharge status: Home / Self Care | Payer: Medicare Other | Source: Ambulatory Visit | Attending: Radiation Oncology | Admitting: Radiation Oncology

## 2018-05-06 DIAGNOSIS — Z170 Estrogen receptor positive status [ER+]: Secondary | ICD-10-CM | POA: Diagnosis not present

## 2018-05-06 DIAGNOSIS — C50212 Malignant neoplasm of upper-inner quadrant of left female breast: Secondary | ICD-10-CM

## 2018-05-06 MED ORDER — RADIAPLEXRX EX GEL
Freq: Once | CUTANEOUS | Status: AC
  Administered 2018-05-06: 10:00:00 via TOPICAL

## 2018-05-06 MED ORDER — ALRA NON-METALLIC DEODORANT (RAD-ONC)
1.0000 "application " | Freq: Once | TOPICAL | Status: AC
  Administered 2018-05-06: 1 via TOPICAL

## 2018-05-06 NOTE — Progress Notes (Signed)

## 2018-05-08 ENCOUNTER — Ambulatory Visit
Admission: RE | Admit: 2018-05-08 | Discharge: 2018-05-08 | Disposition: A | Discharge status: Home / Self Care | Payer: Medicare Other | Source: Ambulatory Visit | Attending: Radiation Oncology | Admitting: Radiation Oncology

## 2018-05-08 DIAGNOSIS — Z170 Estrogen receptor positive status [ER+]: Secondary | ICD-10-CM | POA: Diagnosis not present

## 2018-05-08 DIAGNOSIS — C50212 Malignant neoplasm of upper-inner quadrant of left female breast: Secondary | ICD-10-CM | POA: Diagnosis not present

## 2018-05-09 ENCOUNTER — Ambulatory Visit
Admission: RE | Admit: 2018-05-09 | Discharge: 2018-05-09 | Disposition: A | Discharge status: Home / Self Care | Payer: Medicare Other | Source: Ambulatory Visit | Attending: Radiation Oncology | Admitting: Radiation Oncology

## 2018-05-09 DIAGNOSIS — Z170 Estrogen receptor positive status [ER+]: Secondary | ICD-10-CM | POA: Diagnosis not present

## 2018-05-09 DIAGNOSIS — C50212 Malignant neoplasm of upper-inner quadrant of left female breast: Secondary | ICD-10-CM | POA: Diagnosis not present

## 2018-05-10 ENCOUNTER — Ambulatory Visit
Admission: RE | Admit: 2018-05-10 | Discharge: 2018-05-10 | Disposition: A | Discharge status: Home / Self Care | Payer: Medicare Other | Source: Ambulatory Visit | Attending: Radiation Oncology | Admitting: Radiation Oncology

## 2018-05-10 DIAGNOSIS — Z170 Estrogen receptor positive status [ER+]: Secondary | ICD-10-CM | POA: Diagnosis not present

## 2018-05-10 DIAGNOSIS — C50212 Malignant neoplasm of upper-inner quadrant of left female breast: Secondary | ICD-10-CM | POA: Diagnosis not present

## 2018-05-11 ENCOUNTER — Ambulatory Visit
Admission: RE | Admit: 2018-05-11 | Discharge: 2018-05-11 | Disposition: A | Discharge status: Home / Self Care | Payer: Medicare Other | Source: Ambulatory Visit | Attending: Radiation Oncology | Admitting: Radiation Oncology

## 2018-05-11 DIAGNOSIS — Z170 Estrogen receptor positive status [ER+]: Secondary | ICD-10-CM | POA: Diagnosis not present

## 2018-05-11 DIAGNOSIS — C50212 Malignant neoplasm of upper-inner quadrant of left female breast: Secondary | ICD-10-CM | POA: Diagnosis not present

## 2018-05-16 ENCOUNTER — Other Ambulatory Visit: Payer: Self-pay | Admitting: Radiation Oncology

## 2018-05-16 ENCOUNTER — Telehealth: Payer: Self-pay | Admitting: *Deleted

## 2018-05-16 ENCOUNTER — Observation Stay (HOSPITAL_COMMUNITY)
Admission: RE | Admit: 2018-05-16 | Discharge: 2018-05-16 | Disposition: A | Discharge status: Home / Self Care | Payer: Medicare Other | Source: Ambulatory Visit | Attending: Radiation Oncology | Admitting: Radiation Oncology

## 2018-05-16 ENCOUNTER — Ambulatory Visit
Admission: RE | Admit: 2018-05-16 | Discharge: 2018-05-16 | Disposition: A | Discharge status: Home / Self Care | Payer: Medicare Other | Source: Ambulatory Visit | Attending: Radiation Oncology | Admitting: Radiation Oncology

## 2018-05-16 DIAGNOSIS — Z170 Estrogen receptor positive status [ER+]: Secondary | ICD-10-CM | POA: Insufficient documentation

## 2018-05-16 DIAGNOSIS — C50212 Malignant neoplasm of upper-inner quadrant of left female breast: Secondary | ICD-10-CM

## 2018-05-16 DIAGNOSIS — Z510 Encounter for antineoplastic radiation therapy: Secondary | ICD-10-CM | POA: Insufficient documentation

## 2018-05-16 DIAGNOSIS — M7989 Other specified soft tissue disorders: Secondary | ICD-10-CM | POA: Diagnosis not present

## 2018-05-16 NOTE — Progress Notes (Signed)
I spoke with ultrasound regarding venous dopplers ordered for Tracy Howell today. The order put in stated the ultrasound was of the Right Upper extremity. Dr. Isidore Moos would like the ultrasound to be of the left upper extremity. Order has been fixed in [place]. Ultrasound voiced their understanding of the change from right to left. They would call us if there were any other questions.

## 2018-05-16 NOTE — Progress Notes (Signed)
Left upper extremity venous duplex has been completed. Negative for DVT. Results were given to Dr. Isidore Moos.  05/16/18 10:44 AM Tracy Howell RVT

## 2018-05-16 NOTE — Telephone Encounter (Signed)
CALLED PATIENT TO INFORM OF DOPPLER FOR 05-16-18 - ARRIVAL TIME- 9:45 AM @ [facility] ADMITTING - TEST TO BE DONE @ 10 AM @ [facility] , SPOKE WITH PATIENT AND SHE IS AWARE OF THIS TEST

## 2018-05-16 NOTE — Addendum Note (Signed)
Addended by: Wyvonnia Lora on: 05/16/2018 10:08 AM   Modules accepted: Orders

## 2018-05-17 ENCOUNTER — Ambulatory Visit
Admission: RE | Admit: 2018-05-17 | Discharge: 2018-05-17 | Disposition: A | Discharge status: Home / Self Care | Payer: Medicare Other | Source: Ambulatory Visit | Attending: Radiation Oncology | Admitting: Radiation Oncology

## 2018-05-17 DIAGNOSIS — C50212 Malignant neoplasm of upper-inner quadrant of left female breast: Secondary | ICD-10-CM | POA: Diagnosis not present

## 2018-05-17 DIAGNOSIS — Z510 Encounter for antineoplastic radiation therapy: Secondary | ICD-10-CM | POA: Diagnosis not present

## 2018-05-17 DIAGNOSIS — Z170 Estrogen receptor positive status [ER+]: Secondary | ICD-10-CM | POA: Diagnosis not present

## 2018-05-18 ENCOUNTER — Ambulatory Visit
Admission: RE | Admit: 2018-05-18 | Discharge: 2018-05-18 | Disposition: A | Discharge status: Home / Self Care | Payer: Medicare Other | Source: Ambulatory Visit | Attending: Radiation Oncology | Admitting: Radiation Oncology

## 2018-05-18 DIAGNOSIS — Z170 Estrogen receptor positive status [ER+]: Secondary | ICD-10-CM | POA: Diagnosis not present

## 2018-05-18 DIAGNOSIS — Z510 Encounter for antineoplastic radiation therapy: Secondary | ICD-10-CM | POA: Diagnosis not present

## 2018-05-18 DIAGNOSIS — C50212 Malignant neoplasm of upper-inner quadrant of left female breast: Secondary | ICD-10-CM | POA: Diagnosis not present

## 2018-05-19 ENCOUNTER — Ambulatory Visit
Admission: RE | Admit: 2018-05-19 | Discharge: 2018-05-19 | Disposition: A | Discharge status: Home / Self Care | Payer: Medicare Other | Source: Ambulatory Visit | Attending: Radiation Oncology | Admitting: Radiation Oncology

## 2018-05-19 DIAGNOSIS — Z510 Encounter for antineoplastic radiation therapy: Secondary | ICD-10-CM | POA: Diagnosis not present

## 2018-05-19 DIAGNOSIS — C50212 Malignant neoplasm of upper-inner quadrant of left female breast: Secondary | ICD-10-CM | POA: Diagnosis not present

## 2018-05-19 DIAGNOSIS — Z170 Estrogen receptor positive status [ER+]: Secondary | ICD-10-CM | POA: Diagnosis not present

## 2018-05-20 ENCOUNTER — Other Ambulatory Visit: Payer: Medicare Other

## 2018-05-20 ENCOUNTER — Ambulatory Visit: Payer: Medicare Other

## 2018-05-20 ENCOUNTER — Ambulatory Visit
Admission: RE | Admit: 2018-05-20 | Discharge: 2018-05-20 | Disposition: A | Discharge status: Home / Self Care | Payer: Medicare Other | Source: Ambulatory Visit | Attending: Radiation Oncology | Admitting: Radiation Oncology

## 2018-05-20 DIAGNOSIS — Z510 Encounter for antineoplastic radiation therapy: Secondary | ICD-10-CM | POA: Diagnosis not present

## 2018-05-20 DIAGNOSIS — Z170 Estrogen receptor positive status [ER+]: Secondary | ICD-10-CM | POA: Diagnosis not present

## 2018-05-20 DIAGNOSIS — C50212 Malignant neoplasm of upper-inner quadrant of left female breast: Secondary | ICD-10-CM | POA: Diagnosis not present

## 2018-05-23 ENCOUNTER — Ambulatory Visit
Admission: RE | Admit: 2018-05-23 | Discharge: 2018-05-23 | Disposition: A | Discharge status: Home / Self Care | Payer: Medicare Other | Source: Ambulatory Visit | Attending: Radiation Oncology | Admitting: Radiation Oncology

## 2018-05-23 DIAGNOSIS — Z510 Encounter for antineoplastic radiation therapy: Secondary | ICD-10-CM | POA: Diagnosis not present

## 2018-05-23 DIAGNOSIS — Z170 Estrogen receptor positive status [ER+]: Secondary | ICD-10-CM | POA: Diagnosis not present

## 2018-05-23 DIAGNOSIS — C50212 Malignant neoplasm of upper-inner quadrant of left female breast: Secondary | ICD-10-CM | POA: Diagnosis not present

## 2018-05-24 ENCOUNTER — Inpatient Hospital Stay: Payer: Medicare Other | Admitting: Genetics

## 2018-05-24 ENCOUNTER — Telehealth: Payer: Self-pay | Admitting: Genetics

## 2018-05-24 ENCOUNTER — Inpatient Hospital Stay: Payer: Medicare Other

## 2018-05-24 ENCOUNTER — Inpatient Hospital Stay: Payer: Medicare Other | Attending: Oncology

## 2018-05-24 ENCOUNTER — Ambulatory Visit
Admission: RE | Admit: 2018-05-24 | Discharge: 2018-05-24 | Disposition: A | Discharge status: Home / Self Care | Payer: Medicare Other | Source: Ambulatory Visit | Attending: Radiation Oncology | Admitting: Radiation Oncology

## 2018-05-24 ENCOUNTER — Telehealth: Payer: Self-pay | Admitting: *Deleted

## 2018-05-24 VITALS — BP 151/73 | HR 84 | Temp 98.6°F | Resp 16

## 2018-05-24 DIAGNOSIS — Z170 Estrogen receptor positive status [ER+]: Secondary | ICD-10-CM | POA: Insufficient documentation

## 2018-05-24 DIAGNOSIS — Z5112 Encounter for antineoplastic immunotherapy: Secondary | ICD-10-CM | POA: Insufficient documentation

## 2018-05-24 DIAGNOSIS — Z95828 Presence of other vascular implants and grafts: Secondary | ICD-10-CM

## 2018-05-24 DIAGNOSIS — C50212 Malignant neoplasm of upper-inner quadrant of left female breast: Secondary | ICD-10-CM | POA: Diagnosis not present

## 2018-05-24 DIAGNOSIS — Z510 Encounter for antineoplastic radiation therapy: Secondary | ICD-10-CM | POA: Diagnosis not present

## 2018-05-24 LAB — CBC WITH DIFFERENTIAL (CANCER CENTER ONLY)
Abs Immature Granulocytes: 0.02 10*3/uL (ref 0.00–0.07)
Basophils Absolute: 0 10*3/uL (ref 0.0–0.1)
Basophils Relative: 0 %
Eosinophils Absolute: 0.2 10*3/uL (ref 0.0–0.5)
Eosinophils Relative: 3 %
HCT: 37.1 % (ref 36.0–46.0)
Hemoglobin: 11.7 g/dL — ABNORMAL LOW (ref 12.0–15.0)
Immature Granulocytes: 0 %
Lymphocytes Relative: 22 %
Lymphs Abs: 1 10*3/uL (ref 0.7–4.0)
MCH: 29.4 pg (ref 26.0–34.0)
MCHC: 31.5 g/dL (ref 30.0–36.0)
MCV: 93.2 fL (ref 80.0–100.0)
MONO ABS: 0.4 10*3/uL (ref 0.1–1.0)
MONOS PCT: 9 %
Neutro Abs: 3 10*3/uL (ref 1.7–7.7)
Neutrophils Relative %: 66 %
Platelet Count: 239 10*3/uL (ref 150–400)
RBC: 3.98 MIL/uL (ref 3.87–5.11)
RDW: 13.7 % (ref 11.5–15.5)
WBC Count: 4.6 10*3/uL (ref 4.0–10.5)
nRBC: 0 % (ref 0.0–0.2)

## 2018-05-24 LAB — CMP (CANCER CENTER ONLY)
ALK PHOS: 120 U/L (ref 38–126)
ALT: 10 U/L (ref 0–44)
AST: 29 U/L (ref 15–41)
Albumin: 3.3 g/dL — ABNORMAL LOW (ref 3.5–5.0)
Anion gap: 9 (ref 5–15)
BUN: 10 mg/dL (ref 8–23)
CALCIUM: 9.5 mg/dL (ref 8.9–10.3)
CO2: 28 mmol/L (ref 22–32)
Chloride: 105 mmol/L (ref 98–111)
Creatinine: 0.8 mg/dL (ref 0.44–1.00)
GFR, Est AFR Am: >60 mL/min (ref >60)
GFR, Estimated: >60 mL/min (ref >60)
GLUCOSE: 84 mg/dL (ref 70–99)
Potassium: 3.8 mmol/L (ref 3.5–5.1)
Sodium: 142 mmol/L (ref 135–145)
Total Bilirubin: 0.7 mg/dL (ref 0.3–1.2)
Total Protein: 7.7 g/dL (ref 6.5–8.1)

## 2018-05-24 MED ORDER — DIPHENHYDRAMINE HCL 25 MG PO CAPS
25.0000 mg | ORAL_CAPSULE | Freq: Once | ORAL | Status: AC
  Administered 2018-05-24: 25 mg via ORAL

## 2018-05-24 MED ORDER — SODIUM CHLORIDE 0.9% FLUSH
10.0000 mL | Freq: Once | INTRAVENOUS | Status: AC
  Administered 2018-05-24: 10 mL
  Filled 2018-05-24: qty 10

## 2018-05-24 MED ORDER — ACETAMINOPHEN 325 MG PO TABS
650.0000 mg | ORAL_TABLET | Freq: Once | ORAL | Status: AC
  Administered 2018-05-24: 650 mg via ORAL

## 2018-05-24 MED ORDER — ACETAMINOPHEN 325 MG PO TABS
ORAL_TABLET | ORAL | Status: AC
  Filled 2018-05-24: qty 2

## 2018-05-24 MED ORDER — SODIUM CHLORIDE 0.9 % IV SOLN
Freq: Once | INTRAVENOUS | Status: AC
  Administered 2018-05-24: 09:00:00 via INTRAVENOUS
  Filled 2018-05-24: qty 250

## 2018-05-24 MED ORDER — SODIUM CHLORIDE 0.9% FLUSH
10.0000 mL | INTRAVENOUS | Status: DC | PRN
  Administered 2018-05-24: 10 mL
  Filled 2018-05-24: qty 10

## 2018-05-24 MED ORDER — DIPHENHYDRAMINE HCL 25 MG PO CAPS
ORAL_CAPSULE | ORAL | Status: AC
  Filled 2018-05-24: qty 1

## 2018-05-24 MED ORDER — TRASTUZUMAB CHEMO 150 MG IV SOLR
600.0000 mg | Freq: Once | INTRAVENOUS | Status: AC
  Administered 2018-05-24: 600 mg via INTRAVENOUS
  Filled 2018-05-24: qty 28.57

## 2018-05-24 MED ORDER — HEPARIN SOD (PORK) LOCK FLUSH 100 UNIT/ML IV SOLN
500.0000 [IU] | Freq: Once | INTRAVENOUS | Status: AC | PRN
  Administered 2018-05-24: 500 [IU]
  Filled 2018-05-24: qty 5

## 2018-05-24 NOTE — Telephone Encounter (Signed)
This RN was notified by Treatment room nurse - pt was informed today she would be getting zometa with the herceptin.  Pt declined getting the zometa due to " he never discussed this with me ".  This RN will contact pt and discuss further due to noted in last dictation zometa discussed with need for pt to obtain dental clearance.  Pt is scheduled for next herceptin on 12/31 post MD visit.  Plan is this RN will contact pt to discuss med and dental clearance and then pt can receive the zometa on 06/14/2018 if all goes well.

## 2018-05-24 NOTE — Telephone Encounter (Signed)
Patient had questions about billing for genetics.   I informed her she meets medicare criteria for genetic testing and I expect her lab test to likely be completely covered.  I do not have any information about billing/coverage of appointments.    Tracy Howell did not want to come to her genetics appointment without knowing the cost for the appointment.  I informed her I would reach out to our billing department to try to obtain more information for her.  In the meantime she would like to cancel her appointment and reschedule after she gets billing info.

## 2018-05-25 ENCOUNTER — Ambulatory Visit
Admission: RE | Admit: 2018-05-25 | Discharge: 2018-05-25 | Disposition: A | Discharge status: Home / Self Care | Payer: Medicare Other | Source: Ambulatory Visit | Attending: Radiation Oncology | Admitting: Radiation Oncology

## 2018-05-25 DIAGNOSIS — C50212 Malignant neoplasm of upper-inner quadrant of left female breast: Secondary | ICD-10-CM | POA: Diagnosis not present

## 2018-05-25 DIAGNOSIS — Z510 Encounter for antineoplastic radiation therapy: Secondary | ICD-10-CM | POA: Diagnosis not present

## 2018-05-25 DIAGNOSIS — Z170 Estrogen receptor positive status [ER+]: Secondary | ICD-10-CM | POA: Diagnosis not present

## 2018-05-25 LAB — CANCER ANTIGEN 27.29: CA 27.29: 19.2 U/mL (ref 0.0–38.6)

## 2018-05-26 ENCOUNTER — Ambulatory Visit: Payer: Medicare Other | Attending: Radiation Oncology | Admitting: Physical Therapy

## 2018-05-26 ENCOUNTER — Other Ambulatory Visit: Payer: Self-pay

## 2018-05-26 ENCOUNTER — Ambulatory Visit
Admission: RE | Admit: 2018-05-26 | Discharge: 2018-05-26 | Disposition: A | Discharge status: Home / Self Care | Payer: Medicare Other | Source: Ambulatory Visit | Attending: Radiation Oncology | Admitting: Radiation Oncology

## 2018-05-26 ENCOUNTER — Encounter: Payer: Self-pay | Admitting: Physical Therapy

## 2018-05-26 DIAGNOSIS — C50212 Malignant neoplasm of upper-inner quadrant of left female breast: Secondary | ICD-10-CM | POA: Insufficient documentation

## 2018-05-26 DIAGNOSIS — I972 Postmastectomy lymphedema syndrome: Secondary | ICD-10-CM

## 2018-05-26 DIAGNOSIS — Z170 Estrogen receptor positive status [ER+]: Secondary | ICD-10-CM | POA: Insufficient documentation

## 2018-05-26 DIAGNOSIS — R293 Abnormal posture: Secondary | ICD-10-CM | POA: Insufficient documentation

## 2018-05-26 DIAGNOSIS — Z510 Encounter for antineoplastic radiation therapy: Secondary | ICD-10-CM | POA: Diagnosis not present

## 2018-05-26 NOTE — Therapy (Signed)
[place], [place], [address] Phone: [phone]   Fax:  [phone]  Physical Therapy Evaluation  Patient Details  Name: Tracy Howell MRN: [id number] Date of Birth: Jul 16, 1945 Referring Provider (PT): Dr. Isidore Moos   Encounter Date: 05/26/2018  PT End of Session - 05/26/18 1227    Visit Number  1    Number of Visits  9    Date for PT Re-Evaluation  07/21/18    PT Start Time  0928    PT Stop Time  1020    PT Time Calculation (min)  52 min    Activity Tolerance  Patient tolerated treatment well    Behavior During Therapy  [facility] for tasks assessed/performed       Past Medical History:  Diagnosis Date  . Angioedema   . Anxiety   . Breast cancer ([place]) 2013  . Complication of anesthesia    slow to wake up   . Pneumonia    hx of x 2   . recurrent left breast ca dx'd 04/2017  . SBO (small bowel obstruction) ([place]) 08/13/2014    Past Surgical History:  Procedure Laterality Date  . ABDOMINAL HYSTERECTOMY    . APPENDECTOMY    . APPLICATION OF WOUND VAC Left 03/21/2018   Procedure: APPLICATION OF WOUND VAC;  Surgeon: Irene Limbo, MD;  Location: [facility];  Service: Plastics;  Laterality: Left;  . BREAST LUMPECTOMY  2013  . BREAST SURGERY    . CESAREAN SECTION     x2  . CHOLECYSTECTOMY    . CYST EXCISION     on abdomen  . MASTECTOMY WITH AXILLARY LYMPH NODE DISSECTION Left 03/21/2018   Procedure: LEFT MASTECTOMY WITH AXILLARY LYMPH NODE DISSECTION;  Surgeon: Alphonsa Overall, MD;  Location: [facility];  Service: General;  Laterality: Left;  . PORTACATH PLACEMENT Right 05/17/2017   Procedure: INSERTION PORT-A-CATH right subclavian vein;  Surgeon: Alphonsa Overall, MD;  [facility];  Service: General;  Laterality: Right;  . TRAM Left 03/21/2018   Procedure: ADJACENT TISSUE TRANSFER LEFT CHEST AND ABDOMEN TO LEFT CHEST;  Surgeon: Irene Limbo, MD;  Location: [facility];  Service: Plastics;   Laterality: Left;    There were no vitals filed for this visit.   Subjective Assessment - 05/26/18 0931    Subjective  I started noticing my swelling a little over a week ago. I had a mastectomy about 4 weeks ago. I have one area that has not healed from the surgery and while I was bathing I knocked the scab off. There is a little bit of drainage. I am going to finish radiation on Jun 06, 2018.     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 The mass appears to be now involving her skin. She has a previous left breast cancer from 2012 which was grade 2 invasive cancer. It was treated with surgery but she declined chemo and radiation., L mastectomy on 03/21/18, pt has completed chemo and is currently doing radiation her last treatment is Dec 23    Patient Stated Goals  to get arm swelling down and prevent it from swelling more    Currently in Pain?  Yes    Pain Score  3     Pain Location  Shoulder    Pain Orientation  Left    Pain Descriptors / Indicators  Sore    Pain Type  Surgical pain    Pain Onset  More than a month ago    Pain Frequency  Intermittent    Aggravating Factors   moving the arm    Pain Relieving Factors  sleeping    Effect of Pain on Daily Activities  pt states she works through the pain         [facility] PT Assessment - 05/26/18 0001      Assessment   Medical Diagnosis  Left breast cancer    Referring Provider (PT)  Dr. Isidore Moos    Onset Date/Surgical Date  03/21/18    Hand Dominance  Right    Prior Therapy  none      Precautions   Precautions  Other (comment)    Precaution Comments  lymphedema      Restrictions   Weight Bearing Restrictions  No      Balance Screen   Has the patient fallen in the past 6 months  Yes    How many times?  1 tripped over flower pot pt reports she did not see it    Has  the patient had a decrease in activity level because of a fear of falling?   No    Is the patient reluctant to leave their home because of a fear of falling?   No      Home Environment   Living Environment  Private residence    Living Arrangements  Spouse/significant other    Available Help at Discharge  Family      Prior Function   Level of Independence  Independent    Vocation  Part time employment    Loss adjuster, chartered    Leisure  She does not exercise      Cognition   Overall Cognitive Status  Within Functional Limits for tasks assessed      Posture/Postural Control   Posture/Postural Control  Postural limitations    Postural Limitations  Rounded Shoulders;Forward head      AROM   Overall AROM   Within functional limits for tasks performed    Right Shoulder Extension  --    Right Shoulder Flexion  --    Right Shoulder ABduction  --    Right Shoulder Internal Rotation  --    Right Shoulder External Rotation  --    Left Shoulder Extension  --    Left Shoulder Flexion  --    Left Shoulder ABduction  --    Left Shoulder Internal Rotation  --    Left Shoulder External Rotation  --    Cervical Flexion  --    Cervical Extension  --    Cervical - Right Side Bend  --    Cervical - Left Side Bend  --    Cervical - Right Rotation  --    Cervical - Left Rotation  --      Strength   Overall Strength  --        LYMPHEDEMA/ONCOLOGY QUESTIONNAIRE - 05/26/18 0946      Type   Cancer Type  Left breast cancer      Surgeries   Mastectomy Date  03/21/18    Lumpectomy Date  06/15/10    Sentinel Lymph Node Biopsy Date  03/21/18    Number Lymph Nodes Removed  8   6 were positive     Date Lymphedema/Swelling Started   Date  03/21/18  Treatment   Active Chemotherapy Treatment  No    Past Chemotherapy Treatment  Yes    Active Radiation Treatment  Yes    Past Radiation Treatment  No    Current Hormone Treatment  Yes    Drug Name  Tamoxifen    Past Hormone Therapy   No      What other symptoms do you have   Are you Having Heaviness or Tightness  Yes   tightness across left chest   Are you having Pain  Yes    Are you having pitting edema  No    Is it Hard or Difficult finding clothes that fit  No    Do you have infections  No    Is there Decreased scar mobility  Yes      Right Upper Extremity Lymphedema   15 cm Proximal to Olecranon Process  37 cm    Olecranon Process  29.1 cm    10 cm Proximal to Ulnar Styloid Process  26.6 cm    Just Proximal to Ulnar Styloid Process  19.6 cm    Across Hand at [employer]  21 cm    At [place] of 2nd Digit  7.4 cm      Left Upper Extremity Lymphedema   15 cm Proximal to Olecranon Process  41 cm    Olecranon Process  31.7 cm    10 cm Proximal to Ulnar Styloid Process  26.9 cm    Just Proximal to Ulnar Styloid Process  20.8 cm    Across Hand at [employer]  20.8 cm    At [place] of 2nd Digit  7.2 cm             Objective measurements completed on examination: See above findings.      [address] Adult PT Treatment/Exercise - 05/26/18 0001      Manual Therapy   Manual Therapy  Edema management    Edema Management  measured pt for off the shelf compression sleeve and glove today and gave information to pt on how to obtain, created foam chip pack for pt to wear in bra to help increase compression at left lateral trunk             PT Education - 05/26/18 1235    Education provided  Yes    Education Details  lymphedema risk reduction, how to obtain compression sleeve    Person(s) Educated  Patient;Spouse    Methods  Explanation;Handout    Comprehension  Verbalized understanding          PT Long Term Goals - 05/26/18 1233      PT LONG TERM GOAL #1   Title  Pt and husband will be able to independently manage LUE lymphedema through self MLD and compression garments    Time  8    Period  Weeks    Status  New    Target Date  07/21/17      PT LONG TERM GOAL #2   Title  Pt will obtain  appropriate compression garments for long term management of LUE lymphedema    Time  8    Period  Weeks    Status  New    Target Date  07/21/17      PT LONG TERM GOAL #3   Title  Pt will report a 50% decrease in tightness across left chest to allow improved comfort    Time  8    Period  Weeks  Status  New    Target Date  07/21/17             Plan - 05/26/18 1228    Clinical Impression Statement  Pt presents to PT following a left mastectomy on 03/21/18. She is currently undergoing radiation and will complete it on 06/06/18. She has previous history of left lumpectomy for treatment of L breast cancer with reoccured. Pt is now having lymphedema in her LUE and L trunk and would benefit from skilled PT services to decrease left UE lymphedema, educate pt on independent management, assist with obtain appropriate compression garments and improve scar mobility.     History and Personal Factors relevant to plan of care:  pt will only be here 1 week of the month after 06/06/18, she lives in [place] due to:  pt is undergoing radiation    Rehab Potential  Good    Clinical Impairments Affecting Rehab Potential  pt undergoing radiation, is only here for 1 week a month after 06/06/18    PT Frequency  3x / week   only while pt is in town which is 1 week a month   PT Duration  8 weeks    PT Treatment/Interventions  ADLs/Self Care Home Management;Patient/family education;Therapeutic exercise;Scar mobilization;Manual techniques;Passive range of motion;Manual lymph drainage;Compression bandaging;Orthotic Fit/Training    PT Next Visit Plan  see if pt obtained compression sleeve (gave info to pt to order ExoStrong from [email or web address]), begin MLD to LUE and lateral trunk and instruct husband and give handout, scar mobilization beginning in Jan if scar is healed    Consulted and Agree with Plan of Care  Patient;Family member/caregiver     Family Member Consulted  husband       Patient will benefit from skilled therapeutic intervention in order to improve the following deficits and impairments:  Increased edema, Decreased scar mobility, Decreased knowledge of precautions  Visit Diagnosis: Postmastectomy lymphedema     Problem List Patient Active Problem List   Diagnosis Date Noted  . Breast cancer, left breast ([place]) 03/21/2018  . Port-A-Cath in place 07/05/2017  . Lung metastases ([facility]) 05/21/2017  . Goals of care, counseling/discussion 05/21/2017  . Malignant neoplasm of upper-inner quadrant of left breast in female, estrogen receptor positive ([place]) 05/07/2017  . SBO (small bowel obstruction) ([place]) 08/13/2014  . History of angioedema 08/13/2014  . History of breast cancer 08/13/2014    Allyson Sabal [facility] 05/26/2018, 12:35 PM  [place], [place], [address] Phone: [phone]   Fax:  [phone]  Name: Tracy Howell MRN: [id number] Date of Birth: 1945-06-20  Allyson Sabal [place], PT 05/26/18 12:36 PM

## 2018-05-27 ENCOUNTER — Ambulatory Visit
Admission: RE | Admit: 2018-05-27 | Discharge: 2018-05-27 | Disposition: A | Discharge status: Home / Self Care | Payer: Medicare Other | Source: Ambulatory Visit | Attending: Radiation Oncology | Admitting: Radiation Oncology

## 2018-05-27 DIAGNOSIS — C50212 Malignant neoplasm of upper-inner quadrant of left female breast: Secondary | ICD-10-CM | POA: Diagnosis not present

## 2018-05-27 DIAGNOSIS — Z170 Estrogen receptor positive status [ER+]: Secondary | ICD-10-CM | POA: Diagnosis not present

## 2018-05-27 DIAGNOSIS — Z510 Encounter for antineoplastic radiation therapy: Secondary | ICD-10-CM | POA: Diagnosis not present

## 2018-05-30 ENCOUNTER — Ambulatory Visit
Admission: RE | Admit: 2018-05-30 | Discharge: 2018-05-30 | Disposition: A | Discharge status: Home / Self Care | Payer: Medicare Other | Source: Ambulatory Visit | Attending: Radiation Oncology | Admitting: Radiation Oncology

## 2018-05-30 DIAGNOSIS — Z510 Encounter for antineoplastic radiation therapy: Secondary | ICD-10-CM | POA: Diagnosis not present

## 2018-05-30 DIAGNOSIS — C50212 Malignant neoplasm of upper-inner quadrant of left female breast: Secondary | ICD-10-CM | POA: Diagnosis not present

## 2018-05-30 DIAGNOSIS — Z170 Estrogen receptor positive status [ER+]: Secondary | ICD-10-CM | POA: Diagnosis not present

## 2018-05-31 ENCOUNTER — Ambulatory Visit
Admission: RE | Admit: 2018-05-31 | Discharge: 2018-05-31 | Disposition: A | Discharge status: Home / Self Care | Payer: Medicare Other | Source: Ambulatory Visit | Attending: Radiation Oncology | Admitting: Radiation Oncology

## 2018-05-31 DIAGNOSIS — C50212 Malignant neoplasm of upper-inner quadrant of left female breast: Secondary | ICD-10-CM | POA: Diagnosis not present

## 2018-05-31 DIAGNOSIS — Z170 Estrogen receptor positive status [ER+]: Secondary | ICD-10-CM | POA: Diagnosis not present

## 2018-05-31 DIAGNOSIS — Z510 Encounter for antineoplastic radiation therapy: Secondary | ICD-10-CM | POA: Diagnosis not present

## 2018-06-01 ENCOUNTER — Ambulatory Visit
Admission: RE | Admit: 2018-06-01 | Discharge: 2018-06-01 | Disposition: A | Discharge status: Home / Self Care | Payer: Medicare Other | Source: Ambulatory Visit | Attending: Radiation Oncology | Admitting: Radiation Oncology

## 2018-06-01 ENCOUNTER — Ambulatory Visit: Payer: Medicare Other

## 2018-06-01 DIAGNOSIS — Z170 Estrogen receptor positive status [ER+]: Secondary | ICD-10-CM

## 2018-06-01 DIAGNOSIS — C50212 Malignant neoplasm of upper-inner quadrant of left female breast: Secondary | ICD-10-CM | POA: Diagnosis not present

## 2018-06-01 DIAGNOSIS — R293 Abnormal posture: Secondary | ICD-10-CM | POA: Diagnosis not present

## 2018-06-01 DIAGNOSIS — Z510 Encounter for antineoplastic radiation therapy: Secondary | ICD-10-CM | POA: Diagnosis not present

## 2018-06-01 DIAGNOSIS — I972 Postmastectomy lymphedema syndrome: Secondary | ICD-10-CM

## 2018-06-01 NOTE — Therapy (Addendum)
[place], [place], [address] Phone: [phone]   Fax:  [phone]  Physical Therapy Treatment  Patient Details  Name: Tracy Howell MRN: [id number] Date of Birth: 1945/09/03 Referring Provider (PT): Dr. Isidore Moos   Encounter Date: 06/01/2018  PT End of Session - 06/01/18 1021    Visit Number  2    Number of Visits  9    Date for PT Re-Evaluation  07/21/18    PT Start Time  0937    PT Stop Time  1020    PT Time Calculation (min)  43 min    Activity Tolerance  Patient tolerated treatment well    Behavior During Therapy  [facility] for tasks assessed/performed       Past Medical History:  Diagnosis Date  . Angioedema   . Anxiety   . Breast cancer ([place]) 2013  . Complication of anesthesia    slow to wake up   . Pneumonia    hx of x 2   . recurrent left breast ca dx'd 04/2017  . SBO (small bowel obstruction) ([place]) 08/13/2014    Past Surgical History:  Procedure Laterality Date  . ABDOMINAL HYSTERECTOMY    . APPENDECTOMY    . APPLICATION OF WOUND VAC Left 03/21/2018   Procedure: APPLICATION OF WOUND VAC;  Surgeon: Irene Limbo, MD;  Location: [facility];  Service: Plastics;  Laterality: Left;  . BREAST LUMPECTOMY  2013  . BREAST SURGERY    . CESAREAN SECTION     x2  . CHOLECYSTECTOMY    . CYST EXCISION     on abdomen  . MASTECTOMY WITH AXILLARY LYMPH NODE DISSECTION Left 03/21/2018   Procedure: LEFT MASTECTOMY WITH AXILLARY LYMPH NODE DISSECTION;  Surgeon: Alphonsa Overall, MD;  Location: [facility];  Service: General;  Laterality: Left;  . PORTACATH PLACEMENT Right 05/17/2017   Procedure: INSERTION PORT-A-CATH right subclavian vein;  Surgeon: Alphonsa Overall, MD;  [facility];  Service: General;  Laterality: Right;  . TRAM Left 03/21/2018   Procedure: ADJACENT TISSUE TRANSFER LEFT CHEST AND ABDOMEN TO LEFT CHEST;  Surgeon: Irene Limbo, MD;  Location: [facility];  Service: Plastics;   Laterality: Left;    There were no vitals filed for this visit.  Subjective Assessment - 06/01/18 0947    Subjective  I ordered and received my Exostrong compression sleeve and Isotoner compression glove.     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 The mass appears to be now involving her skin. She has a previous left breast cancer from 2012 which was grade 2 invasive cancer. It was treated with surgery but she declined chemo and radiation., L mastectomy on 03/21/18, pt has completed chemo and is currently doing radiation her last treatment is Dec 23    Patient Stated Goals  to get arm swelling down and prevent it from swelling more    Currently in Pain?  No/denies                  Outpatient Rehab from [facility]/26/2018 [facility]  Lymphedema Life Impact Scale Total Score  30.88 %           [facility] Adult PT Treatment/Exercise - 06/01/18 0001      Manual Therapy   Manual Therapy  Manual Lymphatic  Drainage (MLD)    Edema Management  Pt brought her new compression sleeve and glove and donned these for her assessing fit which was deemed good and pt reported comfortable.     Manual Lymphatic Drainage (MLD)  In Supine: Short neck, 5 diaphragmatic breaths, Lt inguinal nodes (avoided Rt axillary as pt has had node removed), Lt axillo-inguinal anastomosis, then Lt UE working from lateral upper arm to dorsal hand working from proximal to distal then retracing all steps beginning to instruct pt and husband throughout.                  PT Long Term Goals - 05/26/18 1233      PT LONG TERM GOAL #1   Title  Pt and husband will be able to independently manage LUE lymphedema through self MLD and compression garments    Time  8    Period  Weeks    Status  New    Target Date   07/21/17      PT LONG TERM GOAL #2   Title  Pt will obtain appropriate compression garments for long term management of LUE lymphedema    Time  8    Period  Weeks    Status  New    Target Date  07/21/17      PT LONG TERM GOAL #3   Title  Pt will report a 50% decrease in tightness across left chest to allow improved comfort    Time  8    Period  Weeks    Status  New    Target Date  07/21/17            Plan - 06/01/18 1021    Clinical Impression Statement  First session of manual lymph drainage today. Pt tolerated this very well reporting less heaviness to arm after session. Also donned her new compression sleeve and glove which was a good fit. Began instructing pt and husband in anatomy of lymphatic system and sequence of MLD.     Rehab Potential  Good    Clinical Impairments Affecting Rehab Potential  pt undergoing radiation, is only here for 1 week a month after 06/06/18    PT Frequency  3x / week   only while pt in town which is 1 week a month   PT Duration  8 weeks    PT Treatment/Interventions  ADLs/Self Care Home Management;Patient/family education;Therapeutic exercise;Scar mobilization;Manual techniques;Passive range of motion;Manual lymph drainage;Compression bandaging;Orthotic Fit/Training    PT Next Visit Plan  Cont and instruct pt and husband in MLD to LUE and lateral trunk and give handout, scar mobilization beginning in Jan if scar is healed    Consulted and Agree with Plan of Care  Patient;Family member/caregiver    Family Member Consulted  husband       Patient will benefit from skilled therapeutic intervention in order to improve the following deficits and impairments:  Increased edema, Decreased scar mobility, Decreased knowledge of precautions  Visit Diagnosis: Postmastectomy lymphedema  Malignant neoplasm of upper-inner quadrant of left breast in female, estrogen receptor positive ([place])  Abnormal posture     Problem List Patient Active Problem List    Diagnosis Date Noted  . Breast cancer, left breast ([place]) 03/21/2018  . Port-A-Cath in place 07/05/2017  . Lung metastases ([place]) 05/21/2017  . Goals of care, counseling/discussion 05/21/2017  . Malignant neoplasm of upper-inner quadrant of left breast in female, estrogen receptor positive ([place]) 05/07/2017  . SBO (small bowel obstruction) ([place])  08/13/2014  . History of angioedema 08/13/2014  . History of breast cancer 08/13/2014    Otelia Limes, PTA 06/01/2018, 10:23 AM  [place], [place], [address] Phone: [phone]   Fax:  [phone]  Name: Tracy Howell MRN: [id number] Date of Birth: 27-Jun-1945  PHYSICAL THERAPY DISCHARGE SUMMARY  Visits from Start of Care: 2  Current functional level related to goals / functional outcomes: See above   Remaining deficits: See above   Education / Equipment: Anatomy and physiology of lymphatic system, self MLD  Plan: Patient agrees to discharge.  Patient goals were not met. Patient is being discharged due to not returning since the last visit.  ?????    Allyson Sabal [place], [place] 08/09/18 9:55 AM

## 2018-06-02 ENCOUNTER — Ambulatory Visit: Payer: Medicare Other | Admitting: Physical Therapy

## 2018-06-02 ENCOUNTER — Ambulatory Visit
Admission: RE | Admit: 2018-06-02 | Discharge: 2018-06-02 | Disposition: A | Discharge status: Home / Self Care | Payer: Medicare Other | Source: Ambulatory Visit | Attending: Radiation Oncology | Admitting: Radiation Oncology

## 2018-06-02 DIAGNOSIS — Z510 Encounter for antineoplastic radiation therapy: Secondary | ICD-10-CM | POA: Diagnosis not present

## 2018-06-02 DIAGNOSIS — C50212 Malignant neoplasm of upper-inner quadrant of left female breast: Secondary | ICD-10-CM | POA: Diagnosis not present

## 2018-06-02 DIAGNOSIS — Z170 Estrogen receptor positive status [ER+]: Secondary | ICD-10-CM | POA: Diagnosis not present

## 2018-06-03 ENCOUNTER — Ambulatory Visit
Admission: RE | Admit: 2018-06-03 | Discharge: 2018-06-03 | Disposition: A | Discharge status: Home / Self Care | Payer: Medicare Other | Source: Ambulatory Visit | Attending: Radiation Oncology | Admitting: Radiation Oncology

## 2018-06-03 ENCOUNTER — Ambulatory Visit: Payer: Medicare Other

## 2018-06-03 ENCOUNTER — Encounter

## 2018-06-03 DIAGNOSIS — Z170 Estrogen receptor positive status [ER+]: Secondary | ICD-10-CM | POA: Diagnosis not present

## 2018-06-03 DIAGNOSIS — C50212 Malignant neoplasm of upper-inner quadrant of left female breast: Secondary | ICD-10-CM | POA: Diagnosis not present

## 2018-06-03 DIAGNOSIS — Z510 Encounter for antineoplastic radiation therapy: Secondary | ICD-10-CM | POA: Diagnosis not present

## 2018-06-06 ENCOUNTER — Encounter: Payer: Medicare Other | Admitting: Physical Therapy

## 2018-06-06 ENCOUNTER — Ambulatory Visit: Payer: Medicare Other

## 2018-06-06 ENCOUNTER — Ambulatory Visit
Admission: RE | Admit: 2018-06-06 | Discharge: 2018-06-06 | Disposition: A | Discharge status: Home / Self Care | Payer: Medicare Other | Source: Ambulatory Visit | Attending: Radiation Oncology | Admitting: Radiation Oncology

## 2018-06-06 DIAGNOSIS — Z170 Estrogen receptor positive status [ER+]: Secondary | ICD-10-CM | POA: Diagnosis not present

## 2018-06-06 DIAGNOSIS — C50212 Malignant neoplasm of upper-inner quadrant of left female breast: Secondary | ICD-10-CM | POA: Diagnosis not present

## 2018-06-06 DIAGNOSIS — Z510 Encounter for antineoplastic radiation therapy: Secondary | ICD-10-CM | POA: Diagnosis not present

## 2018-06-07 ENCOUNTER — Ambulatory Visit: Payer: Medicare Other

## 2018-06-07 ENCOUNTER — Encounter: Payer: Self-pay | Admitting: Radiation Oncology

## 2018-06-07 NOTE — Progress Notes (Signed)
  Radiation Oncology         [phone]) [phone] ________________________________  Name: Tracy Howell MRN: [id number]  Date: 06/07/2018  DOB: 1946/06/06  End of Treatment Note  Diagnosis:   Metastatic Breast Cancer  (ypT4a, pN2a, cM1, G3, ER+, PR+, HER2+)   Indication for treatment:  Palliative / local control   Radiation treatment dates:   05/03/18 - 06/06/18  Site/dose:    1. Left Chest Wall / 50 Gy delivered in 25 fractions of 2 Gy, with 1 cm bolus QOD 2. Left Supraclavicular, SCV nodes / 50 Gy delivered in 25 fractions of 2 Gy  Beams/energy:    1. 3D, photons / 10X 2. 3D, photons / 10X  Narrative: The patient tolerated radiation treatment relatively well. She experienced hyperpigmentation and skin dryness. She did not use radiaplex as directed. She experienced scabbing to her mastectomy scars throughout treatment. She denied pain or fatigue.  Plan: The patient has completed radiation treatment. The patient will return to radiation oncology clinic for routine followup in one month. I advised them to call or return sooner if they have any questions or concerns related to their recovery or treatment.  -----------------------------------  Eppie Gibson, MD  This document serves as a record of services personally performed by Eppie Gibson, MD. It was created on her behalf by Wilburn Mylar, a trained medical scribe. The creation of this record is based on the scribe's personal observations and the provider's statements to them. This document has been checked and approved by the attending provider.

## 2018-06-09 ENCOUNTER — Ambulatory Visit: Payer: Medicare Other

## 2018-06-10 ENCOUNTER — Telehealth: Payer: Self-pay | Admitting: Oncology

## 2018-06-10 ENCOUNTER — Ambulatory Visit: Payer: Medicare Other

## 2018-06-10 ENCOUNTER — Other Ambulatory Visit: Payer: Medicare Other

## 2018-06-10 NOTE — Telephone Encounter (Signed)
Called patient per 12/27 sch message- left message for patient to call back and r.s

## 2018-06-13 ENCOUNTER — Telehealth: Payer: Self-pay | Admitting: Oncology

## 2018-06-13 ENCOUNTER — Telehealth: Payer: Self-pay

## 2018-06-13 ENCOUNTER — Ambulatory Visit: Payer: Medicare Other

## 2018-06-13 NOTE — Telephone Encounter (Signed)
R/s appt per 12/30 sch message - unable to reach patient - called twice - left message for patient with appt date and time

## 2018-06-13 NOTE — Telephone Encounter (Signed)
Received call from pt stating that she is unable to make her appt tomorrow 06/14/18 lab/fl/md/infusion (herceptin). She is all the way in [place] and her car broke and will need a few days to get it fixed. Pt is requesting to see if she can have her appt rescheduled to 07/08/18, when she comes to see radonc. Will send pt request to scheduling. Told pt to call by the end of next week, if she does not hear from scheduling. Pt verbalized understanding and appreciates time and call. No further needs at this time.

## 2018-06-14 ENCOUNTER — Other Ambulatory Visit: Payer: Medicare Other

## 2018-06-14 ENCOUNTER — Ambulatory Visit: Payer: Medicare Other

## 2018-06-14 ENCOUNTER — Ambulatory Visit: Payer: Medicare Other | Admitting: Oncology

## 2018-06-16 ENCOUNTER — Ambulatory Visit: Payer: Medicare Other

## 2018-06-17 ENCOUNTER — Ambulatory Visit: Payer: Medicare Other

## 2018-06-20 ENCOUNTER — Ambulatory Visit: Payer: Medicare Other

## 2018-06-21 ENCOUNTER — Ambulatory Visit: Payer: Medicare Other

## 2018-06-22 ENCOUNTER — Ambulatory Visit: Payer: Medicare Other

## 2018-07-06 ENCOUNTER — Encounter: Payer: Self-pay | Admitting: Radiation Oncology

## 2018-07-06 NOTE — Progress Notes (Signed)
Tracy Howell presents for follow up of radiation completed 06/06/18 to her Left chest wall and Left supraclavicular, SCV nodes. Her skin is hyperpigmented to her radiation site. She continues to have an open area along her incsion. She is applying neosporin to this area. SHe also continues to apply neosporin to the other areas of her radiation site. She was advised to use a vitamin E cream or oil. She will see Dr. Jana Hakim today and receive a herceptin infusion. She is taking tamoxifen.  BP 134/61 (BP Location: Right Arm, Patient Position: Sitting)   Pulse 90   Temp 97.7 F (36.5 C) (Oral)   Resp 20   Ht 5' 6.5" (1.689 m)   Wt 226 lb 6.4 oz (102.7 kg)   SpO2 100%   BMI 35.99 kg/m    Wt Readings from Last 3 Encounters:  07/08/18 226 lb 6.4 oz (102.7 kg)  05/03/18 226 lb 4 oz (102.6 kg)  04/08/18 225 lb 11.2 oz (102.4 kg)

## 2018-07-07 NOTE — Progress Notes (Signed)
[place]  [phone] [phone]) [phone]     ID: Simmie Garin DOB: [birth year]  MR#: [id number]  NOM#:[id number]  Patient Care Team: Lucianne Lei, MD as PCP - General (Family Medicine) Alphonsa Overall, MD as Consulting Physician (General Surgery) Kyndal Gloster, Virgie Dad, MD as Consulting Physician (Oncology) Eppie Gibson, MD as Attending Physician (Radiation Oncology) Juanita Craver, MD as Consulting Physician (Gastroenterology) Larey Dresser, MD as Consulting Physician (Cardiology) Irene Limbo, MD as Consulting Physician (Plastic Surgery) OTHER MD:  CHIEF COMPLAINT: triple positive bilateral breast cancer  CURRENT TREATMENT:  Trastuzumab; tamoxifen; zoledronate   INTERVAL HISTORY: Megahn returns today for follow-up and treatment of her triple positive bilateral breast cancer. She is accompanied by her husband.  She continues on trastuzumab.  Tolerates this with no side effects that she is aware of.  She also continues on tamoxifen. She is having hot flashes, but not often. She does not have vaginal wetness.  She has not been consistent with taking her pills.  It is unclear whether she even takes it every other day or even twice a week at this point.  She will start zolendronate infusion today, to be repeated every 12 weeks.  She was supposed to have started this on 05/24/2018 but she understood it was a "shot "and "I am not getting any shots "  Victora's last echocardiogram on 04/08/2018, showed an ejection fraction in the 60% - 65% range.  Echocardiogram today results are pending.   Since her last visit here, she completed postmastectomy radiation 05/03/18 - 06/06/18  Site/dose:     1. Left Chest Wall / 50 Gy delivered in 25 fractions of 2 Gy, with 1 cm bolus QOD  2. Left Supraclavicular, SCV nodes / 50 Gy delivered in 25 fractions of 2 Gy  Results for BONI, MACLELLAN "VICTORIA" (MRN [id number]) as of 75/24/2020 11:30  Ref. Range  03/01/2018 08:02 03/18/2018 08:02 04/08/2018 07:55 05/03/2018 10:11 05/24/2018 09:01  CA 27.29 Latest Ref Range: 0.0 - 38.6 U/mL 18.4 18.8 15.0 18.4 19.2     REVIEW OF SYSTEMS: [name] has some skin changes from her radiation. She has an appointment with the wound clinic. She didn't feel like her normal energetic self, but she wasn't tired. She said she is having "phantom breast itching." She is having some mild pain. Her hair is doing well and it's growing. Her eyesight has changed some; she states, "I've noticed that I'm not seeing as well as I was last year. I get my 8's and 5's confused."  The patient denies unusual headaches, nausea, vomiting, or dizziness. There has been no unusual cough, phlegm production, or pleurisy. This been no change in bowel or bladder habits. The patient denies unexplained fatigue or unexplained weight loss, bleeding, rash, or fever. A detailed review of systems was otherwise noncontributory.    HISTORY OF CURRENT ILLNESS: From the original intake note:  "[name]" tells me she underwent left lumpectomy in [place] on 2012 for a 2.5 cm, grade 2 breast cancer involving one lymph node of 5 sampled. [ It may have been only 2 lymph nodes that were removed she says.]  This was at the [facility] and [facility], currently the [place] [facility] on [address].  The patient says she was told could have more treatment if she wanted it.  She did not see any sense in it.  She feels her left breast cancer was caused by the fact that she kept her iPhone in her bra  right by the left breast.  Sometime in April 2018 she noted a new mass in the left breast. She did not immediately bring it to medical attention but as it continued to grow she mentioned her to her primary care physician and on 03/29/2017 [name] underwent bilateral diagnostic mammography with tomography and left breast ultrasonography at [facility].  The breast density was category B.  In the  left breast central to the nipple there was a 6 cm irregular mass with indistinct margins and heterogeneous calcifications.  By ultrasound this measured 5 cm, with indistinct margins, at the 12:00 anterior area.  There was associated edema.  The left axilla was sonographically benign.  Biopsy of the left breast area in question March 30, 2017 showed ([id number]) invasive ductal carcinoma, grade 3, estrogen receptor 100% positive, progesterone receptor 60% positive, both with strong staining intensity, HER-2 amplified, with a signals ratio of 3.38, and the number per cell 7.95.  The  MIB-1 was 70%.  The patient's subsequent history is as detailed below.    PAST MEDICAL HISTORY: Past Medical History:  Diagnosis Date  . Angioedema   . Anxiety   . Breast cancer ([place]) 2013  . Complication of anesthesia    slow to wake up   . History of radiation therapy 05/03/18- 06/06/18   Left chest wall/ 50 Gy delivered in 25 fractions of 2 Gy, with 1 cm bolus QOD. Left supraclavicular, SCV nodes/ 50 Gy delivered in 25 fractions of 2 Gy.   . Pneumonia    hx of x 2   . recurrent left breast ca dx'd 04/2017  . SBO (small bowel obstruction) ([place]) 08/13/2014    PAST SURGICAL HISTORY: Past Surgical History:  Procedure Laterality Date  . ABDOMINAL HYSTERECTOMY    . APPENDECTOMY    . APPLICATION OF WOUND VAC Left 03/21/2018   Procedure: APPLICATION OF WOUND VAC;  Surgeon: Irene Limbo, MD;  Location: [place];  Service: Plastics;  Laterality: Left;  . BREAST LUMPECTOMY  2013  . BREAST SURGERY    . CESAREAN SECTION     x2  . CHOLECYSTECTOMY    . CYST EXCISION     on abdomen  . MASTECTOMY WITH AXILLARY LYMPH NODE DISSECTION Left 03/21/2018   Procedure: LEFT MASTECTOMY WITH AXILLARY LYMPH NODE DISSECTION;  Surgeon: Alphonsa Overall, MD;  Location: [place];  Service: General;  Laterality: Left;  . PORTACATH PLACEMENT Right 05/17/2017   Procedure: INSERTION PORT-A-CATH right subclavian vein;  Surgeon:  Alphonsa Overall, MD;  Location: [facility];  Service: General;  Laterality: Right;  . TRAM Left 03/21/2018   Procedure: ADJACENT TISSUE TRANSFER LEFT CHEST AND ABDOMEN TO LEFT CHEST;  Surgeon: Irene Limbo, MD;  Location: [place];  Service: Plastics;  Laterality: Left;    FAMILY HISTORY Family History  Problem Relation Age of Onset  . Angioedema Mother   . Breast cancer Mother   . Breast cancer Maternal Aunt    The patient was adopted.  Her adoptive parents died in their 9s.  The patient's birth mother however died in her 42s from breast cancer.  The patient believes she has 2 brothers is not sure about sisters.  However she does have 2 maternal aunts who had breast cancer.  There is no history of ovarian cancer in the family to her knowledge.  The patient has no information regarding her father or his side of the family.   GYNECOLOGIC HISTORY:  No LMP recorded. Patient has had a hysterectomy. Menarche age 38,  first live birth age 10, she is [place] P2.  She underwent hysterectomy at age 57.  She tells me they left a fourth of an ovary at that time.  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  She actually lives in [place], [place], and spends 2 weeks here every 2 months at her job.  When she is in town she actually lives with Dr. Criss Rosales. 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  She recently married [name] who is a Camera operator.  He has 2 children of his own, in [place] and [place].  The patient's own children are [name] who lives in [place] and works in Engineer, technical sales, and [employer] lives in [place] and owns a [employer].  The patient has no grandchildren.  She is 1/7-day [facility].  (She tells me her religion has no bands on any medical treatments but she cannot have alcohol,  cigarettes, or pork).   ADVANCED DIRECTIVES:    HEALTH MAINTENANCE: Social History   Tobacco Use  . Smoking status: Never Smoker  . Smokeless tobacco: Never Used  Substance Use Topics  . Alcohol use: No  . Drug use: No     Colonoscopy:  PAP:  Bone density:   Allergies  Allergen Reactions  . Onion Anaphylaxis, Nausea And Vomiting and Swelling    Throat swelling.  . Other Anaphylaxis and Nausea And Vomiting    Green Peppers-anaphylactic, nausea/vomiting/swelling  Mushrooms-nausea/vomiting.  Patient has hereditary angioedema (HAE)  . Penicillins Rash and Other (See Comments)    Vomiting; has tolerated oral cephalosporins > intolerance PATIENT HAS HAD A PCN REACTION WITH IMMEDIATE RASH, FACIAL/TONGUE/THROAT SWELLING, SOB, OR LIGHTHEADEDNESS WITH HYPOTENSION:  #  #  YES  #  #  HAS PT DEVELOPED SEVERE RASH INVOLVING MUCUS MEMBRANES or SKIN NECROSIS: #  #  YES  #  #  Has patient had a PCN rxn that required hospitalization:Was inpatient when reaction occurred. Has patient had a PCN reaction occurring within the last 10 years: No   . Adhesive [Tape]     Bruises skin   . Aleve [Naproxen] Other (See Comments)    bleeding  . Anastrozole     Talking out of my head, hot flashes, body felt weird   . Codeine Nausea And Vomiting  . Morphine And Related Nausea And Vomiting  . Neulasta [Pegfilgrastim]     Muscle and bone pain    Current Outpatient Medications  Medication Sig Dispense Refill  . Ascorbic Acid (VITAMIN C PO) Take 1 tablet by mouth daily as needed (immune support).     . B Complex-C (B-COMPLEX WITH VITAMIN C) tablet Take 1 tablet by mouth daily as needed (immune support).     . cholecalciferol (VITAMIN D) 1000 units tablet Take 1,000 Units by mouth at bedtime.    . COD LIVER OIL PO Take 1 capsule by mouth daily as needed (immune support).     Marland Kitchen lidocaine-prilocaine (EMLA) cream APP EXT AA 1 TIME    . LORazepam (ATIVAN) 0.5 MG tablet Take 1 tablet (0.5 mg total) by  mouth at bedtime as needed for anxiety. (Patient not taking: Reported on 04/05/2018) 20 tablet 0  . Multiple Vitamin (MULTIVITAMIN WITH MINERALS) TABS tablet Take 1 tablet by mouth at bedtime.     . tamoxifen (  NOLVADEX) 20 MG tablet Take 20 mg by mouth daily.    Marland Kitchen triamterene-hydrochlorothiazide (DYAZIDE) 37.5-25 MG capsule Take 1 each (1 capsule total) by mouth daily. (Patient not taking: Reported on 04/05/2018) 30 capsule 0   No current facility-administered medications for this visit.     OBJECTIVE: African-American woman in no acute distress  Vitals:   07/08/18 1128  BP: 139/67  Pulse: 85  Resp: 14  Temp: 98.5 F (36.9 C)  SpO2: 100%     Body mass index is 35.98 kg/m.   Wt Readings from Last 3 Encounters:  07/08/18 226 lb 4.8 oz (102.6 kg)  07/08/18 226 lb 6.4 oz (102.7 kg)  05/03/18 226 lb 4 oz (102.6 kg)  ECOG FS:1 - Symptomatic but completely ambulatory   Sclerae unicteric, pupils round and equal Dentition in good repair No cervical or supraclavicular adenopathy Lungs no rales or rhonchi Heart regular rate and rhythm Abd soft, nontender, positive bowel sounds MSK no focal spinal tenderness, no upper extremity lymphedema Neuro: nonfocal, well oriented, appropriate affect Breasts: The right breast is benign.  The left breast has undergone mastectomy and radiation.  There is no evidence of residual or recurrent disease.  In the medial aspect of the incision, halfway up the chest wall, there is a shallow area of the dehiscence measuring 2 x 1 cm.  Both axillae are benign.  Breast on 11/01/2017    Left breast 03/18/2018     Left chest wall, 04/08/18      LAB RESULTS:  CMP     Component Value Date/Time   NA 143 07/08/2018 0956   NA 142 05/12/2017 0808   K 3.5 07/08/2018 0956   K 3.5 05/12/2017 0808   CL 105 07/08/2018 0956   CO2 29 07/08/2018 0956   CO2 26 05/12/2017 0808   GLUCOSE 125 (H) 07/08/2018 0956   GLUCOSE 133 05/12/2017 0808   BUN 10  07/08/2018 0956   BUN 9.8 05/12/2017 0808   CREATININE 0.76 07/08/2018 0956   CREATININE 0.9 05/12/2017 0808   CALCIUM 9.4 07/08/2018 0956   CALCIUM 9.3 05/12/2017 0808   PROT 7.5 07/08/2018 0956   PROT 7.3 05/12/2017 0808   ALBUMIN 3.1 (L) 07/08/2018 0956   ALBUMIN 3.3 (L) 05/12/2017 0808   AST 21 07/08/2018 0956   AST 15 05/12/2017 0808   ALT 9 07/08/2018 0956   ALT 10 05/12/2017 0808   ALKPHOS 132 (H) 07/08/2018 0956   ALKPHOS 90 05/12/2017 0808   BILITOT 0.4 07/08/2018 0956   BILITOT 0.56 05/12/2017 0808   GFRNONAA >60 07/08/2018 0956   GFRAA >60 07/08/2018 0956    No results found for: Ronnald Ramp, A1GS, A2GS, BETS, BETA2SER, GAMS, MSPIKE, SPEI  No results found for: Nils Pyle, [facility]  Lab Results  Component Value Date   WBC 4.1 07/08/2018   NEUTROABS 2.6 07/08/2018   HGB 11.6 (L) 07/08/2018   HCT 36.4 07/08/2018   MCV 92.9 07/08/2018   PLT 252 07/08/2018      Chemistry      Component Value Date/Time   NA 143 07/08/2018 0956   NA 142 05/12/2017 0808   K 3.5 07/08/2018 0956   K 3.5 05/12/2017 0808   CL 105 07/08/2018 0956   CO2 29 07/08/2018 0956   CO2 26 05/12/2017 0808   BUN 10 07/08/2018 0956   BUN 9.8 05/12/2017 0808   CREATININE 0.76 07/08/2018 0956   CREATININE 0.9 05/12/2017 0808      Component Value Date/Time  CALCIUM 9.4 07/08/2018 0956   CALCIUM 9.3 05/12/2017 0808   ALKPHOS 132 (H) 07/08/2018 0956   ALKPHOS 90 05/12/2017 0808   AST 21 07/08/2018 0956   AST 15 05/12/2017 0808   ALT 9 07/08/2018 0956   ALT 10 05/12/2017 0808   BILITOT 0.4 07/08/2018 0956   BILITOT 0.56 05/12/2017 0808       No results found for: LABCA2  No components found for: [id number]  No results for input(s): INR in the last 168 hours.  No results found for: LABCA2  No results found for: CAN199  No results found for: TXH741  No results found for: SEL953  Lab Results  Component Value Date   CA2729 19.2 05/24/2018     No components found for: HGQUANT  No results found for: CEA1 / No results found for: CEA1   No results found for: AFPTUMOR  No results found for: [place]  No results found for: PSA1  Appointment on 07/08/2018  Component Date Value Ref Range Status  . Sodium 07/08/2018 143  135 - 145 mmol/L Final  . Potassium 07/08/2018 3.5  3.5 - 5.1 mmol/L Final  . Chloride 07/08/2018 105  98 - 111 mmol/L Final  . CO2 07/08/2018 29  22 - 32 mmol/L Final  . Glucose, Bld 07/08/2018 125* 70 - 99 mg/dL Final  . BUN 07/08/2018 10  8 - 23 mg/dL Final  . Creatinine 07/08/2018 0.76  0.44 - 1.00 mg/dL Final  . Calcium 07/08/2018 9.4  8.9 - 10.3 mg/dL Final  . Total Protein 07/08/2018 7.5  6.5 - 8.1 g/dL Final  . Albumin 07/08/2018 3.1* 3.5 - 5.0 g/dL Final  . AST 07/08/2018 21  15 - 41 U/L Final  . ALT 07/08/2018 9  0 - 44 U/L Final  . Alkaline Phosphatase 07/08/2018 132* 38 - 126 U/L Final  . Total Bilirubin 07/08/2018 0.4  0.3 - 1.2 mg/dL Final  . GFR, Est Non Af Am 07/08/2018 >60  >60 mL/min Final  . GFR, Est AFR Am 07/08/2018 >60  >60 mL/min Final  . Anion gap 07/08/2018 9  5 - 15 Final   Performed at [facility], [place] [address], [place], [place] [address]  . WBC Count 07/08/2018 4.1  4.0 - 10.5 K/uL Final  . RBC 07/08/2018 3.92  3.87 - 5.11 MIL/uL Final  . Hemoglobin 07/08/2018 11.6* 12.0 - 15.0 g/dL Final  . HCT 07/08/2018 36.4  36.0 - 46.0 % Final  . MCV 07/08/2018 92.9  80.0 - 100.0 fL Final  . MCH 07/08/2018 29.6  26.0 - 34.0 pg Final  . MCHC 07/08/2018 31.9  30.0 - 36.0 g/dL Final  . RDW 07/08/2018 13.7  11.5 - 15.5 % Final  . Platelet Count 07/08/2018 252  150 - 400 K/uL Final  . nRBC 07/08/2018 0.0  0.0 - 0.2 % Final  . Neutrophils Relative % 07/08/2018 63  % Final  . Neutro Abs 07/08/2018 2.6  1.7 - 7.7 K/uL Final  . Lymphocytes Relative 07/08/2018 24  % Final  . Lymphs Abs 07/08/2018 1.0  0.7 - 4.0 K/uL Final  . Monocytes Relative 07/08/2018 7  %  Final  . Monocytes Absolute 07/08/2018 0.3  0.1 - 1.0 K/uL Final  . Eosinophils Relative 07/08/2018 4  % Final  . Eosinophils Absolute 07/08/2018 0.2  0.0 - 0.5 K/uL Final  . Basophils Relative 07/08/2018 1  % Final  . Basophils Absolute 07/08/2018 0.0  0.0 - 0.1 K/uL Final  .  Immature Granulocytes 07/08/2018 1  % Final  . Abs Immature Granulocytes 07/08/2018 0.02  0.00 - 0.07 K/uL Final   Performed at [facility], [place] [address] [place], [place] [address]    (this displays the last labs from the last 3 days)  No results found for: TOTALPROTELP, ALBUMINELP, A1GS, A2GS, BETS, BETA2SER, GAMS, MSPIKE, SPEI (this displays SPEP labs)  No results found for: KPAFRELGTCHN, LAMBDASER, KAPLAMBRATIO (kappa/lambda light chains)  No results found for: HGBA, HGBA2QUANT, HGBFQUANT, HGBSQUAN (Hemoglobinopathy evaluation)    No results found for: LDH  No results found for: IRON, TIBC, IRONPCTSAT (Iron and TIBC)  No results found for: FERRITIN  Urinalysis    Component Value Date/Time   COLORURINE YELLOW 08/10/2017 [place] 08/10/2017 0924   LABSPEC 1.010 08/10/2017 0924   PHURINE 6.5 08/10/2017 [place] 08/10/2017 0924   HGBUR NEGATIVE 08/10/2017 0924   BILIRUBINUR NEGATIVE 08/10/2017 0924   KETONESUR NEGATIVE 08/10/2017 0924   PROTEINUR NEGATIVE 08/10/2017 0924   NITRITE NEGATIVE 08/10/2017 0924   LEUKOCYTESUR SMALL (A) 08/10/2017 0924     STUDIES: Echocardiogram done today, with results pending.   ELIGIBLE FOR AVAILABLE RESEARCH PROTOCOL:no   ASSESSMENT: 73 y.o. [place] woman (primarily residing in [place])  (1) status post left lumpectomy in 2012 for a reported pT2 pN1 breast cancer, the patient refusing adjuvant treatment (no chemotherapy, radiation, or antiestrogens).   METASTATIC DISEASE: December 2018 (bilateral triple positive disease) (2) status post left breast biopsy 03/30/2017 for a clinical T4 N0, stage  IIIB invasive ductal carcinoma, grade 3, triple positive, with an MIB-1 of 70%  (a) staging studies 05/21/2017 show multiple bilateral pulmonary nodules consistent with stage IV disease 05/21/2017; there are no liver or bone lesions noted  (b) breast MRI 05/13/2017 shows a 7 cm area of non-masslike enhancement in the right breast, with biopsy ([id number]) on 06/24/2017 showing: Metastatic carcinoma. Prognostic panel was strongly ER and PR positive as well as HER-2 positive with the signals ratio of 2.61 and number per cell 5.35 (triple positive)  (c) left breast skin biopsy 02/02/2018 confirms triple positive breast cancer  (3) neoadjuvant chemotherapy with T-DM1 started 06/29/2016  (a) echocardiogram 06/24/2017 showed an ejection fraction of 60-65% range  (b) echocardiogram 09/23/2017 showed an ejection fraction of 65-70% range  (c) CT chest on 10/12/2017 shows progression in nodes, pulmonary nodules, and bone mets   (4) status post left mastectomy with axillary lymph node dissection 03/21/2018 for a pT3 pN2 invasive ductal carcinoma, grade 3, involving pectoralis muscle as well as the nipple and epidermis  (5) anastrozole written on 09/21/2017, started 02/13/2018, discontinued after 2 weeks with visual changes  (a) patient notes inability to tolerate tamoxifen in the past (nausea).  (6) genetics testing pending  (7) chemotherapy with carboplatin, gemcitabine, and trastuzumab 11/01/2017-01/18/2018, with Gemcitabine and Carboplatin given on day 1, and Gemcitabine, Carboplatin, and Trastuzumab given on day 8,repeated every 21 days x 4, last dose 01/18/2018  (a) carboplatin omitted from cycle 4 because of neuropathy  (8) trastuzumab to be continued indefinitely  (a) echocardiogram 01/11/2018 showed an ejection fraction in the 65-70% range  (b) echocardiogram 04/08/2018 shows ejection fraction in the 60-65% range.  (c) echocardiogram 07/08/2018 results pending  (d) switched to every 4 weeks after  the 07/08/2018 dose  (9) postmastectomy radiation 05/03/18 - 06/06/18  Site/dose:     1. Left Chest Wall / 50 Gy delivered in 25 fractions of 2 Gy, with 1 cm bolus QOD  2. Left Supraclavicular, SCV nodes / 50 Gy delivered in 25 fractions of 2 Gy  (10) zolendronate to be repeated every 12 weeks, first dose 07/08/2018  (11) tamoxifen started 04/08/2018  (a) patient taking it with poor compliance   PLAN: [name] has completed local treatment for her breast cancer.  She is tolerating the trastuzumab well.  She strongly presses me to move the trastuzumab to every 4 weeks because of transportation issues and this is not unreasonable.  She is not taking the tamoxifen as prescribed.  It is difficult to pin her down on just how often she is taking it.  I offered her again to switch to fulvestrant which can be done every 4 weeks when she comes for her trastuzumab but "I do not want any shots".  If she were to stay with her treatments here I would set her up for scans next month.  However they are down to 1 car that has more than 200,000 miles on it and they really need to stop driving such long distances.  She is planning to move her care to [place].  We will be glad to facilitate this in any way that we can.  I am setting her up for a repeat visit and treatment here in 4 weeks but if she has established herself with an oncologist closer to home then she does not have to keep that appointment, of course.  [name] knows I will always be glad to see her and always be glad to do what ever I can on her behalf.   Crissa Sowder, Virgie Dad, MD  07/08/18 12:04 PM Medical Oncology and Hematology [facility] [address] [place], [place] [address] Tel. [phone]    Fax. [phone]  I, Jacqualyn Posey am acting as a Education administrator for Chauncey Cruel, MD.   I, Lurline Del MD, have reviewed the above documentation for accuracy and completeness, and I agree with the above.

## 2018-07-08 ENCOUNTER — Inpatient Hospital Stay (HOSPITAL_BASED_OUTPATIENT_CLINIC_OR_DEPARTMENT_OTHER): Payer: Medicare Other | Admitting: Oncology

## 2018-07-08 ENCOUNTER — Telehealth: Payer: Self-pay | Admitting: Oncology

## 2018-07-08 ENCOUNTER — Inpatient Hospital Stay: Payer: Medicare Other

## 2018-07-08 ENCOUNTER — Encounter: Payer: Self-pay | Admitting: Radiation Oncology

## 2018-07-08 ENCOUNTER — Other Ambulatory Visit: Payer: Self-pay | Admitting: Radiation Oncology

## 2018-07-08 ENCOUNTER — Ambulatory Visit (HOSPITAL_COMMUNITY): Payer: Medicare Other | Attending: Cardiovascular Disease

## 2018-07-08 ENCOUNTER — Other Ambulatory Visit: Payer: Self-pay

## 2018-07-08 ENCOUNTER — Inpatient Hospital Stay: Payer: Medicare Other | Attending: Oncology

## 2018-07-08 ENCOUNTER — Ambulatory Visit
Admission: RE | Admit: 2018-07-08 | Discharge: 2018-07-08 | Disposition: A | Discharge status: Home / Self Care | Payer: Medicare Other | Source: Ambulatory Visit | Attending: Radiation Oncology | Admitting: Radiation Oncology

## 2018-07-08 VITALS — BP 134/61 | HR 90 | Temp 97.7°F | Resp 20 | Ht 66.5 in | Wt 226.4 lb

## 2018-07-08 VITALS — BP 139/67 | HR 85 | Temp 98.5°F | Resp 14 | Ht 66.5 in | Wt 226.3 lb

## 2018-07-08 DIAGNOSIS — C50011 Malignant neoplasm of nipple and areola, right female breast: Secondary | ICD-10-CM

## 2018-07-08 DIAGNOSIS — Z5112 Encounter for antineoplastic immunotherapy: Secondary | ICD-10-CM | POA: Diagnosis not present

## 2018-07-08 DIAGNOSIS — C50212 Malignant neoplasm of upper-inner quadrant of left female breast: Secondary | ICD-10-CM

## 2018-07-08 DIAGNOSIS — Z79811 Long term (current) use of aromatase inhibitors: Secondary | ICD-10-CM | POA: Insufficient documentation

## 2018-07-08 DIAGNOSIS — Z853 Personal history of malignant neoplasm of breast: Secondary | ICD-10-CM | POA: Diagnosis not present

## 2018-07-08 DIAGNOSIS — C7800 Secondary malignant neoplasm of unspecified lung: Secondary | ICD-10-CM | POA: Diagnosis not present

## 2018-07-08 DIAGNOSIS — Z803 Family history of malignant neoplasm of breast: Secondary | ICD-10-CM

## 2018-07-08 DIAGNOSIS — C50012 Malignant neoplasm of nipple and areola, left female breast: Secondary | ICD-10-CM

## 2018-07-08 DIAGNOSIS — N951 Menopausal and female climacteric states: Secondary | ICD-10-CM

## 2018-07-08 DIAGNOSIS — C7951 Secondary malignant neoplasm of bone: Secondary | ICD-10-CM

## 2018-07-08 DIAGNOSIS — Z170 Estrogen receptor positive status [ER+]: Secondary | ICD-10-CM

## 2018-07-08 DIAGNOSIS — Z923 Personal history of irradiation: Secondary | ICD-10-CM | POA: Insufficient documentation

## 2018-07-08 DIAGNOSIS — Z510 Encounter for antineoplastic radiation therapy: Secondary | ICD-10-CM | POA: Diagnosis not present

## 2018-07-08 DIAGNOSIS — Z95828 Presence of other vascular implants and grafts: Secondary | ICD-10-CM

## 2018-07-08 HISTORY — DX: Personal history of irradiation: Z92.3

## 2018-07-08 LAB — CMP (CANCER CENTER ONLY)
ALT: 9 U/L (ref 0–44)
AST: 21 U/L (ref 15–41)
Albumin: 3.1 g/dL — ABNORMAL LOW (ref 3.5–5.0)
Alkaline Phosphatase: 132 U/L — ABNORMAL HIGH (ref 38–126)
Anion gap: 9 (ref 5–15)
BUN: 10 mg/dL (ref 8–23)
CO2: 29 mmol/L (ref 22–32)
CREATININE: 0.76 mg/dL (ref 0.44–1.00)
Calcium: 9.4 mg/dL (ref 8.9–10.3)
Chloride: 105 mmol/L (ref 98–111)
GFR, Est AFR Am: >60 mL/min (ref >60)
GFR, Estimated: >60 mL/min (ref >60)
Glucose, Bld: 125 mg/dL — ABNORMAL HIGH (ref 70–99)
POTASSIUM: 3.5 mmol/L (ref 3.5–5.1)
Sodium: 143 mmol/L (ref 135–145)
Total Bilirubin: 0.4 mg/dL (ref 0.3–1.2)
Total Protein: 7.5 g/dL (ref 6.5–8.1)

## 2018-07-08 LAB — CBC WITH DIFFERENTIAL (CANCER CENTER ONLY)
Abs Immature Granulocytes: 0.02 10*3/uL (ref 0.00–0.07)
Basophils Absolute: 0 10*3/uL (ref 0.0–0.1)
Basophils Relative: 1 %
Eosinophils Absolute: 0.2 10*3/uL (ref 0.0–0.5)
Eosinophils Relative: 4 %
HCT: 36.4 % (ref 36.0–46.0)
Hemoglobin: 11.6 g/dL — ABNORMAL LOW (ref 12.0–15.0)
Immature Granulocytes: 1 %
Lymphocytes Relative: 24 %
Lymphs Abs: 1 10*3/uL (ref 0.7–4.0)
MCH: 29.6 pg (ref 26.0–34.0)
MCHC: 31.9 g/dL (ref 30.0–36.0)
MCV: 92.9 fL (ref 80.0–100.0)
MONO ABS: 0.3 10*3/uL (ref 0.1–1.0)
MONOS PCT: 7 %
Neutro Abs: 2.6 10*3/uL (ref 1.7–7.7)
Neutrophils Relative %: 63 %
Platelet Count: 252 10*3/uL (ref 150–400)
RBC: 3.92 MIL/uL (ref 3.87–5.11)
RDW: 13.7 % (ref 11.5–15.5)
WBC Count: 4.1 10*3/uL (ref 4.0–10.5)
nRBC: 0 % (ref 0.0–0.2)

## 2018-07-08 LAB — ECHOCARDIOGRAM COMPLETE
Height: 66.5 in
Weight: 3620.8 oz

## 2018-07-08 MED ORDER — DIPHENHYDRAMINE HCL 25 MG PO CAPS
ORAL_CAPSULE | ORAL | Status: AC
  Filled 2018-07-08: qty 1

## 2018-07-08 MED ORDER — ACETAMINOPHEN 325 MG PO TABS
ORAL_TABLET | ORAL | Status: AC
  Filled 2018-07-08: qty 2

## 2018-07-08 MED ORDER — SODIUM CHLORIDE 0.9% FLUSH
10.0000 mL | Freq: Once | INTRAVENOUS | Status: AC
  Administered 2018-07-08: 10 mL
  Filled 2018-07-08: qty 10

## 2018-07-08 MED ORDER — ACETAMINOPHEN 325 MG PO TABS
650.0000 mg | ORAL_TABLET | Freq: Once | ORAL | Status: AC
  Administered 2018-07-08: 650 mg via ORAL

## 2018-07-08 MED ORDER — TRASTUZUMAB CHEMO 150 MG IV SOLR
600.0000 mg | Freq: Once | INTRAVENOUS | Status: AC
  Administered 2018-07-08: 600 mg via INTRAVENOUS
  Filled 2018-07-08: qty 28.57

## 2018-07-08 MED ORDER — PERFLUTREN LIPID MICROSPHERE
1.0000 mL | INTRAVENOUS | Status: AC | PRN
  Administered 2018-07-08: 2 mL via INTRAVENOUS

## 2018-07-08 MED ORDER — ZOLEDRONIC ACID 4 MG/100ML IV SOLN
4.0000 mg | Freq: Once | INTRAVENOUS | Status: AC
  Administered 2018-07-08: 4 mg via INTRAVENOUS
  Filled 2018-07-08: qty 100

## 2018-07-08 MED ORDER — HEPARIN SOD (PORK) LOCK FLUSH 100 UNIT/ML IV SOLN
500.0000 [IU] | Freq: Once | INTRAVENOUS | Status: AC | PRN
  Administered 2018-07-08: 500 [IU]
  Filled 2018-07-08: qty 5

## 2018-07-08 MED ORDER — DIPHENHYDRAMINE HCL 25 MG PO CAPS
25.0000 mg | ORAL_CAPSULE | Freq: Once | ORAL | Status: AC
  Administered 2018-07-08: 25 mg via ORAL

## 2018-07-08 MED ORDER — SODIUM CHLORIDE 0.9 % IV SOLN
Freq: Once | INTRAVENOUS | Status: AC
  Administered 2018-07-08: 12:00:00 via INTRAVENOUS
  Filled 2018-07-08: qty 250

## 2018-07-08 MED ORDER — SODIUM CHLORIDE 0.9% FLUSH
10.0000 mL | INTRAVENOUS | Status: DC | PRN
  Administered 2018-07-08: 10 mL
  Filled 2018-07-08: qty 10

## 2018-07-08 NOTE — Progress Notes (Signed)
Patient OK to treat today, getting an ECHO today after infusion.

## 2018-07-08 NOTE — Progress Notes (Signed)
Radiation Oncology         [phone]) [phone] ________________________________  Name: Tracy Howell MRN: [id number]  Date: 07/08/2018  DOB: Jul 06, 1945  Follow-Up Visit Note  Outpatient  CC: Lucianne Lei, MD  Irene Limbo, MD  Diagnosis and Prior Radiotherapy:    ICD-10-CM   1. Malignant neoplasm of upper-inner quadrant of left breast in female, estrogen receptor positive ([place]) C50.212 AMB referral to wound care center   Z17.0     Metastatic Breast Cancer  (ypT4a, pN2a, cM1, G3, ER+, PR+, HER2+)   CHIEF COMPLAINT: Here for follow-up and surveillance of metastatic breast cancer  Radiation treatment dates:   05/03/18 - 06/06/18  Site/dose:    1. Left Chest Wall / 50 Gy delivered in 25 fractions of 2 Gy, with 1 cm bolus QOD 2. Left Supraclavicular, SCV nodes / 50 Gy delivered in 25 fractions of 2 Gy  Narrative:  The patient returns today for routine follow-up. She is accompanied by her spouse. She is applying neosporin ointment to her skin at this time. She is no longer using the cream that was given to her. She denies any new lumps or pain. There is an open area to her radiation site that was initially scabbed. She is not allergic to silvadene. She is taking tamoxifen and she is receiving herceptin infusion today as well. She has a follow up appointment with Dr. Jana Hakim today as well.                        ALLERGIES:  is allergic to onion; other; penicillins; adhesive [tape]; aleve [naproxen]; anastrozole; codeine; morphine and related; and neulasta [pegfilgrastim].  Meds: Current Outpatient Medications  Medication Sig Dispense Refill  . Ascorbic Acid (VITAMIN C PO) Take 1 tablet by mouth daily as needed (immune support).     . B Complex-C (B-COMPLEX WITH VITAMIN C) tablet Take 1 tablet by mouth daily as needed (immune support).     . cholecalciferol (VITAMIN D) 1000 units tablet Take 1,000 Units by mouth at bedtime.    . COD LIVER OIL PO Take 1 capsule by mouth daily  as needed (immune support).     Marland Kitchen lidocaine-prilocaine (EMLA) cream APP EXT AA 1 TIME    . Multiple Vitamin (MULTIVITAMIN WITH MINERALS) TABS tablet Take 1 tablet by mouth at bedtime.     . tamoxifen (NOLVADEX) 20 MG tablet Take 20 mg by mouth daily.    Marland Kitchen LORazepam (ATIVAN) 0.5 MG tablet Take 1 tablet (0.5 mg total) by mouth at bedtime as needed for anxiety. (Patient not taking: Reported on 04/05/2018) 20 tablet 0  . triamterene-hydrochlorothiazide (DYAZIDE) 37.5-25 MG capsule Take 1 each (1 capsule total) by mouth daily. (Patient not taking: Reported on 04/05/2018) 30 capsule 0   No current facility-administered medications for this encounter.    Facility-Administered Medications Ordered in Other Encounters  Medication Dose Route Frequency Provider Last Rate Last Dose  . perflutren lipid microspheres (DEFINITY) IV suspension  1-10 mL Intravenous PRN Magrinat, Virgie Dad, MD   2 mL at 07/08/18 1535  . sodium chloride flush (NS) 0.9 % injection 10 mL  10 mL Intracatheter PRN Magrinat, Virgie Dad, MD   10 mL at 07/08/18 1407    Physical Findings: The patient is in no acute distress. Patient is alert and oriented.  height is 5' 6.5" (1.689 m) and weight is 226 lb 6.4 oz (102.7 kg). Her oral temperature is 97.7 F (36.5 C). Her blood pressure is  134/61 and her pulse is 90. Her respiration is 20 and oxygen saturation is 100%. .      Continue dryness and hyperpigmentation over the left chest, where she was treated and dryness over the left upper back as well.  Open 2cm area along medial scar where previous scabbing had been - see photo   Lab Findings: Lab Results  Component Value Date   WBC 4.1 07/08/2018   HGB 11.6 (L) 07/08/2018   HCT 36.4 07/08/2018   MCV 92.9 07/08/2018   PLT 252 07/08/2018    Radiographic Findings: No results found.  Impression/Plan:  Continue skin care with topical Vitamin E Oil and / or lotion for at least 2 more months for further healing.  Persistent area that  is about 2 cm at the medial aspect of the left chest scar which had been covered by a scab since surgery and through radiation, but now that the scab has fallen off this area is open with moist tissue underneath. Advised the patient to use silvadene BID to the open area. Patient advised to wash off the old silvadene before re-application each time. Will have the patient follow up with the [facility]ound [facility] for more healing advice. Making referral now.  Stop using prosethesis - patient insert this in her bra and reports it irritates/rubs that area.  I will see her back in March.    Eppie Gibson, MD  This document serves as a record of services personally performed by Eppie Gibson, MD. It was created on her behalf by Steva Colder, a trained medical scribe. The creation of this record is based on the scribe's personal observations and the provider's statements to them. This document has been checked and approved by the attending provider.

## 2018-07-08 NOTE — Patient Instructions (Signed)
[facility] Discharge Instructions for Patients Receiving Chemotherapy  Today you received the following chemotherapy agents Trastuzumab (Herceptin); Zometa  To help prevent nausea and vomiting after your treatment, we encourage you to take your nausea medication as prescribed.  If you develop nausea and vomiting that is not controlled by your nausea medication, call the clinic.   BELOW ARE SYMPTOMS THAT SHOULD BE REPORTED IMMEDIATELY:  *FEVER GREATER THAN 100.5 F  *CHILLS WITH OR WITHOUT FEVER  NAUSEA AND VOMITING THAT IS NOT CONTROLLED WITH YOUR NAUSEA MEDICATION  *UNUSUAL SHORTNESS OF BREATH  *UNUSUAL BRUISING OR BLEEDING  TENDERNESS IN MOUTH AND THROAT WITH OR WITHOUT PRESENCE OF ULCERS  *URINARY PROBLEMS  *BOWEL PROBLEMS  UNUSUAL RASH Items with * indicate a potential emergency and should be followed up as soon as possible.  Feel free to call the clinic should you have any questions or concerns. The clinic phone number is [phone].  Please show the [facility] at check-in to the Emergency Department and triage nurse.  Zoledronic Acid injection (Hypercalcemia, Oncology) What is this medicine? ZOLEDRONIC ACID (ZOE le dron ik AS id) lowers the amount of calcium loss from bone. It is used to treat too much calcium in your blood from cancer. It is also used to prevent complications of cancer that has spread to the bone. This medicine may be used for other purposes; ask your health care provider or pharmacist if you have questions. COMMON BRAND NAME(S): Zometa What should I tell my health care provider before I take this medicine? They need to know if you have any of these conditions: -aspirin-sensitive asthma -cancer, especially if you are receiving medicines used to treat cancer -dental disease or wear dentures -infection -kidney disease -receiving corticosteroids like dexamethasone or prednisone -an unusual or allergic reaction to zoledronic  acid, other medicines, foods, dyes, or preservatives -pregnant or trying to get pregnant -breast-feeding How should I use this medicine? This medicine is for infusion into a vein. It is given by a health care professional in a hospital or clinic setting. Talk to your pediatrician regarding the use of this medicine in children. Special care may be needed. Overdosage: If you think you have taken too much of this medicine contact a poison control center or emergency room at once. NOTE: This medicine is only for you. Do not share this medicine with others. What if I miss a dose? It is important not to miss your dose. Call your doctor or health care professional if you are unable to keep an appointment. What may interact with this medicine? -certain antibiotics given by injection -NSAIDs, medicines for pain and inflammation, like ibuprofen or naproxen -some diuretics like bumetanide, furosemide -teriparatide -thalidomide This list may not describe all possible interactions. Give your health care provider a list of all the medicines, herbs, non-prescription drugs, or dietary supplements you use. Also tell them if you smoke, drink alcohol, or use illegal drugs. Some items may interact with your medicine. What should I watch for while using this medicine? Visit your doctor or health care professional for regular checkups. It may be some time before you see the benefit from this medicine. Do not stop taking your medicine unless your doctor tells you to. Your doctor may order blood tests or other tests to see how you are doing. Women should inform their doctor if they wish to become pregnant or think they might be pregnant. There is a potential for serious side effects to an unborn child. Talk  to your health care professional or pharmacist for more information. You should make sure that you get enough calcium and vitamin D while you are taking this medicine. Discuss the foods you eat and the vitamins you  take with your health care professional. Some people who take this medicine have severe bone, joint, and/or muscle pain. This medicine may also increase your risk for jaw problems or a broken thigh bone. Tell your doctor right away if you have severe pain in your jaw, bones, joints, or muscles. Tell your doctor if you have any pain that does not go away or that gets worse. Tell your dentist and dental surgeon that you are taking this medicine. You should not have major dental surgery while on this medicine. See your dentist to have a dental exam and fix any dental problems before starting this medicine. Take good care of your teeth while on this medicine. Make sure you see your dentist for regular follow-up appointments. What side effects may I notice from receiving this medicine? Side effects that you should report to your doctor or health care professional as soon as possible: -allergic reactions like skin rash, itching or hives, swelling of the face, lips, or tongue -anxiety, confusion, or depression -breathing problems -changes in vision -eye pain -feeling faint or lightheaded, falls -jaw pain, especially after dental work -mouth sores -muscle cramps, stiffness, or weakness -redness, blistering, peeling or loosening of the skin, including inside the mouth -trouble passing urine or change in the amount of urine Side effects that usually do not require medical attention (report to your doctor or health care professional if they continue or are bothersome): -bone, joint, or muscle pain -constipation -diarrhea -fever -hair loss -irritation at site where injected -loss of appetite -nausea, vomiting -stomach upset -trouble sleeping -trouble swallowing -weak or tired This list may not describe all possible side effects. Call your doctor for medical advice about side effects. You may report side effects to FDA at 1-800-FDA-1088. Where should I keep my medicine? This drug is given in a  hospital or clinic and will not be stored at home. NOTE: This sheet is a summary. It may not cover all possible information. If you have questions about this medicine, talk to your doctor, pharmacist, or health care provider.  2019 Elsevier/Gold Standard (2013-10-28 14:19:39)

## 2018-07-08 NOTE — Telephone Encounter (Signed)
Gave avs and calendar ° °

## 2018-07-08 NOTE — Progress Notes (Signed)
Zometa consent obtained 07/08/2018.

## 2018-07-08 NOTE — Patient Instructions (Signed)

## 2018-07-09 LAB — CANCER ANTIGEN 27.29: CA 27.29: 19.4 U/mL (ref 0.0–38.6)

## 2018-07-11 ENCOUNTER — Telehealth: Payer: Self-pay | Admitting: *Deleted

## 2018-07-11 NOTE — Telephone Encounter (Signed)
CALLED PATIENT TO ASK IF SHE COULD DO A 07/18/18 APPT. FOR WOUND CARE, PT. DECLINED TO TAKE THIS APPT. SHE SAID THAT SHE WOULD CALL ME BACK WHEN SHE CAN DO THIS APPT., [place]

## 2018-07-13 ENCOUNTER — Other Ambulatory Visit: Payer: Self-pay

## 2018-07-13 ENCOUNTER — Telehealth: Payer: Self-pay

## 2018-07-13 DIAGNOSIS — Z510 Encounter for antineoplastic radiation therapy: Secondary | ICD-10-CM | POA: Diagnosis not present

## 2018-07-13 DIAGNOSIS — C50212 Malignant neoplasm of upper-inner quadrant of left female breast: Secondary | ICD-10-CM | POA: Diagnosis not present

## 2018-07-13 DIAGNOSIS — Z170 Estrogen receptor positive status [ER+]: Secondary | ICD-10-CM | POA: Diagnosis not present

## 2018-07-13 MED ORDER — SILVER SULFADIAZINE 1 % EX CREA
TOPICAL_CREAM | Freq: Two times a day (BID) | CUTANEOUS | Status: AC
  Administered 2018-07-13: 09:00:00 via TOPICAL

## 2018-07-13 NOTE — Telephone Encounter (Signed)
Patient called to update on symptoms after receiving Zometa on 07/08/2018.  Patient was unable to move for several days after infusion, severe bone pain, no appetite, diarrhea and increase in urine output per patient.    Patient reports improvement with discomfort and mobility.  Nurse encouraged OTC Tylenol for pain relief.  Pt is now able to tolerate foods.  Will inform MD.  Follow up apt is scheduled prior to next infusion, patient aware and voiced understanding.

## 2018-07-15 ENCOUNTER — Encounter: Payer: Self-pay | Admitting: Radiation Oncology

## 2018-08-05 ENCOUNTER — Other Ambulatory Visit: Payer: Medicare Other

## 2018-08-05 ENCOUNTER — Ambulatory Visit: Payer: Medicare Other | Admitting: Oncology

## 2018-08-05 ENCOUNTER — Ambulatory Visit: Payer: Medicare Other

## 2018-08-08 NOTE — Addendum Note (Signed)
Addended by: Manus Gunning L on: 08/08/2018 03:24 PM   Modules accepted: Orders

## 2018-08-24 ENCOUNTER — Telehealth: Payer: Self-pay | Admitting: Radiation Oncology

## 2018-08-24 NOTE — Telephone Encounter (Signed)
New message:    Pt called and canceled appt and states she will call back to reschedule

## 2018-09-06 ENCOUNTER — Ambulatory Visit: Payer: Self-pay | Admitting: Radiation Oncology

## 2018-11-17 IMAGING — MR MR BILATERAL BREAST WITHOUT AND WITH CONTRAST
8 of 12 series · 31 of 48 positions shown · IV contrast (20ml Multihance)
Comparison: Prior mammograms and ultrasounds from [facility], with the
most recent dated [DATE] - 03/31/2017.

CLINICAL DATA: 71-year-old female with newly diagnosed invasive
left breast cancer. History of left breast cancer with 2 positive
lymph nodes and lumpectomy in 7257.

LABS:  None performed today.
EXAM:
BILATERAL BREAST MRI WITH AND WITHOUT CONTRAST
TECHNIQUE: Multiplanar, multisequence MR images of both breasts were obtained
prior to and following the intravenous administration of 20 ml of
MultiHance.

[Series 2: t2_tirm_tra ipat (a-p) · axial · 3.0mm · 0.74mm/px · 1 of 57 slices shown]
[im 1/57]
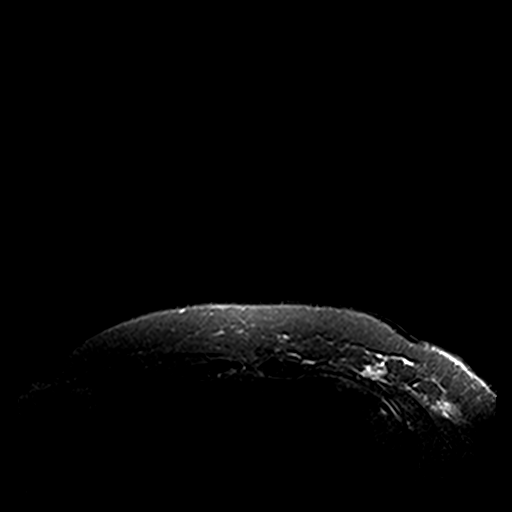

[Series 3: fl3d pre-cm no · axial · non-contrast · 1.2mm · 1.04mm/px · z∈[-51,+120]mm · 5 of 144 slices shown]
[im 1/144]
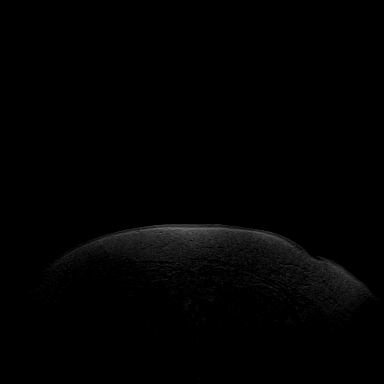
[im 36/144]
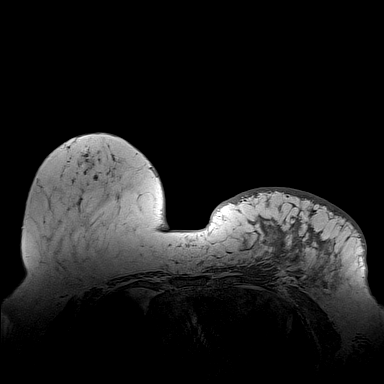
[im 72/144]
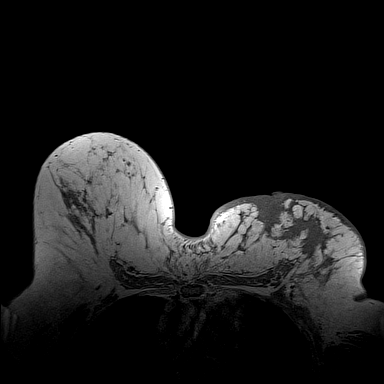
[im 108/144]
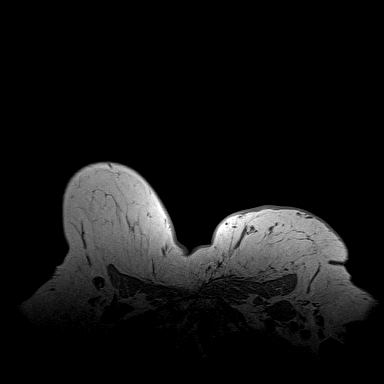
[im 144/144]
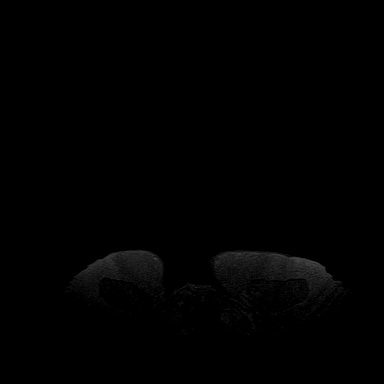

[Series 4: fl3d pre-cm · axial · non-contrast · 1.2mm · 1.04mm/px · z∈[-51,+120]mm · 5 of 144 slices shown]
[im 1/144]
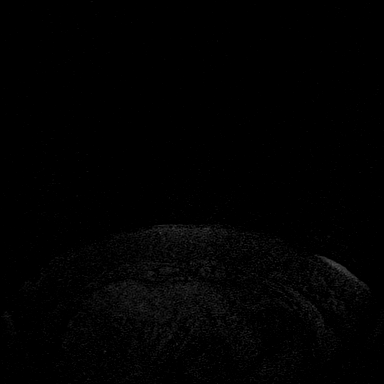
[im 36/144]
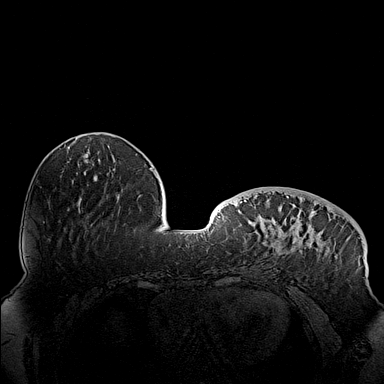
[im 72/144]
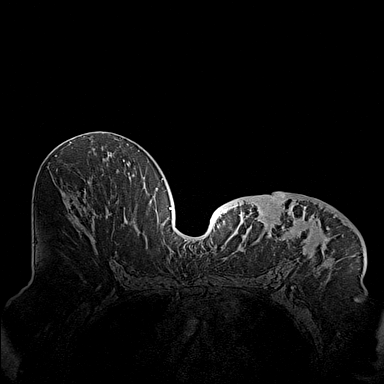
[im 108/144]
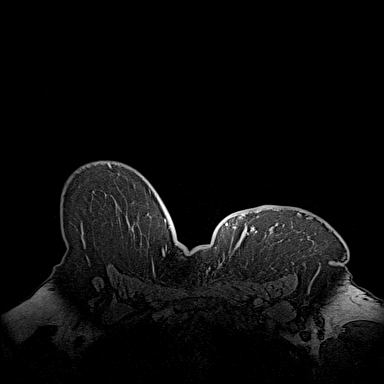
[im 144/144]
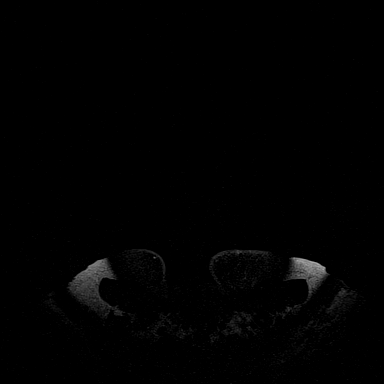

[Series 5: fl3d post-cm 20 · axial · 1.2mm · 1.04mm/px · z∈[-51,+120]mm · 5 of 144 slices shown (1 of 3)]
[im 1/144]
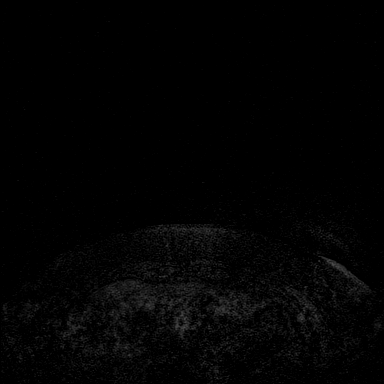
[im 36/144]
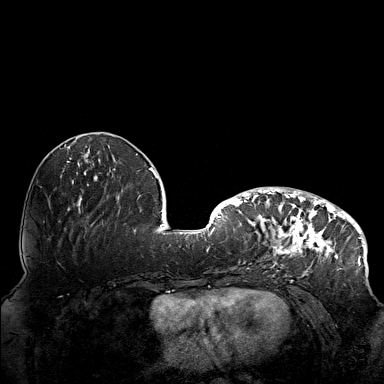
[im 72/144]
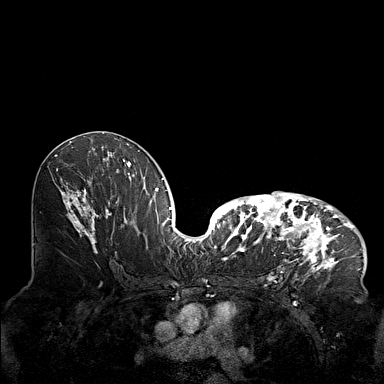
[im 108/144]
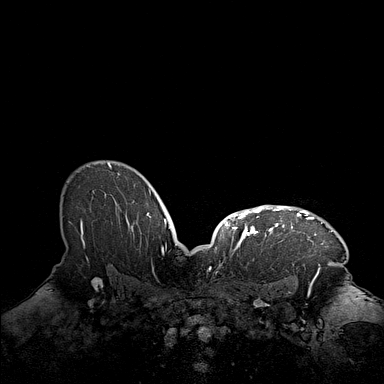
[im 144/144]
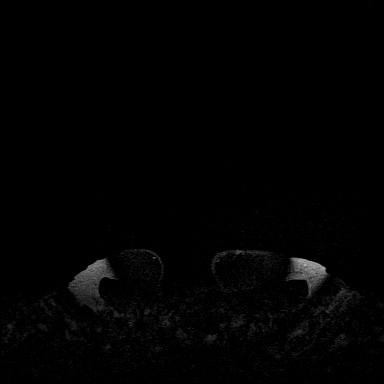

[Series 6: fl3d post-cm 20 · axial · 1.2mm · 1.04mm/px · z∈[-51,+120]mm · 5 of 144 slices shown (2 of 3)]
[im 1/144]
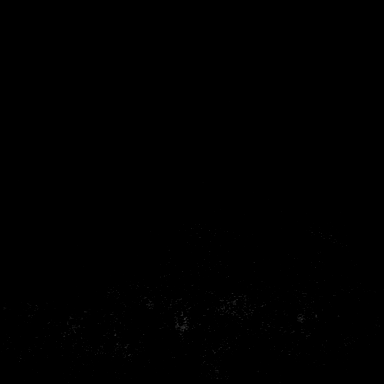
[im 36/144]
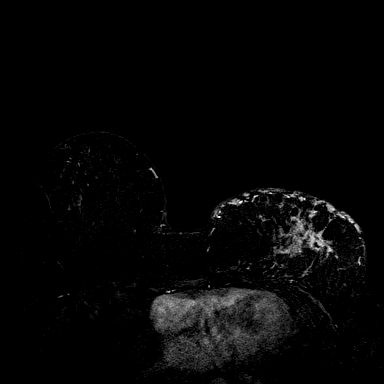
[im 72/144]
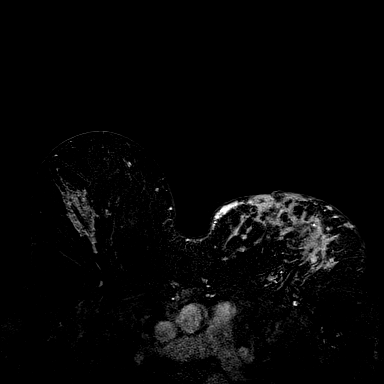
[im 108/144]
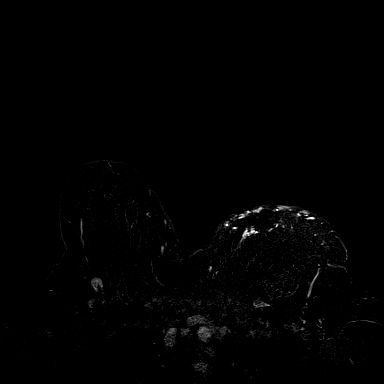
[im 144/144]
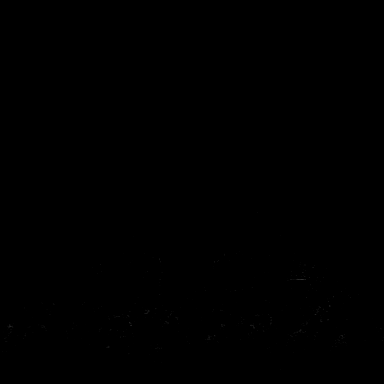

[Series 7: fl3d post-cm 20 · axial · 172.8mm · 1.04mm/px · 1 of 1 slices shown (3 of 3)]
[im 1/1]
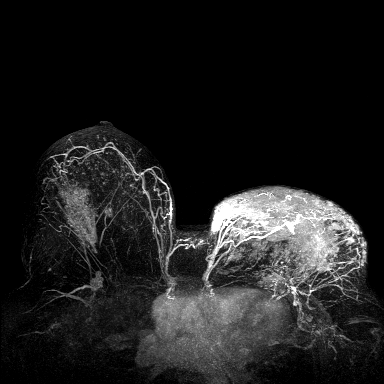

[Series 8: fl3d post-cm 3min · axial · 1.2mm · 1.04mm/px · z∈[-51,+120]mm · 6 of 144 slices shown]
[im 1/144]
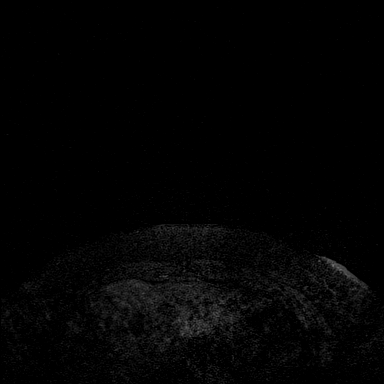
[im 29/144]
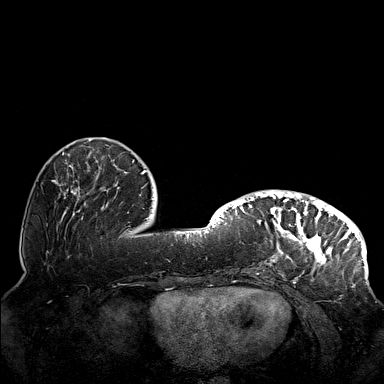
[im 58/144]
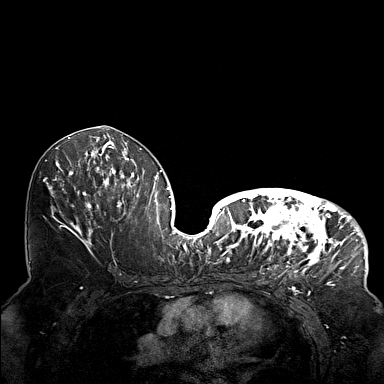
[im 86/144]
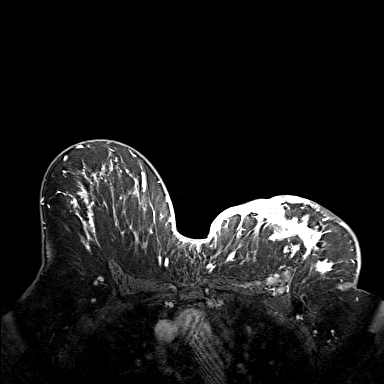
[im 115/144]
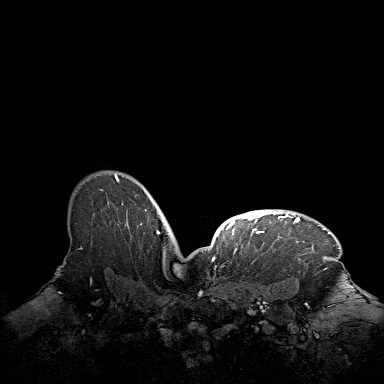
[im 144/144]
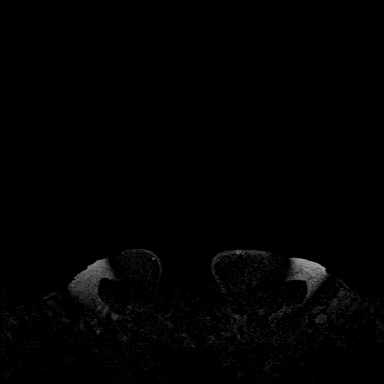

[Series 9: fl3d post-cm 3min_sub · axial · 1.2mm · 1.04mm/px · z∈[-51,+17]mm · 3 of 144 slices shown]
[im 1/144]
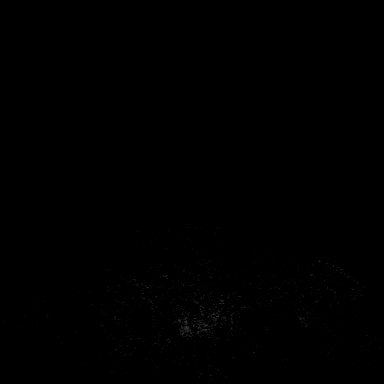
[im 29/144]
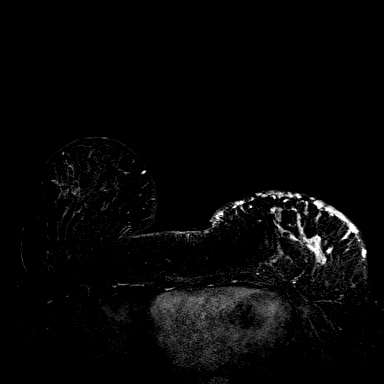
[im 58/144]
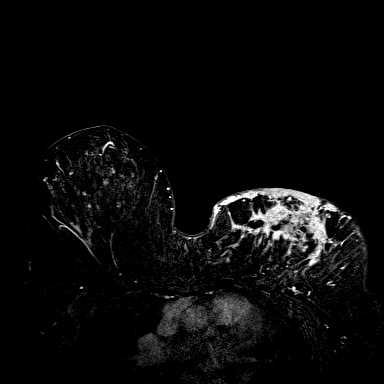

[31 of 48 positions shown; findings below may reference images not displayed]

THREE-DIMENSIONAL MR IMAGE RENDERING ON INDEPENDENT WORKSTATION:

Three-dimensional MR images were rendered by post-processing of the
original MR data on an independent workstation. The
three-dimensional MR images were interpreted, and findings are
reported in the following complete MRI report for this study. Three
dimensional images were evaluated at the independent [employer]
workstation
FINDINGS: Breast composition: b. Scattered fibroglandular tissue.

Background parenchymal enhancement: Mild

Right breast: A 7 x 3.5 x 5 cm area of non masslike enhancement with
persistent kinetics is identified within the upper-outer right
breast (middle and posterior depth). No other suspicious areas of
enhancement are identified.

Left breast: Abnormal irregular mass and non masslike enhancement
throughout all 4 quadrants of the left breast identified. Abnormal
skin thickening and enhancement is identified. Biopsy clip artifact
within the anterior upper inner left breast is identified
corresponding to biopsy-proven malignancy.

Lymph nodes: 3 mildly enlarged lymph nodes measuring up to 1.8 cm
posterior to the left pectoralis minor muscle noted.

A single right axillary lymph node with focally thickened cortex
measuring up to 1 cm is present.

A 1 cm high left internal mammary lymph node is identified.

Ancillary findings: There is a suggestion of a 1 cm pulmonary nodule
within the anterior left upper lobe (image 52 on 3 and 5 minutes
axial postcontrast images)
IMPRESSION: 1. 7 x 3.5 x 5 cm non masslike enhancement within the upper-outer
right breast and focally thickened right axillary lymph node.
Second-look ultrasound is recommended for further evaluation. If the
right breast abnormality is not identified sonographically, MR
guided biopsy is recommended.
2. Diffuse abnormal enhancement throughout the left breast with skin
thickening/enhancement compatible with diffuse multicentric left
breast malignancy and dermal involvement.
3. Mildly enlarged lymph nodes posterior to the left pectoralis
minor muscle and mildly enlarged left internal mammary lymph node,
highly suspicious for metastatic disease.
4. Probable 1 cm left upper lobe pulmonary nodule -metastasis not
excluded. Chest or PET-CT recommended for further evaluation.

RECOMMENDATION:
1. Second-look right breast and axillary ultrasound and possible
biopsies. If no right breast abnormalities are identified
sonographically, MR guided right breast biopsy is recommended for
the upper-outer non masslike enhancement.
2. Chest or PET CT for evaluation of probable left upper lobe
pulmonary nodule.
3. Treatment plan for known left breast malignancy.

BI-RADS CATEGORY  4: Suspicious.

## 2018-11-21 DIAGNOSIS — H5213 Myopia, bilateral: Secondary | ICD-10-CM | POA: Diagnosis not present

## 2018-11-21 DIAGNOSIS — H40013 Open angle with borderline findings, low risk, bilateral: Secondary | ICD-10-CM | POA: Diagnosis not present

## 2018-11-21 DIAGNOSIS — H52223 Regular astigmatism, bilateral: Secondary | ICD-10-CM | POA: Diagnosis not present

## 2018-11-21 DIAGNOSIS — H2513 Age-related nuclear cataract, bilateral: Secondary | ICD-10-CM | POA: Diagnosis not present

## 2018-11-21 DIAGNOSIS — H1851 Endothelial corneal dystrophy: Secondary | ICD-10-CM | POA: Diagnosis not present

## 2018-11-21 DIAGNOSIS — H538 Other visual disturbances: Secondary | ICD-10-CM | POA: Diagnosis not present

## 2018-11-21 DIAGNOSIS — H04123 Dry eye syndrome of bilateral lacrimal glands: Secondary | ICD-10-CM | POA: Diagnosis not present

## 2018-11-21 DIAGNOSIS — H524 Presbyopia: Secondary | ICD-10-CM | POA: Diagnosis not present

## 2018-11-22 IMAGING — DX DG CHEST 1V PORT
1 series · 1 of 1 positions shown · non-contrast
Comparison: Intraoperative radiograph from the same day. Two-view
chest x-ray 07/30/2016.

CLINICAL DATA: Port-A-Cath placement.

EXAM:
PORTABLE CHEST 1 VIEW

[chest ap]
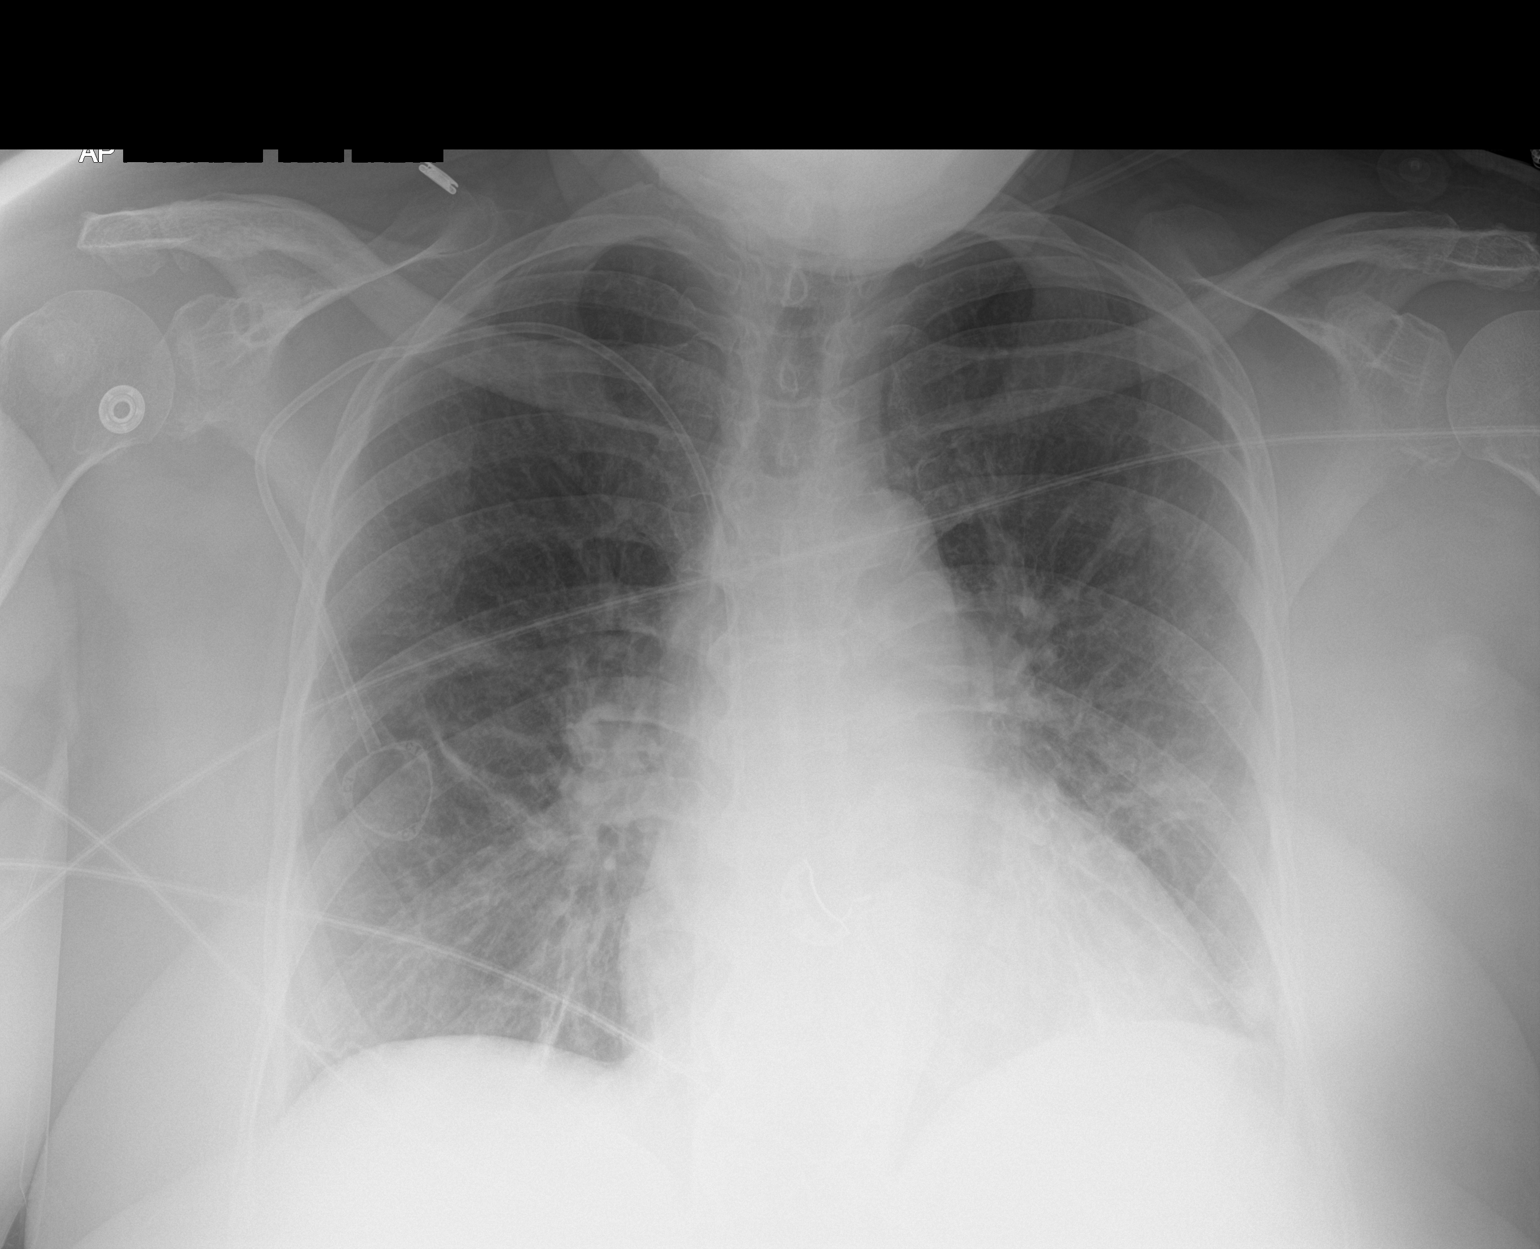

[1 of 1 positions shown; findings below may reference images not displayed]

FINDINGS: The heart is enlarged. Cement direct image demonstrates the tip of
the subclavian Port-A-Cath at the cavoatrial junction. Mild
pulmonary vascular congestion is present. Bilateral atelectasis is
noted. There is no pneumothorax. The visualized soft tissues and
bony thorax are otherwise unremarkable.
IMPRESSION: 1. Interval placement of right subclavian Port-A-Cath without
radiographic evidence for complication.
2. Low lung volumes and mild pulmonary vascular congestion.
3. Bilateral airspace opacities likely reflect atelectasis.

## 2018-12-23 DIAGNOSIS — M47816 Spondylosis without myelopathy or radiculopathy, lumbar region: Secondary | ICD-10-CM | POA: Diagnosis not present

## 2019-01-02 DIAGNOSIS — M25552 Pain in left hip: Secondary | ICD-10-CM | POA: Diagnosis not present

## 2019-01-03 DIAGNOSIS — R269 Unspecified abnormalities of gait and mobility: Secondary | ICD-10-CM | POA: Diagnosis not present

## 2019-01-03 DIAGNOSIS — M25552 Pain in left hip: Secondary | ICD-10-CM | POA: Diagnosis not present

## 2019-01-05 DIAGNOSIS — R269 Unspecified abnormalities of gait and mobility: Secondary | ICD-10-CM | POA: Diagnosis not present

## 2019-01-05 DIAGNOSIS — M25552 Pain in left hip: Secondary | ICD-10-CM | POA: Diagnosis not present

## 2019-01-12 DIAGNOSIS — M1612 Unilateral primary osteoarthritis, left hip: Secondary | ICD-10-CM | POA: Diagnosis not present

## 2019-01-12 DIAGNOSIS — M461 Sacroiliitis, not elsewhere classified: Secondary | ICD-10-CM | POA: Diagnosis not present

## 2019-01-12 DIAGNOSIS — M7062 Trochanteric bursitis, left hip: Secondary | ICD-10-CM | POA: Diagnosis not present

## 2019-01-17 DIAGNOSIS — M899 Disorder of bone, unspecified: Secondary | ICD-10-CM | POA: Diagnosis not present

## 2019-01-17 DIAGNOSIS — C50919 Malignant neoplasm of unspecified site of unspecified female breast: Secondary | ICD-10-CM | POA: Diagnosis not present

## 2019-01-20 DIAGNOSIS — M461 Sacroiliitis, not elsewhere classified: Secondary | ICD-10-CM | POA: Diagnosis not present

## 2019-01-20 DIAGNOSIS — M1612 Unilateral primary osteoarthritis, left hip: Secondary | ICD-10-CM | POA: Diagnosis not present

## 2019-01-20 DIAGNOSIS — C50919 Malignant neoplasm of unspecified site of unspecified female breast: Secondary | ICD-10-CM | POA: Diagnosis not present

## 2019-01-20 DIAGNOSIS — Z853 Personal history of malignant neoplasm of breast: Secondary | ICD-10-CM | POA: Diagnosis not present

## 2019-01-20 DIAGNOSIS — M47816 Spondylosis without myelopathy or radiculopathy, lumbar region: Secondary | ICD-10-CM | POA: Diagnosis not present

## 2019-01-23 DIAGNOSIS — R269 Unspecified abnormalities of gait and mobility: Secondary | ICD-10-CM | POA: Diagnosis not present

## 2019-01-23 DIAGNOSIS — M25552 Pain in left hip: Secondary | ICD-10-CM | POA: Diagnosis not present

## 2019-01-25 DIAGNOSIS — C414 Malignant neoplasm of pelvic bones, sacrum and coccyx: Secondary | ICD-10-CM | POA: Diagnosis not present

## 2019-01-25 DIAGNOSIS — C50912 Malignant neoplasm of unspecified site of left female breast: Secondary | ICD-10-CM | POA: Diagnosis not present

## 2019-01-25 DIAGNOSIS — C7951 Secondary malignant neoplasm of bone: Secondary | ICD-10-CM | POA: Diagnosis not present

## 2019-01-25 DIAGNOSIS — C7801 Secondary malignant neoplasm of right lung: Secondary | ICD-10-CM | POA: Diagnosis not present

## 2019-01-25 DIAGNOSIS — C50919 Malignant neoplasm of unspecified site of unspecified female breast: Secondary | ICD-10-CM | POA: Diagnosis not present

## 2019-01-25 DIAGNOSIS — M84552A Pathological fracture in neoplastic disease, left femur, initial encounter for fracture: Secondary | ICD-10-CM | POA: Diagnosis not present

## 2019-01-26 DIAGNOSIS — Z853 Personal history of malignant neoplasm of breast: Secondary | ICD-10-CM | POA: Diagnosis not present

## 2019-01-26 DIAGNOSIS — C7951 Secondary malignant neoplasm of bone: Secondary | ICD-10-CM | POA: Diagnosis not present

## 2019-02-01 DIAGNOSIS — R110 Nausea: Secondary | ICD-10-CM | POA: Diagnosis not present

## 2019-02-01 DIAGNOSIS — C50919 Malignant neoplasm of unspecified site of unspecified female breast: Secondary | ICD-10-CM | POA: Diagnosis not present

## 2019-02-01 DIAGNOSIS — G893 Neoplasm related pain (acute) (chronic): Secondary | ICD-10-CM | POA: Diagnosis not present

## 2019-02-01 DIAGNOSIS — R112 Nausea with vomiting, unspecified: Secondary | ICD-10-CM | POA: Diagnosis not present

## 2019-02-01 DIAGNOSIS — Z5111 Encounter for antineoplastic chemotherapy: Secondary | ICD-10-CM | POA: Diagnosis not present

## 2019-02-01 DIAGNOSIS — C7951 Secondary malignant neoplasm of bone: Secondary | ICD-10-CM | POA: Diagnosis not present

## 2019-02-01 DIAGNOSIS — C50912 Malignant neoplasm of unspecified site of left female breast: Secondary | ICD-10-CM | POA: Diagnosis not present

## 2019-02-01 DIAGNOSIS — Z170 Estrogen receptor positive status [ER+]: Secondary | ICD-10-CM | POA: Diagnosis not present

## 2019-02-03 DIAGNOSIS — Z0189 Encounter for other specified special examinations: Secondary | ICD-10-CM | POA: Diagnosis not present

## 2019-02-03 DIAGNOSIS — C7951 Secondary malignant neoplasm of bone: Secondary | ICD-10-CM | POA: Diagnosis not present

## 2019-02-03 DIAGNOSIS — C50912 Malignant neoplasm of unspecified site of left female breast: Secondary | ICD-10-CM | POA: Diagnosis not present

## 2019-02-07 DIAGNOSIS — R110 Nausea: Secondary | ICD-10-CM | POA: Diagnosis not present

## 2019-02-07 DIAGNOSIS — R112 Nausea with vomiting, unspecified: Secondary | ICD-10-CM | POA: Diagnosis not present

## 2019-02-07 DIAGNOSIS — C7802 Secondary malignant neoplasm of left lung: Secondary | ICD-10-CM | POA: Diagnosis not present

## 2019-02-07 DIAGNOSIS — C7951 Secondary malignant neoplasm of bone: Secondary | ICD-10-CM | POA: Diagnosis not present

## 2019-02-07 DIAGNOSIS — G893 Neoplasm related pain (acute) (chronic): Secondary | ICD-10-CM | POA: Diagnosis not present

## 2019-02-07 DIAGNOSIS — Z170 Estrogen receptor positive status [ER+]: Secondary | ICD-10-CM | POA: Diagnosis not present

## 2019-02-07 DIAGNOSIS — Z5111 Encounter for antineoplastic chemotherapy: Secondary | ICD-10-CM | POA: Diagnosis not present

## 2019-02-07 DIAGNOSIS — C50912 Malignant neoplasm of unspecified site of left female breast: Secondary | ICD-10-CM | POA: Diagnosis not present

## 2019-02-07 DIAGNOSIS — C50919 Malignant neoplasm of unspecified site of unspecified female breast: Secondary | ICD-10-CM | POA: Diagnosis not present

## 2019-02-08 DIAGNOSIS — C7951 Secondary malignant neoplasm of bone: Secondary | ICD-10-CM | POA: Diagnosis not present

## 2019-02-08 DIAGNOSIS — R112 Nausea with vomiting, unspecified: Secondary | ICD-10-CM | POA: Diagnosis not present

## 2019-02-08 DIAGNOSIS — Z5111 Encounter for antineoplastic chemotherapy: Secondary | ICD-10-CM | POA: Diagnosis not present

## 2019-02-08 DIAGNOSIS — Z170 Estrogen receptor positive status [ER+]: Secondary | ICD-10-CM | POA: Diagnosis not present

## 2019-02-08 DIAGNOSIS — C50912 Malignant neoplasm of unspecified site of left female breast: Secondary | ICD-10-CM | POA: Diagnosis not present

## 2019-02-14 DIAGNOSIS — G893 Neoplasm related pain (acute) (chronic): Secondary | ICD-10-CM | POA: Diagnosis not present

## 2019-02-14 DIAGNOSIS — Z170 Estrogen receptor positive status [ER+]: Secondary | ICD-10-CM | POA: Diagnosis not present

## 2019-02-14 DIAGNOSIS — R110 Nausea: Secondary | ICD-10-CM | POA: Diagnosis not present

## 2019-02-14 DIAGNOSIS — C7951 Secondary malignant neoplasm of bone: Secondary | ICD-10-CM | POA: Diagnosis not present

## 2019-02-14 DIAGNOSIS — C50919 Malignant neoplasm of unspecified site of unspecified female breast: Secondary | ICD-10-CM | POA: Diagnosis not present

## 2019-02-14 DIAGNOSIS — C50812 Malignant neoplasm of overlapping sites of left female breast: Secondary | ICD-10-CM | POA: Diagnosis not present

## 2019-02-15 DIAGNOSIS — R112 Nausea with vomiting, unspecified: Secondary | ICD-10-CM | POA: Diagnosis not present

## 2019-02-15 DIAGNOSIS — Z5111 Encounter for antineoplastic chemotherapy: Secondary | ICD-10-CM | POA: Diagnosis not present

## 2019-02-15 DIAGNOSIS — C50912 Malignant neoplasm of unspecified site of left female breast: Secondary | ICD-10-CM | POA: Diagnosis not present

## 2019-02-15 DIAGNOSIS — C7951 Secondary malignant neoplasm of bone: Secondary | ICD-10-CM | POA: Diagnosis not present

## 2019-02-15 DIAGNOSIS — C7802 Secondary malignant neoplasm of left lung: Secondary | ICD-10-CM | POA: Diagnosis not present

## 2019-02-16 DIAGNOSIS — C7951 Secondary malignant neoplasm of bone: Secondary | ICD-10-CM | POA: Diagnosis not present

## 2019-02-21 DIAGNOSIS — C7951 Secondary malignant neoplasm of bone: Secondary | ICD-10-CM | POA: Diagnosis not present

## 2019-02-22 DIAGNOSIS — C7951 Secondary malignant neoplasm of bone: Secondary | ICD-10-CM | POA: Diagnosis not present

## 2019-02-24 DIAGNOSIS — C7951 Secondary malignant neoplasm of bone: Secondary | ICD-10-CM | POA: Diagnosis not present

## 2019-02-27 DIAGNOSIS — C7951 Secondary malignant neoplasm of bone: Secondary | ICD-10-CM | POA: Diagnosis not present

## 2019-02-27 DIAGNOSIS — C50812 Malignant neoplasm of overlapping sites of left female breast: Secondary | ICD-10-CM | POA: Diagnosis not present

## 2019-02-27 DIAGNOSIS — Z170 Estrogen receptor positive status [ER+]: Secondary | ICD-10-CM | POA: Diagnosis not present

## 2019-02-28 DIAGNOSIS — C7951 Secondary malignant neoplasm of bone: Secondary | ICD-10-CM | POA: Diagnosis not present

## 2019-02-28 DIAGNOSIS — C7801 Secondary malignant neoplasm of right lung: Secondary | ICD-10-CM | POA: Diagnosis not present

## 2019-02-28 DIAGNOSIS — G893 Neoplasm related pain (acute) (chronic): Secondary | ICD-10-CM | POA: Diagnosis not present

## 2019-02-28 DIAGNOSIS — C7802 Secondary malignant neoplasm of left lung: Secondary | ICD-10-CM | POA: Diagnosis not present

## 2019-02-28 DIAGNOSIS — C50812 Malignant neoplasm of overlapping sites of left female breast: Secondary | ICD-10-CM | POA: Diagnosis not present

## 2019-02-28 DIAGNOSIS — C50919 Malignant neoplasm of unspecified site of unspecified female breast: Secondary | ICD-10-CM | POA: Diagnosis not present

## 2019-02-28 DIAGNOSIS — C50912 Malignant neoplasm of unspecified site of left female breast: Secondary | ICD-10-CM | POA: Diagnosis not present

## 2019-02-28 DIAGNOSIS — Z170 Estrogen receptor positive status [ER+]: Secondary | ICD-10-CM | POA: Diagnosis not present

## 2019-02-28 DIAGNOSIS — R110 Nausea: Secondary | ICD-10-CM | POA: Diagnosis not present

## 2019-03-01 DIAGNOSIS — R112 Nausea with vomiting, unspecified: Secondary | ICD-10-CM | POA: Diagnosis not present

## 2019-03-01 DIAGNOSIS — C50912 Malignant neoplasm of unspecified site of left female breast: Secondary | ICD-10-CM | POA: Diagnosis not present

## 2019-03-01 DIAGNOSIS — C50812 Malignant neoplasm of overlapping sites of left female breast: Secondary | ICD-10-CM | POA: Diagnosis not present

## 2019-03-01 DIAGNOSIS — Z5111 Encounter for antineoplastic chemotherapy: Secondary | ICD-10-CM | POA: Diagnosis not present

## 2019-03-01 DIAGNOSIS — Z170 Estrogen receptor positive status [ER+]: Secondary | ICD-10-CM | POA: Diagnosis not present

## 2019-03-01 DIAGNOSIS — C50919 Malignant neoplasm of unspecified site of unspecified female breast: Secondary | ICD-10-CM | POA: Diagnosis not present

## 2019-03-01 DIAGNOSIS — C7951 Secondary malignant neoplasm of bone: Secondary | ICD-10-CM | POA: Diagnosis not present

## 2019-03-01 DIAGNOSIS — G893 Neoplasm related pain (acute) (chronic): Secondary | ICD-10-CM | POA: Diagnosis not present

## 2019-03-01 DIAGNOSIS — R110 Nausea: Secondary | ICD-10-CM | POA: Diagnosis not present

## 2019-03-02 DIAGNOSIS — Z170 Estrogen receptor positive status [ER+]: Secondary | ICD-10-CM | POA: Diagnosis not present

## 2019-03-02 DIAGNOSIS — C50812 Malignant neoplasm of overlapping sites of left female breast: Secondary | ICD-10-CM | POA: Diagnosis not present

## 2019-03-03 DIAGNOSIS — Z170 Estrogen receptor positive status [ER+]: Secondary | ICD-10-CM | POA: Diagnosis not present

## 2019-03-03 DIAGNOSIS — C50812 Malignant neoplasm of overlapping sites of left female breast: Secondary | ICD-10-CM | POA: Diagnosis not present

## 2019-03-06 DIAGNOSIS — Z170 Estrogen receptor positive status [ER+]: Secondary | ICD-10-CM | POA: Diagnosis not present

## 2019-03-06 DIAGNOSIS — C50812 Malignant neoplasm of overlapping sites of left female breast: Secondary | ICD-10-CM | POA: Diagnosis not present

## 2019-03-07 DIAGNOSIS — C50812 Malignant neoplasm of overlapping sites of left female breast: Secondary | ICD-10-CM | POA: Diagnosis not present

## 2019-03-07 DIAGNOSIS — Z170 Estrogen receptor positive status [ER+]: Secondary | ICD-10-CM | POA: Diagnosis not present

## 2019-03-08 DIAGNOSIS — C7802 Secondary malignant neoplasm of left lung: Secondary | ICD-10-CM | POA: Diagnosis not present

## 2019-03-08 DIAGNOSIS — C7801 Secondary malignant neoplasm of right lung: Secondary | ICD-10-CM | POA: Diagnosis not present

## 2019-03-08 DIAGNOSIS — R112 Nausea with vomiting, unspecified: Secondary | ICD-10-CM | POA: Diagnosis not present

## 2019-03-08 DIAGNOSIS — C7951 Secondary malignant neoplasm of bone: Secondary | ICD-10-CM | POA: Diagnosis not present

## 2019-03-08 DIAGNOSIS — C50812 Malignant neoplasm of overlapping sites of left female breast: Secondary | ICD-10-CM | POA: Diagnosis not present

## 2019-03-08 DIAGNOSIS — Z170 Estrogen receptor positive status [ER+]: Secondary | ICD-10-CM | POA: Diagnosis not present

## 2019-03-08 DIAGNOSIS — Z5111 Encounter for antineoplastic chemotherapy: Secondary | ICD-10-CM | POA: Diagnosis not present

## 2019-03-08 DIAGNOSIS — C50912 Malignant neoplasm of unspecified site of left female breast: Secondary | ICD-10-CM | POA: Diagnosis not present

## 2019-03-09 DIAGNOSIS — C50912 Malignant neoplasm of unspecified site of left female breast: Secondary | ICD-10-CM | POA: Diagnosis not present

## 2019-03-09 DIAGNOSIS — C50812 Malignant neoplasm of overlapping sites of left female breast: Secondary | ICD-10-CM | POA: Diagnosis not present

## 2019-03-09 DIAGNOSIS — Z170 Estrogen receptor positive status [ER+]: Secondary | ICD-10-CM | POA: Diagnosis not present

## 2019-03-10 DIAGNOSIS — C50812 Malignant neoplasm of overlapping sites of left female breast: Secondary | ICD-10-CM | POA: Diagnosis not present

## 2019-03-10 DIAGNOSIS — Z170 Estrogen receptor positive status [ER+]: Secondary | ICD-10-CM | POA: Diagnosis not present

## 2019-03-13 DIAGNOSIS — C50912 Malignant neoplasm of unspecified site of left female breast: Secondary | ICD-10-CM | POA: Diagnosis not present

## 2019-03-13 DIAGNOSIS — K649 Unspecified hemorrhoids: Secondary | ICD-10-CM | POA: Diagnosis not present

## 2019-03-13 DIAGNOSIS — C7801 Secondary malignant neoplasm of right lung: Secondary | ICD-10-CM | POA: Diagnosis not present

## 2019-03-13 DIAGNOSIS — T451X5A Adverse effect of antineoplastic and immunosuppressive drugs, initial encounter: Secondary | ICD-10-CM | POA: Diagnosis not present

## 2019-03-13 DIAGNOSIS — E876 Hypokalemia: Secondary | ICD-10-CM | POA: Diagnosis not present

## 2019-03-13 DIAGNOSIS — C771 Secondary and unspecified malignant neoplasm of intrathoracic lymph nodes: Secondary | ICD-10-CM | POA: Diagnosis not present

## 2019-03-13 DIAGNOSIS — E860 Dehydration: Secondary | ICD-10-CM | POA: Diagnosis not present

## 2019-03-13 DIAGNOSIS — C50812 Malignant neoplasm of overlapping sites of left female breast: Secondary | ICD-10-CM | POA: Diagnosis not present

## 2019-03-13 DIAGNOSIS — K521 Toxic gastroenteritis and colitis: Secondary | ICD-10-CM | POA: Diagnosis not present

## 2019-03-13 DIAGNOSIS — C7951 Secondary malignant neoplasm of bone: Secondary | ICD-10-CM | POA: Diagnosis not present

## 2019-03-13 DIAGNOSIS — Z170 Estrogen receptor positive status [ER+]: Secondary | ICD-10-CM | POA: Diagnosis not present

## 2019-03-14 DIAGNOSIS — C50812 Malignant neoplasm of overlapping sites of left female breast: Secondary | ICD-10-CM | POA: Diagnosis not present

## 2019-03-14 DIAGNOSIS — Z170 Estrogen receptor positive status [ER+]: Secondary | ICD-10-CM | POA: Diagnosis not present

## 2019-03-15 DIAGNOSIS — C7802 Secondary malignant neoplasm of left lung: Secondary | ICD-10-CM | POA: Diagnosis not present

## 2019-03-15 DIAGNOSIS — C7951 Secondary malignant neoplasm of bone: Secondary | ICD-10-CM | POA: Diagnosis not present

## 2019-03-15 DIAGNOSIS — C7801 Secondary malignant neoplasm of right lung: Secondary | ICD-10-CM | POA: Diagnosis not present

## 2019-03-15 DIAGNOSIS — E860 Dehydration: Secondary | ICD-10-CM | POA: Diagnosis not present

## 2019-03-15 DIAGNOSIS — C50912 Malignant neoplasm of unspecified site of left female breast: Secondary | ICD-10-CM | POA: Diagnosis not present

## 2019-03-17 DIAGNOSIS — C7801 Secondary malignant neoplasm of right lung: Secondary | ICD-10-CM | POA: Diagnosis not present

## 2019-03-17 DIAGNOSIS — C7951 Secondary malignant neoplasm of bone: Secondary | ICD-10-CM | POA: Diagnosis not present

## 2019-03-17 DIAGNOSIS — C50912 Malignant neoplasm of unspecified site of left female breast: Secondary | ICD-10-CM | POA: Diagnosis not present

## 2019-03-17 DIAGNOSIS — E860 Dehydration: Secondary | ICD-10-CM | POA: Diagnosis not present

## 2019-03-21 DIAGNOSIS — C7951 Secondary malignant neoplasm of bone: Secondary | ICD-10-CM | POA: Diagnosis not present

## 2019-03-21 DIAGNOSIS — R112 Nausea with vomiting, unspecified: Secondary | ICD-10-CM | POA: Diagnosis not present

## 2019-03-21 DIAGNOSIS — C7801 Secondary malignant neoplasm of right lung: Secondary | ICD-10-CM | POA: Diagnosis not present

## 2019-03-21 DIAGNOSIS — C50912 Malignant neoplasm of unspecified site of left female breast: Secondary | ICD-10-CM | POA: Diagnosis not present

## 2019-03-21 DIAGNOSIS — R197 Diarrhea, unspecified: Secondary | ICD-10-CM | POA: Diagnosis not present

## 2019-03-21 DIAGNOSIS — C7802 Secondary malignant neoplasm of left lung: Secondary | ICD-10-CM | POA: Diagnosis not present

## 2019-03-21 DIAGNOSIS — E876 Hypokalemia: Secondary | ICD-10-CM | POA: Diagnosis not present

## 2019-03-22 ENCOUNTER — Other Ambulatory Visit: Payer: Self-pay | Admitting: Oncology

## 2019-03-22 DIAGNOSIS — C7801 Secondary malignant neoplasm of right lung: Secondary | ICD-10-CM | POA: Diagnosis not present

## 2019-03-22 DIAGNOSIS — E876 Hypokalemia: Secondary | ICD-10-CM | POA: Diagnosis not present

## 2019-03-22 DIAGNOSIS — C50912 Malignant neoplasm of unspecified site of left female breast: Secondary | ICD-10-CM | POA: Diagnosis not present

## 2019-03-22 DIAGNOSIS — C7951 Secondary malignant neoplasm of bone: Secondary | ICD-10-CM | POA: Diagnosis not present

## 2019-03-22 NOTE — Progress Notes (Unsigned)
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  Apparently she is having trouble in [place].  She has an oncologist who wants to put her into hospice and she does not want that but apparently she also does not want specific treatments.  I discussed several options with Dr. Criss Rosales including anastrozole plus palbociclib and Herceptin/Perjeta, or TDM 1, or trastuzumab deruxtecan  I will be glad to see [name] again of course on an as-needed basis.

## 2019-03-23 DIAGNOSIS — C50912 Malignant neoplasm of unspecified site of left female breast: Secondary | ICD-10-CM | POA: Diagnosis not present

## 2019-03-23 DIAGNOSIS — E860 Dehydration: Secondary | ICD-10-CM | POA: Diagnosis not present

## 2019-03-23 DIAGNOSIS — Z170 Estrogen receptor positive status [ER+]: Secondary | ICD-10-CM | POA: Diagnosis not present

## 2019-03-24 DIAGNOSIS — Z170 Estrogen receptor positive status [ER+]: Secondary | ICD-10-CM | POA: Diagnosis not present

## 2019-03-24 DIAGNOSIS — E876 Hypokalemia: Secondary | ICD-10-CM | POA: Diagnosis not present

## 2019-03-24 DIAGNOSIS — C50912 Malignant neoplasm of unspecified site of left female breast: Secondary | ICD-10-CM | POA: Diagnosis not present

## 2019-03-27 DIAGNOSIS — E876 Hypokalemia: Secondary | ICD-10-CM | POA: Diagnosis not present

## 2019-03-27 DIAGNOSIS — R112 Nausea with vomiting, unspecified: Secondary | ICD-10-CM | POA: Diagnosis not present

## 2019-03-27 DIAGNOSIS — C50912 Malignant neoplasm of unspecified site of left female breast: Secondary | ICD-10-CM | POA: Diagnosis not present

## 2019-03-27 DIAGNOSIS — Z170 Estrogen receptor positive status [ER+]: Secondary | ICD-10-CM | POA: Diagnosis not present

## 2019-03-27 DIAGNOSIS — C7951 Secondary malignant neoplasm of bone: Secondary | ICD-10-CM | POA: Diagnosis not present

## 2019-03-27 DIAGNOSIS — R197 Diarrhea, unspecified: Secondary | ICD-10-CM | POA: Diagnosis not present

## 2019-03-28 DIAGNOSIS — C50912 Malignant neoplasm of unspecified site of left female breast: Secondary | ICD-10-CM | POA: Diagnosis not present

## 2019-03-28 DIAGNOSIS — R112 Nausea with vomiting, unspecified: Secondary | ICD-10-CM | POA: Diagnosis not present

## 2019-03-28 DIAGNOSIS — C7801 Secondary malignant neoplasm of right lung: Secondary | ICD-10-CM | POA: Diagnosis not present

## 2019-03-28 DIAGNOSIS — E860 Dehydration: Secondary | ICD-10-CM | POA: Diagnosis not present

## 2019-03-28 DIAGNOSIS — E876 Hypokalemia: Secondary | ICD-10-CM | POA: Diagnosis not present

## 2019-03-28 DIAGNOSIS — K521 Toxic gastroenteritis and colitis: Secondary | ICD-10-CM | POA: Diagnosis not present

## 2019-03-28 DIAGNOSIS — R197 Diarrhea, unspecified: Secondary | ICD-10-CM | POA: Diagnosis not present

## 2019-03-28 DIAGNOSIS — C7951 Secondary malignant neoplasm of bone: Secondary | ICD-10-CM | POA: Diagnosis not present

## 2019-03-29 DIAGNOSIS — Z853 Personal history of malignant neoplasm of breast: Secondary | ICD-10-CM | POA: Diagnosis not present

## 2019-03-29 DIAGNOSIS — C50912 Malignant neoplasm of unspecified site of left female breast: Secondary | ICD-10-CM | POA: Diagnosis not present

## 2019-03-29 DIAGNOSIS — Z515 Encounter for palliative care: Secondary | ICD-10-CM | POA: Diagnosis present

## 2019-03-29 DIAGNOSIS — R000 Tachycardia, unspecified: Secondary | ICD-10-CM | POA: Diagnosis present

## 2019-03-29 DIAGNOSIS — C7951 Secondary malignant neoplasm of bone: Secondary | ICD-10-CM | POA: Diagnosis present

## 2019-03-29 DIAGNOSIS — R9431 Abnormal electrocardiogram [ECG] [EKG]: Secondary | ICD-10-CM | POA: Diagnosis not present

## 2019-03-29 DIAGNOSIS — K56609 Unspecified intestinal obstruction, unspecified as to partial versus complete obstruction: Secondary | ICD-10-CM | POA: Diagnosis not present

## 2019-03-29 DIAGNOSIS — R197 Diarrhea, unspecified: Secondary | ICD-10-CM | POA: Diagnosis not present

## 2019-03-29 DIAGNOSIS — K5669 Other partial intestinal obstruction: Secondary | ICD-10-CM | POA: Diagnosis not present

## 2019-03-29 DIAGNOSIS — R918 Other nonspecific abnormal finding of lung field: Secondary | ICD-10-CM | POA: Diagnosis not present

## 2019-03-29 DIAGNOSIS — R140 Abdominal distension (gaseous): Secondary | ICD-10-CM | POA: Diagnosis not present

## 2019-03-29 DIAGNOSIS — T451X5A Adverse effect of antineoplastic and immunosuppressive drugs, initial encounter: Secondary | ICD-10-CM | POA: Diagnosis present

## 2019-03-29 DIAGNOSIS — C7802 Secondary malignant neoplasm of left lung: Secondary | ICD-10-CM | POA: Diagnosis not present

## 2019-03-29 DIAGNOSIS — D649 Anemia, unspecified: Secondary | ICD-10-CM | POA: Diagnosis not present

## 2019-03-29 DIAGNOSIS — R112 Nausea with vomiting, unspecified: Secondary | ICD-10-CM | POA: Diagnosis not present

## 2019-03-29 DIAGNOSIS — C7981 Secondary malignant neoplasm of breast: Secondary | ICD-10-CM | POA: Diagnosis not present

## 2019-03-29 DIAGNOSIS — C50919 Malignant neoplasm of unspecified site of unspecified female breast: Secondary | ICD-10-CM | POA: Diagnosis present

## 2019-03-29 DIAGNOSIS — R109 Unspecified abdominal pain: Secondary | ICD-10-CM | POA: Diagnosis not present

## 2019-03-29 DIAGNOSIS — Z9119 Patient's noncompliance with other medical treatment and regimen: Secondary | ICD-10-CM | POA: Diagnosis not present

## 2019-03-29 DIAGNOSIS — Z1501 Genetic susceptibility to malignant neoplasm of breast: Secondary | ICD-10-CM | POA: Diagnosis not present

## 2019-03-29 DIAGNOSIS — K529 Noninfective gastroenteritis and colitis, unspecified: Secondary | ICD-10-CM | POA: Diagnosis present

## 2019-03-29 DIAGNOSIS — C7801 Secondary malignant neoplasm of right lung: Secondary | ICD-10-CM | POA: Diagnosis not present

## 2019-03-29 DIAGNOSIS — K5900 Constipation, unspecified: Secondary | ICD-10-CM | POA: Diagnosis not present

## 2019-03-29 DIAGNOSIS — E876 Hypokalemia: Secondary | ICD-10-CM | POA: Diagnosis present

## 2019-03-29 DIAGNOSIS — K56600 Partial intestinal obstruction, unspecified as to cause: Secondary | ICD-10-CM | POA: Diagnosis present

## 2019-03-29 DIAGNOSIS — K521 Toxic gastroenteritis and colitis: Secondary | ICD-10-CM | POA: Diagnosis not present

## 2019-03-29 DIAGNOSIS — R1084 Generalized abdominal pain: Secondary | ICD-10-CM | POA: Diagnosis not present

## 2019-04-13 DIAGNOSIS — C7801 Secondary malignant neoplasm of right lung: Secondary | ICD-10-CM | POA: Diagnosis not present

## 2019-04-13 DIAGNOSIS — E876 Hypokalemia: Secondary | ICD-10-CM | POA: Diagnosis not present

## 2019-04-13 DIAGNOSIS — C7802 Secondary malignant neoplasm of left lung: Secondary | ICD-10-CM | POA: Diagnosis not present

## 2019-04-13 DIAGNOSIS — C7951 Secondary malignant neoplasm of bone: Secondary | ICD-10-CM | POA: Diagnosis not present

## 2019-04-13 DIAGNOSIS — C50919 Malignant neoplasm of unspecified site of unspecified female breast: Secondary | ICD-10-CM | POA: Diagnosis not present

## 2019-04-13 DIAGNOSIS — K529 Noninfective gastroenteritis and colitis, unspecified: Secondary | ICD-10-CM | POA: Diagnosis not present

## 2019-04-13 DIAGNOSIS — C771 Secondary and unspecified malignant neoplasm of intrathoracic lymph nodes: Secondary | ICD-10-CM | POA: Diagnosis not present

## 2019-04-13 DIAGNOSIS — C50912 Malignant neoplasm of unspecified site of left female breast: Secondary | ICD-10-CM | POA: Diagnosis not present

## 2019-04-14 DIAGNOSIS — C7801 Secondary malignant neoplasm of right lung: Secondary | ICD-10-CM | POA: Diagnosis not present

## 2019-04-14 DIAGNOSIS — C7802 Secondary malignant neoplasm of left lung: Secondary | ICD-10-CM | POA: Diagnosis not present

## 2019-04-14 DIAGNOSIS — C7951 Secondary malignant neoplasm of bone: Secondary | ICD-10-CM | POA: Diagnosis not present

## 2019-04-14 DIAGNOSIS — E876 Hypokalemia: Secondary | ICD-10-CM | POA: Diagnosis not present

## 2019-04-14 DIAGNOSIS — C50912 Malignant neoplasm of unspecified site of left female breast: Secondary | ICD-10-CM | POA: Diagnosis not present

## 2019-04-19 DIAGNOSIS — E876 Hypokalemia: Secondary | ICD-10-CM | POA: Diagnosis not present

## 2019-04-19 DIAGNOSIS — R197 Diarrhea, unspecified: Secondary | ICD-10-CM | POA: Diagnosis not present

## 2019-04-19 DIAGNOSIS — R110 Nausea: Secondary | ICD-10-CM | POA: Diagnosis not present

## 2019-04-19 DIAGNOSIS — C50912 Malignant neoplasm of unspecified site of left female breast: Secondary | ICD-10-CM | POA: Diagnosis not present

## 2019-04-19 DIAGNOSIS — C7951 Secondary malignant neoplasm of bone: Secondary | ICD-10-CM | POA: Diagnosis not present

## 2019-04-19 DIAGNOSIS — C7801 Secondary malignant neoplasm of right lung: Secondary | ICD-10-CM | POA: Diagnosis not present

## 2019-05-09 DIAGNOSIS — C50912 Malignant neoplasm of unspecified site of left female breast: Secondary | ICD-10-CM | POA: Diagnosis not present

## 2019-05-09 DIAGNOSIS — C7951 Secondary malignant neoplasm of bone: Secondary | ICD-10-CM | POA: Diagnosis not present

## 2019-05-09 DIAGNOSIS — C7801 Secondary malignant neoplasm of right lung: Secondary | ICD-10-CM | POA: Diagnosis not present

## 2019-05-09 DIAGNOSIS — C771 Secondary and unspecified malignant neoplasm of intrathoracic lymph nodes: Secondary | ICD-10-CM | POA: Diagnosis not present

## 2019-05-09 DIAGNOSIS — Z79899 Other long term (current) drug therapy: Secondary | ICD-10-CM | POA: Diagnosis not present

## 2019-05-10 DIAGNOSIS — C7802 Secondary malignant neoplasm of left lung: Secondary | ICD-10-CM | POA: Diagnosis not present

## 2019-05-10 DIAGNOSIS — D649 Anemia, unspecified: Secondary | ICD-10-CM | POA: Diagnosis not present

## 2019-05-10 DIAGNOSIS — C50912 Malignant neoplasm of unspecified site of left female breast: Secondary | ICD-10-CM | POA: Diagnosis not present

## 2019-05-10 DIAGNOSIS — G893 Neoplasm related pain (acute) (chronic): Secondary | ICD-10-CM | POA: Diagnosis not present

## 2019-05-10 DIAGNOSIS — C7801 Secondary malignant neoplasm of right lung: Secondary | ICD-10-CM | POA: Diagnosis not present

## 2019-05-10 DIAGNOSIS — T451X5D Adverse effect of antineoplastic and immunosuppressive drugs, subsequent encounter: Secondary | ICD-10-CM | POA: Diagnosis not present

## 2019-05-10 DIAGNOSIS — M84452D Pathological fracture, left femur, subsequent encounter for fracture with routine healing: Secondary | ICD-10-CM | POA: Diagnosis not present

## 2019-05-10 DIAGNOSIS — C771 Secondary and unspecified malignant neoplasm of intrathoracic lymph nodes: Secondary | ICD-10-CM | POA: Diagnosis not present

## 2019-05-10 DIAGNOSIS — Z9012 Acquired absence of left breast and nipple: Secondary | ICD-10-CM | POA: Diagnosis not present

## 2019-05-10 DIAGNOSIS — C7951 Secondary malignant neoplasm of bone: Secondary | ICD-10-CM | POA: Diagnosis not present

## 2019-05-10 DIAGNOSIS — Z9181 History of falling: Secondary | ICD-10-CM | POA: Diagnosis not present

## 2019-05-10 DIAGNOSIS — Z6831 Body mass index (BMI) 31.0-31.9, adult: Secondary | ICD-10-CM | POA: Diagnosis not present

## 2019-05-10 DIAGNOSIS — K521 Toxic gastroenteritis and colitis: Secondary | ICD-10-CM | POA: Diagnosis not present

## 2019-05-10 DIAGNOSIS — R634 Abnormal weight loss: Secondary | ICD-10-CM | POA: Diagnosis not present

## 2019-05-10 DIAGNOSIS — Z9221 Personal history of antineoplastic chemotherapy: Secondary | ICD-10-CM | POA: Diagnosis not present

## 2019-05-10 DIAGNOSIS — Z170 Estrogen receptor positive status [ER+]: Secondary | ICD-10-CM | POA: Diagnosis not present

## 2019-05-11 DIAGNOSIS — C7951 Secondary malignant neoplasm of bone: Secondary | ICD-10-CM | POA: Diagnosis not present

## 2019-05-11 DIAGNOSIS — C50912 Malignant neoplasm of unspecified site of left female breast: Secondary | ICD-10-CM | POA: Diagnosis not present

## 2019-05-11 DIAGNOSIS — C7802 Secondary malignant neoplasm of left lung: Secondary | ICD-10-CM | POA: Diagnosis not present

## 2019-05-11 DIAGNOSIS — C7801 Secondary malignant neoplasm of right lung: Secondary | ICD-10-CM | POA: Diagnosis not present

## 2019-05-22 DIAGNOSIS — K529 Noninfective gastroenteritis and colitis, unspecified: Secondary | ICD-10-CM | POA: Diagnosis not present

## 2019-05-22 DIAGNOSIS — T451X5D Adverse effect of antineoplastic and immunosuppressive drugs, subsequent encounter: Secondary | ICD-10-CM | POA: Diagnosis not present

## 2019-05-22 DIAGNOSIS — K219 Gastro-esophageal reflux disease without esophagitis: Secondary | ICD-10-CM | POA: Diagnosis not present

## 2019-06-01 DIAGNOSIS — R109 Unspecified abdominal pain: Secondary | ICD-10-CM | POA: Diagnosis not present

## 2019-06-01 DIAGNOSIS — C50919 Malignant neoplasm of unspecified site of unspecified female breast: Secondary | ICD-10-CM | POA: Diagnosis not present

## 2019-06-01 DIAGNOSIS — E876 Hypokalemia: Secondary | ICD-10-CM | POA: Diagnosis not present

## 2019-06-09 DIAGNOSIS — C50912 Malignant neoplasm of unspecified site of left female breast: Secondary | ICD-10-CM | POA: Diagnosis not present

## 2019-06-09 DIAGNOSIS — T451X5D Adverse effect of antineoplastic and immunosuppressive drugs, subsequent encounter: Secondary | ICD-10-CM | POA: Diagnosis not present

## 2019-06-09 DIAGNOSIS — C7951 Secondary malignant neoplasm of bone: Secondary | ICD-10-CM | POA: Diagnosis not present

## 2019-06-09 DIAGNOSIS — Z9012 Acquired absence of left breast and nipple: Secondary | ICD-10-CM | POA: Diagnosis not present

## 2019-06-09 DIAGNOSIS — D649 Anemia, unspecified: Secondary | ICD-10-CM | POA: Diagnosis not present

## 2019-06-09 DIAGNOSIS — R634 Abnormal weight loss: Secondary | ICD-10-CM | POA: Diagnosis not present

## 2019-06-09 DIAGNOSIS — Z9181 History of falling: Secondary | ICD-10-CM | POA: Diagnosis not present

## 2019-06-09 DIAGNOSIS — C7801 Secondary malignant neoplasm of right lung: Secondary | ICD-10-CM | POA: Diagnosis not present

## 2019-06-09 DIAGNOSIS — Z6831 Body mass index (BMI) 31.0-31.9, adult: Secondary | ICD-10-CM | POA: Diagnosis not present

## 2019-06-09 DIAGNOSIS — K521 Toxic gastroenteritis and colitis: Secondary | ICD-10-CM | POA: Diagnosis not present

## 2019-06-09 DIAGNOSIS — Z9221 Personal history of antineoplastic chemotherapy: Secondary | ICD-10-CM | POA: Diagnosis not present

## 2019-06-09 DIAGNOSIS — C7802 Secondary malignant neoplasm of left lung: Secondary | ICD-10-CM | POA: Diagnosis not present

## 2019-06-09 DIAGNOSIS — M84452D Pathological fracture, left femur, subsequent encounter for fracture with routine healing: Secondary | ICD-10-CM | POA: Diagnosis not present

## 2019-06-09 DIAGNOSIS — Z170 Estrogen receptor positive status [ER+]: Secondary | ICD-10-CM | POA: Diagnosis not present

## 2019-06-09 DIAGNOSIS — C771 Secondary and unspecified malignant neoplasm of intrathoracic lymph nodes: Secondary | ICD-10-CM | POA: Diagnosis not present

## 2019-06-09 DIAGNOSIS — G893 Neoplasm related pain (acute) (chronic): Secondary | ICD-10-CM | POA: Diagnosis not present

## 2019-06-10 DIAGNOSIS — C7801 Secondary malignant neoplasm of right lung: Secondary | ICD-10-CM | POA: Diagnosis not present

## 2019-06-10 DIAGNOSIS — C7951 Secondary malignant neoplasm of bone: Secondary | ICD-10-CM | POA: Diagnosis not present

## 2019-06-10 DIAGNOSIS — C50912 Malignant neoplasm of unspecified site of left female breast: Secondary | ICD-10-CM | POA: Diagnosis not present

## 2019-06-10 DIAGNOSIS — C7802 Secondary malignant neoplasm of left lung: Secondary | ICD-10-CM | POA: Diagnosis not present

## 2019-06-13 DIAGNOSIS — D649 Anemia, unspecified: Secondary | ICD-10-CM | POA: Diagnosis not present

## 2019-06-13 DIAGNOSIS — G893 Neoplasm related pain (acute) (chronic): Secondary | ICD-10-CM | POA: Diagnosis not present

## 2019-06-13 DIAGNOSIS — K521 Toxic gastroenteritis and colitis: Secondary | ICD-10-CM | POA: Diagnosis not present

## 2019-06-13 DIAGNOSIS — M84452D Pathological fracture, left femur, subsequent encounter for fracture with routine healing: Secondary | ICD-10-CM | POA: Diagnosis not present

## 2019-06-13 DIAGNOSIS — R634 Abnormal weight loss: Secondary | ICD-10-CM | POA: Diagnosis not present

## 2019-06-13 DIAGNOSIS — C7951 Secondary malignant neoplasm of bone: Secondary | ICD-10-CM | POA: Diagnosis not present

## 2019-06-19 DIAGNOSIS — C7951 Secondary malignant neoplasm of bone: Secondary | ICD-10-CM | POA: Diagnosis not present

## 2019-06-19 DIAGNOSIS — D649 Anemia, unspecified: Secondary | ICD-10-CM | POA: Diagnosis not present

## 2019-06-19 DIAGNOSIS — M84452D Pathological fracture, left femur, subsequent encounter for fracture with routine healing: Secondary | ICD-10-CM | POA: Diagnosis not present

## 2019-06-19 DIAGNOSIS — K521 Toxic gastroenteritis and colitis: Secondary | ICD-10-CM | POA: Diagnosis not present

## 2019-06-19 DIAGNOSIS — R634 Abnormal weight loss: Secondary | ICD-10-CM | POA: Diagnosis not present

## 2019-06-19 DIAGNOSIS — G893 Neoplasm related pain (acute) (chronic): Secondary | ICD-10-CM | POA: Diagnosis not present

## 2019-06-22 DIAGNOSIS — C771 Secondary and unspecified malignant neoplasm of intrathoracic lymph nodes: Secondary | ICD-10-CM | POA: Diagnosis not present

## 2019-06-22 DIAGNOSIS — Z0189 Encounter for other specified special examinations: Secondary | ICD-10-CM | POA: Diagnosis not present

## 2019-06-28 DIAGNOSIS — Z809 Family history of malignant neoplasm, unspecified: Secondary | ICD-10-CM | POA: Diagnosis not present

## 2019-06-28 DIAGNOSIS — C771 Secondary and unspecified malignant neoplasm of intrathoracic lymph nodes: Secondary | ICD-10-CM | POA: Diagnosis not present

## 2019-06-28 DIAGNOSIS — C7951 Secondary malignant neoplasm of bone: Secondary | ICD-10-CM | POA: Diagnosis not present

## 2019-06-28 DIAGNOSIS — C7802 Secondary malignant neoplasm of left lung: Secondary | ICD-10-CM | POA: Diagnosis not present

## 2019-06-28 DIAGNOSIS — D649 Anemia, unspecified: Secondary | ICD-10-CM | POA: Diagnosis not present

## 2019-06-28 DIAGNOSIS — C50912 Malignant neoplasm of unspecified site of left female breast: Secondary | ICD-10-CM | POA: Diagnosis not present

## 2019-06-28 DIAGNOSIS — C7801 Secondary malignant neoplasm of right lung: Secondary | ICD-10-CM | POA: Diagnosis not present

## 2019-06-29 DIAGNOSIS — D649 Anemia, unspecified: Secondary | ICD-10-CM | POA: Diagnosis not present

## 2019-06-29 DIAGNOSIS — C7951 Secondary malignant neoplasm of bone: Secondary | ICD-10-CM | POA: Diagnosis not present

## 2019-06-29 DIAGNOSIS — C771 Secondary and unspecified malignant neoplasm of intrathoracic lymph nodes: Secondary | ICD-10-CM | POA: Diagnosis not present

## 2019-06-29 DIAGNOSIS — C50912 Malignant neoplasm of unspecified site of left female breast: Secondary | ICD-10-CM | POA: Diagnosis not present

## 2019-06-29 DIAGNOSIS — C7801 Secondary malignant neoplasm of right lung: Secondary | ICD-10-CM | POA: Diagnosis not present

## 2019-06-29 DIAGNOSIS — C7802 Secondary malignant neoplasm of left lung: Secondary | ICD-10-CM | POA: Diagnosis not present

## 2019-06-29 DIAGNOSIS — C7800 Secondary malignant neoplasm of unspecified lung: Secondary | ICD-10-CM | POA: Diagnosis not present

## 2019-06-30 DIAGNOSIS — C7951 Secondary malignant neoplasm of bone: Secondary | ICD-10-CM | POA: Diagnosis not present

## 2019-06-30 DIAGNOSIS — Z5111 Encounter for antineoplastic chemotherapy: Secondary | ICD-10-CM | POA: Diagnosis not present

## 2019-06-30 DIAGNOSIS — C7801 Secondary malignant neoplasm of right lung: Secondary | ICD-10-CM | POA: Diagnosis not present

## 2019-06-30 DIAGNOSIS — C7802 Secondary malignant neoplasm of left lung: Secondary | ICD-10-CM | POA: Diagnosis not present

## 2019-06-30 DIAGNOSIS — C50912 Malignant neoplasm of unspecified site of left female breast: Secondary | ICD-10-CM | POA: Diagnosis not present

## 2019-07-11 DIAGNOSIS — C7951 Secondary malignant neoplasm of bone: Secondary | ICD-10-CM | POA: Diagnosis not present

## 2019-07-11 DIAGNOSIS — C7802 Secondary malignant neoplasm of left lung: Secondary | ICD-10-CM | POA: Diagnosis not present

## 2019-07-11 DIAGNOSIS — C7801 Secondary malignant neoplasm of right lung: Secondary | ICD-10-CM | POA: Diagnosis not present

## 2019-07-11 DIAGNOSIS — C50912 Malignant neoplasm of unspecified site of left female breast: Secondary | ICD-10-CM | POA: Diagnosis not present

## 2019-07-20 DIAGNOSIS — D649 Anemia, unspecified: Secondary | ICD-10-CM | POA: Diagnosis not present

## 2019-07-20 DIAGNOSIS — C7951 Secondary malignant neoplasm of bone: Secondary | ICD-10-CM | POA: Diagnosis not present

## 2019-07-20 DIAGNOSIS — Z5111 Encounter for antineoplastic chemotherapy: Secondary | ICD-10-CM | POA: Diagnosis not present

## 2019-07-20 DIAGNOSIS — C50912 Malignant neoplasm of unspecified site of left female breast: Secondary | ICD-10-CM | POA: Diagnosis not present

## 2019-07-20 DIAGNOSIS — R109 Unspecified abdominal pain: Secondary | ICD-10-CM | POA: Diagnosis not present

## 2019-07-20 DIAGNOSIS — C7801 Secondary malignant neoplasm of right lung: Secondary | ICD-10-CM | POA: Diagnosis not present

## 2019-08-11 DIAGNOSIS — C7951 Secondary malignant neoplasm of bone: Secondary | ICD-10-CM | POA: Diagnosis not present

## 2019-08-11 DIAGNOSIS — C50912 Malignant neoplasm of unspecified site of left female breast: Secondary | ICD-10-CM | POA: Diagnosis not present

## 2019-08-11 DIAGNOSIS — C7802 Secondary malignant neoplasm of left lung: Secondary | ICD-10-CM | POA: Diagnosis not present

## 2019-08-11 DIAGNOSIS — C7801 Secondary malignant neoplasm of right lung: Secondary | ICD-10-CM | POA: Diagnosis not present

## 2019-08-16 DIAGNOSIS — C50912 Malignant neoplasm of unspecified site of left female breast: Secondary | ICD-10-CM | POA: Diagnosis not present

## 2019-08-16 DIAGNOSIS — C7951 Secondary malignant neoplasm of bone: Secondary | ICD-10-CM | POA: Diagnosis not present

## 2019-08-16 DIAGNOSIS — R0600 Dyspnea, unspecified: Secondary | ICD-10-CM | POA: Diagnosis not present

## 2019-08-16 DIAGNOSIS — C7801 Secondary malignant neoplasm of right lung: Secondary | ICD-10-CM | POA: Diagnosis not present

## 2019-08-17 DIAGNOSIS — Z5111 Encounter for antineoplastic chemotherapy: Secondary | ICD-10-CM | POA: Diagnosis not present

## 2019-08-17 DIAGNOSIS — C7951 Secondary malignant neoplasm of bone: Secondary | ICD-10-CM | POA: Diagnosis not present

## 2019-08-17 DIAGNOSIS — C7801 Secondary malignant neoplasm of right lung: Secondary | ICD-10-CM | POA: Diagnosis not present

## 2019-08-17 DIAGNOSIS — C50912 Malignant neoplasm of unspecified site of left female breast: Secondary | ICD-10-CM | POA: Diagnosis not present

## 2019-08-31 DIAGNOSIS — I371 Nonrheumatic pulmonary valve insufficiency: Secondary | ICD-10-CM | POA: Diagnosis not present

## 2019-08-31 DIAGNOSIS — Z01818 Encounter for other preprocedural examination: Secondary | ICD-10-CM | POA: Diagnosis not present

## 2019-08-31 DIAGNOSIS — C50919 Malignant neoplasm of unspecified site of unspecified female breast: Secondary | ICD-10-CM | POA: Diagnosis not present

## 2019-09-07 DIAGNOSIS — C7801 Secondary malignant neoplasm of right lung: Secondary | ICD-10-CM | POA: Diagnosis not present

## 2019-09-07 DIAGNOSIS — R1111 Vomiting without nausea: Secondary | ICD-10-CM | POA: Diagnosis not present

## 2019-09-07 DIAGNOSIS — D649 Anemia, unspecified: Secondary | ICD-10-CM | POA: Diagnosis not present

## 2019-09-07 DIAGNOSIS — D62 Acute posthemorrhagic anemia: Secondary | ICD-10-CM | POA: Diagnosis present

## 2019-09-07 DIAGNOSIS — M79605 Pain in left leg: Secondary | ICD-10-CM | POA: Diagnosis not present

## 2019-09-07 DIAGNOSIS — C799 Secondary malignant neoplasm of unspecified site: Secondary | ICD-10-CM | POA: Diagnosis not present

## 2019-09-07 DIAGNOSIS — M84552A Pathological fracture in neoplastic disease, left femur, initial encounter for fracture: Secondary | ICD-10-CM | POA: Diagnosis present

## 2019-09-07 DIAGNOSIS — C7800 Secondary malignant neoplasm of unspecified lung: Secondary | ICD-10-CM | POA: Diagnosis present

## 2019-09-07 DIAGNOSIS — R918 Other nonspecific abnormal finding of lung field: Secondary | ICD-10-CM | POA: Diagnosis not present

## 2019-09-07 DIAGNOSIS — T783XXA Angioneurotic edema, initial encounter: Secondary | ICD-10-CM | POA: Diagnosis not present

## 2019-09-07 DIAGNOSIS — R609 Edema, unspecified: Secondary | ICD-10-CM | POA: Diagnosis not present

## 2019-09-07 DIAGNOSIS — M79652 Pain in left thigh: Secondary | ICD-10-CM | POA: Diagnosis not present

## 2019-09-07 DIAGNOSIS — Z5111 Encounter for antineoplastic chemotherapy: Secondary | ICD-10-CM | POA: Diagnosis not present

## 2019-09-07 DIAGNOSIS — M899 Disorder of bone, unspecified: Secondary | ICD-10-CM | POA: Diagnosis not present

## 2019-09-07 DIAGNOSIS — Z853 Personal history of malignant neoplasm of breast: Secondary | ICD-10-CM | POA: Diagnosis not present

## 2019-09-07 DIAGNOSIS — M25552 Pain in left hip: Secondary | ICD-10-CM | POA: Diagnosis not present

## 2019-09-07 DIAGNOSIS — W1789XA Other fall from one level to another, initial encounter: Secondary | ICD-10-CM | POA: Diagnosis not present

## 2019-09-07 DIAGNOSIS — R52 Pain, unspecified: Secondary | ICD-10-CM | POA: Diagnosis not present

## 2019-09-07 DIAGNOSIS — S7292XA Unspecified fracture of left femur, initial encounter for closed fracture: Secondary | ICD-10-CM | POA: Diagnosis not present

## 2019-09-07 DIAGNOSIS — C7802 Secondary malignant neoplasm of left lung: Secondary | ICD-10-CM | POA: Diagnosis not present

## 2019-09-07 DIAGNOSIS — S7222XA Displaced subtrochanteric fracture of left femur, initial encounter for closed fracture: Secondary | ICD-10-CM | POA: Diagnosis not present

## 2019-09-07 DIAGNOSIS — C7951 Secondary malignant neoplasm of bone: Secondary | ICD-10-CM | POA: Diagnosis present

## 2019-09-07 DIAGNOSIS — C50919 Malignant neoplasm of unspecified site of unspecified female breast: Secondary | ICD-10-CM | POA: Diagnosis not present

## 2019-09-07 DIAGNOSIS — D630 Anemia in neoplastic disease: Secondary | ICD-10-CM | POA: Diagnosis present

## 2019-09-07 DIAGNOSIS — C50912 Malignant neoplasm of unspecified site of left female breast: Secondary | ICD-10-CM | POA: Diagnosis not present

## 2019-09-07 DIAGNOSIS — S72322A Displaced transverse fracture of shaft of left femur, initial encounter for closed fracture: Secondary | ICD-10-CM | POA: Diagnosis not present

## 2019-09-08 DIAGNOSIS — C7802 Secondary malignant neoplasm of left lung: Secondary | ICD-10-CM | POA: Diagnosis not present

## 2019-09-08 DIAGNOSIS — C7951 Secondary malignant neoplasm of bone: Secondary | ICD-10-CM | POA: Diagnosis not present

## 2019-09-08 DIAGNOSIS — C7801 Secondary malignant neoplasm of right lung: Secondary | ICD-10-CM | POA: Diagnosis not present

## 2019-09-08 DIAGNOSIS — C50912 Malignant neoplasm of unspecified site of left female breast: Secondary | ICD-10-CM | POA: Diagnosis not present

## 2019-09-14 DIAGNOSIS — Z7901 Long term (current) use of anticoagulants: Secondary | ICD-10-CM | POA: Diagnosis not present

## 2019-09-14 DIAGNOSIS — D630 Anemia in neoplastic disease: Secondary | ICD-10-CM | POA: Diagnosis not present

## 2019-09-14 DIAGNOSIS — M8458XD Pathological fracture in neoplastic disease, other specified site, subsequent encounter for fracture with routine healing: Secondary | ICD-10-CM | POA: Diagnosis not present

## 2019-09-14 DIAGNOSIS — C7951 Secondary malignant neoplasm of bone: Secondary | ICD-10-CM | POA: Diagnosis not present

## 2019-09-14 DIAGNOSIS — Z9010 Acquired absence of unspecified breast and nipple: Secondary | ICD-10-CM | POA: Diagnosis not present

## 2019-09-14 DIAGNOSIS — C7800 Secondary malignant neoplasm of unspecified lung: Secondary | ICD-10-CM | POA: Diagnosis not present

## 2019-09-14 DIAGNOSIS — C50919 Malignant neoplasm of unspecified site of unspecified female breast: Secondary | ICD-10-CM | POA: Diagnosis not present

## 2019-09-19 DIAGNOSIS — Z9010 Acquired absence of unspecified breast and nipple: Secondary | ICD-10-CM | POA: Diagnosis not present

## 2019-09-19 DIAGNOSIS — D630 Anemia in neoplastic disease: Secondary | ICD-10-CM | POA: Diagnosis not present

## 2019-09-19 DIAGNOSIS — C7800 Secondary malignant neoplasm of unspecified lung: Secondary | ICD-10-CM | POA: Diagnosis not present

## 2019-09-19 DIAGNOSIS — C50919 Malignant neoplasm of unspecified site of unspecified female breast: Secondary | ICD-10-CM | POA: Diagnosis not present

## 2019-09-19 DIAGNOSIS — M8458XD Pathological fracture in neoplastic disease, other specified site, subsequent encounter for fracture with routine healing: Secondary | ICD-10-CM | POA: Diagnosis not present

## 2019-09-19 DIAGNOSIS — C7951 Secondary malignant neoplasm of bone: Secondary | ICD-10-CM | POA: Diagnosis not present

## 2019-09-21 DIAGNOSIS — C7800 Secondary malignant neoplasm of unspecified lung: Secondary | ICD-10-CM | POA: Diagnosis not present

## 2019-09-21 DIAGNOSIS — D630 Anemia in neoplastic disease: Secondary | ICD-10-CM | POA: Diagnosis not present

## 2019-09-21 DIAGNOSIS — C7951 Secondary malignant neoplasm of bone: Secondary | ICD-10-CM | POA: Diagnosis not present

## 2019-09-21 DIAGNOSIS — M8458XD Pathological fracture in neoplastic disease, other specified site, subsequent encounter for fracture with routine healing: Secondary | ICD-10-CM | POA: Diagnosis not present

## 2019-09-21 DIAGNOSIS — C50919 Malignant neoplasm of unspecified site of unspecified female breast: Secondary | ICD-10-CM | POA: Diagnosis not present

## 2019-09-21 DIAGNOSIS — Z9010 Acquired absence of unspecified breast and nipple: Secondary | ICD-10-CM | POA: Diagnosis not present

## 2019-09-22 DIAGNOSIS — C7800 Secondary malignant neoplasm of unspecified lung: Secondary | ICD-10-CM | POA: Diagnosis not present

## 2019-09-22 DIAGNOSIS — Z9010 Acquired absence of unspecified breast and nipple: Secondary | ICD-10-CM | POA: Diagnosis not present

## 2019-09-22 DIAGNOSIS — D630 Anemia in neoplastic disease: Secondary | ICD-10-CM | POA: Diagnosis not present

## 2019-09-22 DIAGNOSIS — M8458XD Pathological fracture in neoplastic disease, other specified site, subsequent encounter for fracture with routine healing: Secondary | ICD-10-CM | POA: Diagnosis not present

## 2019-09-22 DIAGNOSIS — C50919 Malignant neoplasm of unspecified site of unspecified female breast: Secondary | ICD-10-CM | POA: Diagnosis not present

## 2019-09-22 DIAGNOSIS — C7951 Secondary malignant neoplasm of bone: Secondary | ICD-10-CM | POA: Diagnosis not present

## 2019-09-26 DIAGNOSIS — C7800 Secondary malignant neoplasm of unspecified lung: Secondary | ICD-10-CM | POA: Diagnosis not present

## 2019-09-26 DIAGNOSIS — C50919 Malignant neoplasm of unspecified site of unspecified female breast: Secondary | ICD-10-CM | POA: Diagnosis not present

## 2019-09-26 DIAGNOSIS — Z9010 Acquired absence of unspecified breast and nipple: Secondary | ICD-10-CM | POA: Diagnosis not present

## 2019-09-26 DIAGNOSIS — C7951 Secondary malignant neoplasm of bone: Secondary | ICD-10-CM | POA: Diagnosis not present

## 2019-09-26 DIAGNOSIS — M8458XD Pathological fracture in neoplastic disease, other specified site, subsequent encounter for fracture with routine healing: Secondary | ICD-10-CM | POA: Diagnosis not present

## 2019-09-26 DIAGNOSIS — D630 Anemia in neoplastic disease: Secondary | ICD-10-CM | POA: Diagnosis not present

## 2019-09-27 DIAGNOSIS — C50812 Malignant neoplasm of overlapping sites of left female breast: Secondary | ICD-10-CM | POA: Diagnosis not present

## 2019-09-28 DIAGNOSIS — C50812 Malignant neoplasm of overlapping sites of left female breast: Secondary | ICD-10-CM | POA: Diagnosis not present

## 2019-09-28 DIAGNOSIS — C50919 Malignant neoplasm of unspecified site of unspecified female breast: Secondary | ICD-10-CM | POA: Diagnosis not present

## 2019-09-28 DIAGNOSIS — C7951 Secondary malignant neoplasm of bone: Secondary | ICD-10-CM | POA: Diagnosis not present

## 2019-09-28 DIAGNOSIS — M8458XD Pathological fracture in neoplastic disease, other specified site, subsequent encounter for fracture with routine healing: Secondary | ICD-10-CM | POA: Diagnosis not present

## 2019-09-28 DIAGNOSIS — R531 Weakness: Secondary | ICD-10-CM | POA: Diagnosis not present

## 2019-09-28 DIAGNOSIS — Z9010 Acquired absence of unspecified breast and nipple: Secondary | ICD-10-CM | POA: Diagnosis not present

## 2019-09-28 DIAGNOSIS — D630 Anemia in neoplastic disease: Secondary | ICD-10-CM | POA: Diagnosis not present

## 2019-09-28 DIAGNOSIS — C7800 Secondary malignant neoplasm of unspecified lung: Secondary | ICD-10-CM | POA: Diagnosis not present

## 2019-09-29 DIAGNOSIS — C50912 Malignant neoplasm of unspecified site of left female breast: Secondary | ICD-10-CM | POA: Diagnosis not present

## 2019-09-29 DIAGNOSIS — C50919 Malignant neoplasm of unspecified site of unspecified female breast: Secondary | ICD-10-CM | POA: Diagnosis not present

## 2019-09-29 DIAGNOSIS — C7800 Secondary malignant neoplasm of unspecified lung: Secondary | ICD-10-CM | POA: Diagnosis not present

## 2019-09-29 DIAGNOSIS — M8458XD Pathological fracture in neoplastic disease, other specified site, subsequent encounter for fracture with routine healing: Secondary | ICD-10-CM | POA: Diagnosis not present

## 2019-09-29 DIAGNOSIS — Z9010 Acquired absence of unspecified breast and nipple: Secondary | ICD-10-CM | POA: Diagnosis not present

## 2019-09-29 DIAGNOSIS — D630 Anemia in neoplastic disease: Secondary | ICD-10-CM | POA: Diagnosis not present

## 2019-09-29 DIAGNOSIS — C7951 Secondary malignant neoplasm of bone: Secondary | ICD-10-CM | POA: Diagnosis not present

## 2019-10-02 DIAGNOSIS — D630 Anemia in neoplastic disease: Secondary | ICD-10-CM | POA: Diagnosis not present

## 2019-10-02 DIAGNOSIS — M8458XD Pathological fracture in neoplastic disease, other specified site, subsequent encounter for fracture with routine healing: Secondary | ICD-10-CM | POA: Diagnosis not present

## 2019-10-02 DIAGNOSIS — C50919 Malignant neoplasm of unspecified site of unspecified female breast: Secondary | ICD-10-CM | POA: Diagnosis not present

## 2019-10-02 DIAGNOSIS — Z9010 Acquired absence of unspecified breast and nipple: Secondary | ICD-10-CM | POA: Diagnosis not present

## 2019-10-02 DIAGNOSIS — C7951 Secondary malignant neoplasm of bone: Secondary | ICD-10-CM | POA: Diagnosis not present

## 2019-10-02 DIAGNOSIS — C7800 Secondary malignant neoplasm of unspecified lung: Secondary | ICD-10-CM | POA: Diagnosis not present

## 2019-10-03 DIAGNOSIS — D630 Anemia in neoplastic disease: Secondary | ICD-10-CM | POA: Diagnosis not present

## 2019-10-03 DIAGNOSIS — C7800 Secondary malignant neoplasm of unspecified lung: Secondary | ICD-10-CM | POA: Diagnosis not present

## 2019-10-03 DIAGNOSIS — C50812 Malignant neoplasm of overlapping sites of left female breast: Secondary | ICD-10-CM | POA: Diagnosis not present

## 2019-10-03 DIAGNOSIS — Z9010 Acquired absence of unspecified breast and nipple: Secondary | ICD-10-CM | POA: Diagnosis not present

## 2019-10-03 DIAGNOSIS — M8458XD Pathological fracture in neoplastic disease, other specified site, subsequent encounter for fracture with routine healing: Secondary | ICD-10-CM | POA: Diagnosis not present

## 2019-10-03 DIAGNOSIS — C50919 Malignant neoplasm of unspecified site of unspecified female breast: Secondary | ICD-10-CM | POA: Diagnosis not present

## 2019-10-03 DIAGNOSIS — C7951 Secondary malignant neoplasm of bone: Secondary | ICD-10-CM | POA: Diagnosis not present

## 2019-10-04 DIAGNOSIS — C50812 Malignant neoplasm of overlapping sites of left female breast: Secondary | ICD-10-CM | POA: Diagnosis not present

## 2019-10-05 DIAGNOSIS — C7800 Secondary malignant neoplasm of unspecified lung: Secondary | ICD-10-CM | POA: Diagnosis not present

## 2019-10-05 DIAGNOSIS — Z9010 Acquired absence of unspecified breast and nipple: Secondary | ICD-10-CM | POA: Diagnosis not present

## 2019-10-05 DIAGNOSIS — C50812 Malignant neoplasm of overlapping sites of left female breast: Secondary | ICD-10-CM | POA: Diagnosis not present

## 2019-10-05 DIAGNOSIS — D630 Anemia in neoplastic disease: Secondary | ICD-10-CM | POA: Diagnosis not present

## 2019-10-05 DIAGNOSIS — M8458XD Pathological fracture in neoplastic disease, other specified site, subsequent encounter for fracture with routine healing: Secondary | ICD-10-CM | POA: Diagnosis not present

## 2019-10-05 DIAGNOSIS — C50919 Malignant neoplasm of unspecified site of unspecified female breast: Secondary | ICD-10-CM | POA: Diagnosis not present

## 2019-10-05 DIAGNOSIS — C7951 Secondary malignant neoplasm of bone: Secondary | ICD-10-CM | POA: Diagnosis not present

## 2019-10-06 DIAGNOSIS — C50812 Malignant neoplasm of overlapping sites of left female breast: Secondary | ICD-10-CM | POA: Diagnosis not present

## 2019-10-09 DIAGNOSIS — C7800 Secondary malignant neoplasm of unspecified lung: Secondary | ICD-10-CM | POA: Diagnosis not present

## 2019-10-09 DIAGNOSIS — C50812 Malignant neoplasm of overlapping sites of left female breast: Secondary | ICD-10-CM | POA: Diagnosis not present

## 2019-10-09 DIAGNOSIS — Z9010 Acquired absence of unspecified breast and nipple: Secondary | ICD-10-CM | POA: Diagnosis not present

## 2019-10-09 DIAGNOSIS — M8458XD Pathological fracture in neoplastic disease, other specified site, subsequent encounter for fracture with routine healing: Secondary | ICD-10-CM | POA: Diagnosis not present

## 2019-10-09 DIAGNOSIS — C7951 Secondary malignant neoplasm of bone: Secondary | ICD-10-CM | POA: Diagnosis not present

## 2019-10-09 DIAGNOSIS — C50919 Malignant neoplasm of unspecified site of unspecified female breast: Secondary | ICD-10-CM | POA: Diagnosis not present

## 2019-10-09 DIAGNOSIS — D630 Anemia in neoplastic disease: Secondary | ICD-10-CM | POA: Diagnosis not present

## 2019-10-09 DIAGNOSIS — C50912 Malignant neoplasm of unspecified site of left female breast: Secondary | ICD-10-CM | POA: Diagnosis not present

## 2019-10-10 DIAGNOSIS — C50919 Malignant neoplasm of unspecified site of unspecified female breast: Secondary | ICD-10-CM | POA: Diagnosis not present

## 2019-10-10 DIAGNOSIS — Z9010 Acquired absence of unspecified breast and nipple: Secondary | ICD-10-CM | POA: Diagnosis not present

## 2019-10-10 DIAGNOSIS — D630 Anemia in neoplastic disease: Secondary | ICD-10-CM | POA: Diagnosis not present

## 2019-10-10 DIAGNOSIS — C7951 Secondary malignant neoplasm of bone: Secondary | ICD-10-CM | POA: Diagnosis not present

## 2019-10-10 DIAGNOSIS — C50812 Malignant neoplasm of overlapping sites of left female breast: Secondary | ICD-10-CM | POA: Diagnosis not present

## 2019-10-10 DIAGNOSIS — M8458XD Pathological fracture in neoplastic disease, other specified site, subsequent encounter for fracture with routine healing: Secondary | ICD-10-CM | POA: Diagnosis not present

## 2019-10-10 DIAGNOSIS — C7800 Secondary malignant neoplasm of unspecified lung: Secondary | ICD-10-CM | POA: Diagnosis not present

## 2019-10-11 DIAGNOSIS — C50812 Malignant neoplasm of overlapping sites of left female breast: Secondary | ICD-10-CM | POA: Diagnosis not present

## 2019-10-11 DIAGNOSIS — Z170 Estrogen receptor positive status [ER+]: Secondary | ICD-10-CM | POA: Diagnosis not present

## 2019-10-12 DIAGNOSIS — C7951 Secondary malignant neoplasm of bone: Secondary | ICD-10-CM | POA: Diagnosis not present

## 2019-10-12 DIAGNOSIS — C50812 Malignant neoplasm of overlapping sites of left female breast: Secondary | ICD-10-CM | POA: Diagnosis not present

## 2019-10-12 DIAGNOSIS — D630 Anemia in neoplastic disease: Secondary | ICD-10-CM | POA: Diagnosis not present

## 2019-10-12 DIAGNOSIS — C7800 Secondary malignant neoplasm of unspecified lung: Secondary | ICD-10-CM | POA: Diagnosis not present

## 2019-10-12 DIAGNOSIS — M8458XD Pathological fracture in neoplastic disease, other specified site, subsequent encounter for fracture with routine healing: Secondary | ICD-10-CM | POA: Diagnosis not present

## 2019-10-12 DIAGNOSIS — C50919 Malignant neoplasm of unspecified site of unspecified female breast: Secondary | ICD-10-CM | POA: Diagnosis not present

## 2019-10-12 DIAGNOSIS — Z9010 Acquired absence of unspecified breast and nipple: Secondary | ICD-10-CM | POA: Diagnosis not present

## 2019-10-13 DIAGNOSIS — C50812 Malignant neoplasm of overlapping sites of left female breast: Secondary | ICD-10-CM | POA: Diagnosis not present

## 2019-10-14 DIAGNOSIS — C7800 Secondary malignant neoplasm of unspecified lung: Secondary | ICD-10-CM | POA: Diagnosis not present

## 2019-10-14 DIAGNOSIS — Z7901 Long term (current) use of anticoagulants: Secondary | ICD-10-CM | POA: Diagnosis not present

## 2019-10-14 DIAGNOSIS — Z9010 Acquired absence of unspecified breast and nipple: Secondary | ICD-10-CM | POA: Diagnosis not present

## 2019-10-14 DIAGNOSIS — M8458XD Pathological fracture in neoplastic disease, other specified site, subsequent encounter for fracture with routine healing: Secondary | ICD-10-CM | POA: Diagnosis not present

## 2019-10-14 DIAGNOSIS — D630 Anemia in neoplastic disease: Secondary | ICD-10-CM | POA: Diagnosis not present

## 2019-10-14 DIAGNOSIS — C7951 Secondary malignant neoplasm of bone: Secondary | ICD-10-CM | POA: Diagnosis not present

## 2019-10-14 DIAGNOSIS — C50919 Malignant neoplasm of unspecified site of unspecified female breast: Secondary | ICD-10-CM | POA: Diagnosis not present

## 2019-10-16 DIAGNOSIS — C50812 Malignant neoplasm of overlapping sites of left female breast: Secondary | ICD-10-CM | POA: Diagnosis not present

## 2019-10-17 DIAGNOSIS — Z9010 Acquired absence of unspecified breast and nipple: Secondary | ICD-10-CM | POA: Diagnosis not present

## 2019-10-17 DIAGNOSIS — C50919 Malignant neoplasm of unspecified site of unspecified female breast: Secondary | ICD-10-CM | POA: Diagnosis not present

## 2019-10-17 DIAGNOSIS — D630 Anemia in neoplastic disease: Secondary | ICD-10-CM | POA: Diagnosis not present

## 2019-10-17 DIAGNOSIS — C7800 Secondary malignant neoplasm of unspecified lung: Secondary | ICD-10-CM | POA: Diagnosis not present

## 2019-10-17 DIAGNOSIS — M8458XD Pathological fracture in neoplastic disease, other specified site, subsequent encounter for fracture with routine healing: Secondary | ICD-10-CM | POA: Diagnosis not present

## 2019-10-17 DIAGNOSIS — C7951 Secondary malignant neoplasm of bone: Secondary | ICD-10-CM | POA: Diagnosis not present

## 2019-10-18 DIAGNOSIS — K5790 Diverticulosis of intestine, part unspecified, without perforation or abscess without bleeding: Secondary | ICD-10-CM | POA: Diagnosis not present

## 2019-10-18 DIAGNOSIS — C7951 Secondary malignant neoplasm of bone: Secondary | ICD-10-CM | POA: Diagnosis not present

## 2019-10-18 DIAGNOSIS — C7802 Secondary malignant neoplasm of left lung: Secondary | ICD-10-CM | POA: Diagnosis not present

## 2019-10-18 DIAGNOSIS — C7801 Secondary malignant neoplasm of right lung: Secondary | ICD-10-CM | POA: Diagnosis not present

## 2019-10-18 DIAGNOSIS — C50812 Malignant neoplasm of overlapping sites of left female breast: Secondary | ICD-10-CM | POA: Diagnosis not present

## 2019-10-18 DIAGNOSIS — C50912 Malignant neoplasm of unspecified site of left female breast: Secondary | ICD-10-CM | POA: Diagnosis not present

## 2019-10-18 DIAGNOSIS — C771 Secondary and unspecified malignant neoplasm of intrathoracic lymph nodes: Secondary | ICD-10-CM | POA: Diagnosis not present

## 2019-10-19 DIAGNOSIS — C50812 Malignant neoplasm of overlapping sites of left female breast: Secondary | ICD-10-CM | POA: Diagnosis not present

## 2019-10-19 DIAGNOSIS — Z170 Estrogen receptor positive status [ER+]: Secondary | ICD-10-CM | POA: Diagnosis not present

## 2019-10-20 DIAGNOSIS — C771 Secondary and unspecified malignant neoplasm of intrathoracic lymph nodes: Secondary | ICD-10-CM | POA: Diagnosis not present

## 2019-10-20 DIAGNOSIS — C7801 Secondary malignant neoplasm of right lung: Secondary | ICD-10-CM | POA: Diagnosis not present

## 2019-10-20 DIAGNOSIS — C7802 Secondary malignant neoplasm of left lung: Secondary | ICD-10-CM | POA: Diagnosis not present

## 2019-10-20 DIAGNOSIS — C50912 Malignant neoplasm of unspecified site of left female breast: Secondary | ICD-10-CM | POA: Diagnosis not present

## 2019-10-20 DIAGNOSIS — C7951 Secondary malignant neoplasm of bone: Secondary | ICD-10-CM | POA: Diagnosis not present

## 2019-10-20 DIAGNOSIS — R112 Nausea with vomiting, unspecified: Secondary | ICD-10-CM | POA: Diagnosis not present

## 2019-10-21 DIAGNOSIS — C7800 Secondary malignant neoplasm of unspecified lung: Secondary | ICD-10-CM | POA: Diagnosis not present

## 2019-10-21 DIAGNOSIS — C7951 Secondary malignant neoplasm of bone: Secondary | ICD-10-CM | POA: Diagnosis not present

## 2019-10-21 DIAGNOSIS — Z9010 Acquired absence of unspecified breast and nipple: Secondary | ICD-10-CM | POA: Diagnosis not present

## 2019-10-21 DIAGNOSIS — M8458XD Pathological fracture in neoplastic disease, other specified site, subsequent encounter for fracture with routine healing: Secondary | ICD-10-CM | POA: Diagnosis not present

## 2019-10-21 DIAGNOSIS — D630 Anemia in neoplastic disease: Secondary | ICD-10-CM | POA: Diagnosis not present

## 2019-10-21 DIAGNOSIS — C50919 Malignant neoplasm of unspecified site of unspecified female breast: Secondary | ICD-10-CM | POA: Diagnosis not present

## 2019-10-25 DIAGNOSIS — C7951 Secondary malignant neoplasm of bone: Secondary | ICD-10-CM | POA: Diagnosis not present

## 2019-10-25 DIAGNOSIS — Z170 Estrogen receptor positive status [ER+]: Secondary | ICD-10-CM | POA: Diagnosis not present

## 2019-10-27 DIAGNOSIS — C7800 Secondary malignant neoplasm of unspecified lung: Secondary | ICD-10-CM | POA: Diagnosis not present

## 2019-10-27 DIAGNOSIS — C7951 Secondary malignant neoplasm of bone: Secondary | ICD-10-CM | POA: Diagnosis not present

## 2019-10-27 DIAGNOSIS — M8458XD Pathological fracture in neoplastic disease, other specified site, subsequent encounter for fracture with routine healing: Secondary | ICD-10-CM | POA: Diagnosis not present

## 2019-10-27 DIAGNOSIS — C50812 Malignant neoplasm of overlapping sites of left female breast: Secondary | ICD-10-CM | POA: Diagnosis not present

## 2019-10-27 DIAGNOSIS — C50919 Malignant neoplasm of unspecified site of unspecified female breast: Secondary | ICD-10-CM | POA: Diagnosis not present

## 2019-10-27 DIAGNOSIS — D630 Anemia in neoplastic disease: Secondary | ICD-10-CM | POA: Diagnosis not present

## 2019-10-27 DIAGNOSIS — Z9010 Acquired absence of unspecified breast and nipple: Secondary | ICD-10-CM | POA: Diagnosis not present

## 2019-10-31 DIAGNOSIS — C50812 Malignant neoplasm of overlapping sites of left female breast: Secondary | ICD-10-CM | POA: Diagnosis not present

## 2019-11-01 DIAGNOSIS — C50812 Malignant neoplasm of overlapping sites of left female breast: Secondary | ICD-10-CM | POA: Diagnosis not present

## 2019-11-02 DIAGNOSIS — C50812 Malignant neoplasm of overlapping sites of left female breast: Secondary | ICD-10-CM | POA: Diagnosis not present

## 2019-11-03 DIAGNOSIS — C50812 Malignant neoplasm of overlapping sites of left female breast: Secondary | ICD-10-CM | POA: Diagnosis not present

## 2019-11-04 DIAGNOSIS — Z9010 Acquired absence of unspecified breast and nipple: Secondary | ICD-10-CM | POA: Diagnosis not present

## 2019-11-04 DIAGNOSIS — C7951 Secondary malignant neoplasm of bone: Secondary | ICD-10-CM | POA: Diagnosis not present

## 2019-11-04 DIAGNOSIS — M8458XD Pathological fracture in neoplastic disease, other specified site, subsequent encounter for fracture with routine healing: Secondary | ICD-10-CM | POA: Diagnosis not present

## 2019-11-04 DIAGNOSIS — C7800 Secondary malignant neoplasm of unspecified lung: Secondary | ICD-10-CM | POA: Diagnosis not present

## 2019-11-04 DIAGNOSIS — D630 Anemia in neoplastic disease: Secondary | ICD-10-CM | POA: Diagnosis not present

## 2019-11-04 DIAGNOSIS — C50919 Malignant neoplasm of unspecified site of unspecified female breast: Secondary | ICD-10-CM | POA: Diagnosis not present

## 2019-11-06 DIAGNOSIS — C50812 Malignant neoplasm of overlapping sites of left female breast: Secondary | ICD-10-CM | POA: Diagnosis not present

## 2019-11-07 DIAGNOSIS — C50812 Malignant neoplasm of overlapping sites of left female breast: Secondary | ICD-10-CM | POA: Diagnosis not present

## 2019-11-08 DIAGNOSIS — C50812 Malignant neoplasm of overlapping sites of left female breast: Secondary | ICD-10-CM | POA: Diagnosis not present

## 2019-11-09 DIAGNOSIS — C7951 Secondary malignant neoplasm of bone: Secondary | ICD-10-CM | POA: Diagnosis not present

## 2019-11-09 DIAGNOSIS — D630 Anemia in neoplastic disease: Secondary | ICD-10-CM | POA: Diagnosis not present

## 2019-11-09 DIAGNOSIS — C50812 Malignant neoplasm of overlapping sites of left female breast: Secondary | ICD-10-CM | POA: Diagnosis not present

## 2019-11-09 DIAGNOSIS — C50919 Malignant neoplasm of unspecified site of unspecified female breast: Secondary | ICD-10-CM | POA: Diagnosis not present

## 2019-11-09 DIAGNOSIS — M8458XD Pathological fracture in neoplastic disease, other specified site, subsequent encounter for fracture with routine healing: Secondary | ICD-10-CM | POA: Diagnosis not present

## 2019-11-09 DIAGNOSIS — C7800 Secondary malignant neoplasm of unspecified lung: Secondary | ICD-10-CM | POA: Diagnosis not present

## 2019-11-09 DIAGNOSIS — Z9010 Acquired absence of unspecified breast and nipple: Secondary | ICD-10-CM | POA: Diagnosis not present

## 2019-11-10 DIAGNOSIS — Z4789 Encounter for other orthopedic aftercare: Secondary | ICD-10-CM | POA: Diagnosis not present

## 2019-11-10 DIAGNOSIS — C50812 Malignant neoplasm of overlapping sites of left female breast: Secondary | ICD-10-CM | POA: Diagnosis not present

## 2019-11-13 DIAGNOSIS — M8458XD Pathological fracture in neoplastic disease, other specified site, subsequent encounter for fracture with routine healing: Secondary | ICD-10-CM | POA: Diagnosis not present

## 2019-11-14 DIAGNOSIS — C50812 Malignant neoplasm of overlapping sites of left female breast: Secondary | ICD-10-CM | POA: Diagnosis not present

## 2019-11-15 DIAGNOSIS — C50812 Malignant neoplasm of overlapping sites of left female breast: Secondary | ICD-10-CM | POA: Diagnosis not present

## 2019-11-17 DIAGNOSIS — C7802 Secondary malignant neoplasm of left lung: Secondary | ICD-10-CM | POA: Diagnosis not present

## 2019-11-17 DIAGNOSIS — C7801 Secondary malignant neoplasm of right lung: Secondary | ICD-10-CM | POA: Diagnosis not present

## 2019-11-17 DIAGNOSIS — C50912 Malignant neoplasm of unspecified site of left female breast: Secondary | ICD-10-CM | POA: Diagnosis not present

## 2019-11-17 DIAGNOSIS — C771 Secondary and unspecified malignant neoplasm of intrathoracic lymph nodes: Secondary | ICD-10-CM | POA: Diagnosis not present

## 2019-11-17 DIAGNOSIS — R112 Nausea with vomiting, unspecified: Secondary | ICD-10-CM | POA: Diagnosis not present

## 2019-11-17 DIAGNOSIS — C50919 Malignant neoplasm of unspecified site of unspecified female breast: Secondary | ICD-10-CM | POA: Diagnosis not present

## 2019-11-17 DIAGNOSIS — C7951 Secondary malignant neoplasm of bone: Secondary | ICD-10-CM | POA: Diagnosis not present

## 2019-11-20 DIAGNOSIS — C7951 Secondary malignant neoplasm of bone: Secondary | ICD-10-CM | POA: Diagnosis not present

## 2019-11-20 DIAGNOSIS — C7802 Secondary malignant neoplasm of left lung: Secondary | ICD-10-CM | POA: Diagnosis not present

## 2019-11-20 DIAGNOSIS — C50912 Malignant neoplasm of unspecified site of left female breast: Secondary | ICD-10-CM | POA: Diagnosis not present

## 2019-11-20 DIAGNOSIS — C7801 Secondary malignant neoplasm of right lung: Secondary | ICD-10-CM | POA: Diagnosis not present

## 2019-11-28 DIAGNOSIS — C7951 Secondary malignant neoplasm of bone: Secondary | ICD-10-CM | POA: Diagnosis not present

## 2019-11-28 DIAGNOSIS — C50912 Malignant neoplasm of unspecified site of left female breast: Secondary | ICD-10-CM | POA: Diagnosis not present

## 2019-11-28 DIAGNOSIS — C7801 Secondary malignant neoplasm of right lung: Secondary | ICD-10-CM | POA: Diagnosis not present

## 2019-11-28 DIAGNOSIS — D701 Agranulocytosis secondary to cancer chemotherapy: Secondary | ICD-10-CM | POA: Diagnosis not present

## 2019-11-28 DIAGNOSIS — C7802 Secondary malignant neoplasm of left lung: Secondary | ICD-10-CM | POA: Diagnosis not present

## 2019-11-28 DIAGNOSIS — C50919 Malignant neoplasm of unspecified site of unspecified female breast: Secondary | ICD-10-CM | POA: Diagnosis not present

## 2019-11-28 DIAGNOSIS — F411 Generalized anxiety disorder: Secondary | ICD-10-CM | POA: Diagnosis not present

## 2019-12-13 DIAGNOSIS — C7951 Secondary malignant neoplasm of bone: Secondary | ICD-10-CM | POA: Diagnosis not present

## 2019-12-13 DIAGNOSIS — Z9181 History of falling: Secondary | ICD-10-CM | POA: Diagnosis not present

## 2019-12-13 DIAGNOSIS — M8458XD Pathological fracture in neoplastic disease, other specified site, subsequent encounter for fracture with routine healing: Secondary | ICD-10-CM | POA: Diagnosis not present

## 2019-12-13 DIAGNOSIS — C7800 Secondary malignant neoplasm of unspecified lung: Secondary | ICD-10-CM | POA: Diagnosis not present

## 2019-12-13 DIAGNOSIS — Z9010 Acquired absence of unspecified breast and nipple: Secondary | ICD-10-CM | POA: Diagnosis not present

## 2019-12-13 DIAGNOSIS — D630 Anemia in neoplastic disease: Secondary | ICD-10-CM | POA: Diagnosis not present

## 2019-12-13 DIAGNOSIS — C50919 Malignant neoplasm of unspecified site of unspecified female breast: Secondary | ICD-10-CM | POA: Diagnosis not present

## 2019-12-13 DIAGNOSIS — Z7901 Long term (current) use of anticoagulants: Secondary | ICD-10-CM | POA: Diagnosis not present

## 2019-12-19 DIAGNOSIS — C50919 Malignant neoplasm of unspecified site of unspecified female breast: Secondary | ICD-10-CM | POA: Diagnosis not present

## 2019-12-19 DIAGNOSIS — C7800 Secondary malignant neoplasm of unspecified lung: Secondary | ICD-10-CM | POA: Diagnosis not present

## 2019-12-19 DIAGNOSIS — M8458XD Pathological fracture in neoplastic disease, other specified site, subsequent encounter for fracture with routine healing: Secondary | ICD-10-CM | POA: Diagnosis not present

## 2019-12-19 DIAGNOSIS — C7951 Secondary malignant neoplasm of bone: Secondary | ICD-10-CM | POA: Diagnosis not present

## 2019-12-19 DIAGNOSIS — Z9010 Acquired absence of unspecified breast and nipple: Secondary | ICD-10-CM | POA: Diagnosis not present

## 2019-12-19 DIAGNOSIS — D630 Anemia in neoplastic disease: Secondary | ICD-10-CM | POA: Diagnosis not present

## 2019-12-20 DIAGNOSIS — M8458XD Pathological fracture in neoplastic disease, other specified site, subsequent encounter for fracture with routine healing: Secondary | ICD-10-CM | POA: Diagnosis not present

## 2019-12-20 DIAGNOSIS — C7800 Secondary malignant neoplasm of unspecified lung: Secondary | ICD-10-CM | POA: Diagnosis not present

## 2019-12-20 DIAGNOSIS — C50919 Malignant neoplasm of unspecified site of unspecified female breast: Secondary | ICD-10-CM | POA: Diagnosis not present

## 2019-12-20 DIAGNOSIS — D630 Anemia in neoplastic disease: Secondary | ICD-10-CM | POA: Diagnosis not present

## 2019-12-20 DIAGNOSIS — Z9010 Acquired absence of unspecified breast and nipple: Secondary | ICD-10-CM | POA: Diagnosis not present

## 2019-12-20 DIAGNOSIS — C50912 Malignant neoplasm of unspecified site of left female breast: Secondary | ICD-10-CM | POA: Diagnosis not present

## 2019-12-20 DIAGNOSIS — C7951 Secondary malignant neoplasm of bone: Secondary | ICD-10-CM | POA: Diagnosis not present

## 2019-12-20 DIAGNOSIS — C7801 Secondary malignant neoplasm of right lung: Secondary | ICD-10-CM | POA: Diagnosis not present

## 2019-12-20 DIAGNOSIS — C7802 Secondary malignant neoplasm of left lung: Secondary | ICD-10-CM | POA: Diagnosis not present

## 2019-12-28 DIAGNOSIS — D759 Disease of blood and blood-forming organs, unspecified: Secondary | ICD-10-CM | POA: Diagnosis not present

## 2019-12-28 DIAGNOSIS — F411 Generalized anxiety disorder: Secondary | ICD-10-CM | POA: Diagnosis not present

## 2019-12-28 DIAGNOSIS — C50919 Malignant neoplasm of unspecified site of unspecified female breast: Secondary | ICD-10-CM | POA: Diagnosis not present

## 2019-12-28 DIAGNOSIS — C50912 Malignant neoplasm of unspecified site of left female breast: Secondary | ICD-10-CM | POA: Diagnosis not present

## 2019-12-28 DIAGNOSIS — C7802 Secondary malignant neoplasm of left lung: Secondary | ICD-10-CM | POA: Diagnosis not present

## 2019-12-28 DIAGNOSIS — C7951 Secondary malignant neoplasm of bone: Secondary | ICD-10-CM | POA: Diagnosis not present

## 2019-12-28 DIAGNOSIS — C771 Secondary and unspecified malignant neoplasm of intrathoracic lymph nodes: Secondary | ICD-10-CM | POA: Diagnosis not present

## 2019-12-28 DIAGNOSIS — C7801 Secondary malignant neoplasm of right lung: Secondary | ICD-10-CM | POA: Diagnosis not present

## 2019-12-29 DIAGNOSIS — Z5111 Encounter for antineoplastic chemotherapy: Secondary | ICD-10-CM | POA: Diagnosis not present

## 2019-12-29 DIAGNOSIS — C50912 Malignant neoplasm of unspecified site of left female breast: Secondary | ICD-10-CM | POA: Diagnosis not present

## 2019-12-29 DIAGNOSIS — R112 Nausea with vomiting, unspecified: Secondary | ICD-10-CM | POA: Diagnosis not present

## 2020-01-01 DIAGNOSIS — C7800 Secondary malignant neoplasm of unspecified lung: Secondary | ICD-10-CM | POA: Diagnosis not present

## 2020-01-01 DIAGNOSIS — D630 Anemia in neoplastic disease: Secondary | ICD-10-CM | POA: Diagnosis not present

## 2020-01-01 DIAGNOSIS — C50919 Malignant neoplasm of unspecified site of unspecified female breast: Secondary | ICD-10-CM | POA: Diagnosis not present

## 2020-01-01 DIAGNOSIS — Z9010 Acquired absence of unspecified breast and nipple: Secondary | ICD-10-CM | POA: Diagnosis not present

## 2020-01-01 DIAGNOSIS — C7951 Secondary malignant neoplasm of bone: Secondary | ICD-10-CM | POA: Diagnosis not present

## 2020-01-01 DIAGNOSIS — M8458XD Pathological fracture in neoplastic disease, other specified site, subsequent encounter for fracture with routine healing: Secondary | ICD-10-CM | POA: Diagnosis not present

## 2020-01-04 DIAGNOSIS — C50919 Malignant neoplasm of unspecified site of unspecified female breast: Secondary | ICD-10-CM | POA: Diagnosis not present

## 2020-01-04 DIAGNOSIS — C7951 Secondary malignant neoplasm of bone: Secondary | ICD-10-CM | POA: Diagnosis not present

## 2020-01-04 DIAGNOSIS — D630 Anemia in neoplastic disease: Secondary | ICD-10-CM | POA: Diagnosis not present

## 2020-01-04 DIAGNOSIS — Z9010 Acquired absence of unspecified breast and nipple: Secondary | ICD-10-CM | POA: Diagnosis not present

## 2020-01-04 DIAGNOSIS — C7800 Secondary malignant neoplasm of unspecified lung: Secondary | ICD-10-CM | POA: Diagnosis not present

## 2020-01-04 DIAGNOSIS — M8458XD Pathological fracture in neoplastic disease, other specified site, subsequent encounter for fracture with routine healing: Secondary | ICD-10-CM | POA: Diagnosis not present

## 2020-01-08 DIAGNOSIS — C771 Secondary and unspecified malignant neoplasm of intrathoracic lymph nodes: Secondary | ICD-10-CM | POA: Diagnosis not present

## 2020-01-08 DIAGNOSIS — C50919 Malignant neoplasm of unspecified site of unspecified female breast: Secondary | ICD-10-CM | POA: Diagnosis not present

## 2020-01-08 DIAGNOSIS — C7951 Secondary malignant neoplasm of bone: Secondary | ICD-10-CM | POA: Diagnosis not present

## 2020-01-08 DIAGNOSIS — C7801 Secondary malignant neoplasm of right lung: Secondary | ICD-10-CM | POA: Diagnosis not present

## 2020-01-08 DIAGNOSIS — R52 Pain, unspecified: Secondary | ICD-10-CM | POA: Diagnosis not present

## 2020-01-08 DIAGNOSIS — M542 Cervicalgia: Secondary | ICD-10-CM | POA: Diagnosis not present

## 2020-01-08 DIAGNOSIS — C50912 Malignant neoplasm of unspecified site of left female breast: Secondary | ICD-10-CM | POA: Diagnosis not present

## 2020-01-09 DIAGNOSIS — C782 Secondary malignant neoplasm of pleura: Secondary | ICD-10-CM | POA: Diagnosis not present

## 2020-01-09 DIAGNOSIS — C7802 Secondary malignant neoplasm of left lung: Secondary | ICD-10-CM | POA: Diagnosis not present

## 2020-01-09 DIAGNOSIS — R079 Chest pain, unspecified: Secondary | ICD-10-CM | POA: Diagnosis not present

## 2020-01-09 DIAGNOSIS — C50919 Malignant neoplasm of unspecified site of unspecified female breast: Secondary | ICD-10-CM | POA: Diagnosis not present

## 2020-01-09 DIAGNOSIS — J90 Pleural effusion, not elsewhere classified: Secondary | ICD-10-CM | POA: Diagnosis not present

## 2020-01-09 DIAGNOSIS — J910 Malignant pleural effusion: Secondary | ICD-10-CM | POA: Diagnosis not present

## 2020-01-09 DIAGNOSIS — C771 Secondary and unspecified malignant neoplasm of intrathoracic lymph nodes: Secondary | ICD-10-CM | POA: Diagnosis not present

## 2020-01-09 DIAGNOSIS — R911 Solitary pulmonary nodule: Secondary | ICD-10-CM | POA: Diagnosis not present

## 2020-01-09 DIAGNOSIS — C7801 Secondary malignant neoplasm of right lung: Secondary | ICD-10-CM | POA: Diagnosis not present

## 2020-01-09 DIAGNOSIS — C50912 Malignant neoplasm of unspecified site of left female breast: Secondary | ICD-10-CM | POA: Diagnosis not present

## 2020-01-10 DIAGNOSIS — C7951 Secondary malignant neoplasm of bone: Secondary | ICD-10-CM | POA: Diagnosis not present

## 2020-01-10 DIAGNOSIS — Z9010 Acquired absence of unspecified breast and nipple: Secondary | ICD-10-CM | POA: Diagnosis not present

## 2020-01-10 DIAGNOSIS — C7800 Secondary malignant neoplasm of unspecified lung: Secondary | ICD-10-CM | POA: Diagnosis not present

## 2020-01-10 DIAGNOSIS — C50919 Malignant neoplasm of unspecified site of unspecified female breast: Secondary | ICD-10-CM | POA: Diagnosis not present

## 2020-01-10 DIAGNOSIS — M8458XD Pathological fracture in neoplastic disease, other specified site, subsequent encounter for fracture with routine healing: Secondary | ICD-10-CM | POA: Diagnosis not present

## 2020-01-10 DIAGNOSIS — D630 Anemia in neoplastic disease: Secondary | ICD-10-CM | POA: Diagnosis not present

## 2020-01-11 DIAGNOSIS — C7951 Secondary malignant neoplasm of bone: Secondary | ICD-10-CM | POA: Diagnosis not present

## 2020-01-11 DIAGNOSIS — C50919 Malignant neoplasm of unspecified site of unspecified female breast: Secondary | ICD-10-CM | POA: Diagnosis not present

## 2020-01-11 DIAGNOSIS — M8458XD Pathological fracture in neoplastic disease, other specified site, subsequent encounter for fracture with routine healing: Secondary | ICD-10-CM | POA: Diagnosis not present

## 2020-01-11 DIAGNOSIS — C7800 Secondary malignant neoplasm of unspecified lung: Secondary | ICD-10-CM | POA: Diagnosis not present

## 2020-01-11 DIAGNOSIS — Z9010 Acquired absence of unspecified breast and nipple: Secondary | ICD-10-CM | POA: Diagnosis not present

## 2020-01-11 DIAGNOSIS — D630 Anemia in neoplastic disease: Secondary | ICD-10-CM | POA: Diagnosis not present

## 2020-01-12 DIAGNOSIS — M8458XD Pathological fracture in neoplastic disease, other specified site, subsequent encounter for fracture with routine healing: Secondary | ICD-10-CM | POA: Diagnosis not present

## 2020-01-12 DIAGNOSIS — D630 Anemia in neoplastic disease: Secondary | ICD-10-CM | POA: Diagnosis not present

## 2020-01-12 DIAGNOSIS — C7801 Secondary malignant neoplasm of right lung: Secondary | ICD-10-CM | POA: Diagnosis not present

## 2020-01-12 DIAGNOSIS — C50919 Malignant neoplasm of unspecified site of unspecified female breast: Secondary | ICD-10-CM | POA: Diagnosis not present

## 2020-01-12 DIAGNOSIS — Z9181 History of falling: Secondary | ICD-10-CM | POA: Diagnosis not present

## 2020-01-12 DIAGNOSIS — C7951 Secondary malignant neoplasm of bone: Secondary | ICD-10-CM | POA: Diagnosis not present

## 2020-01-12 DIAGNOSIS — Z9010 Acquired absence of unspecified breast and nipple: Secondary | ICD-10-CM | POA: Diagnosis not present

## 2020-01-14 DIAGNOSIS — C50912 Malignant neoplasm of unspecified site of left female breast: Secondary | ICD-10-CM | POA: Diagnosis not present

## 2020-01-14 DIAGNOSIS — C7801 Secondary malignant neoplasm of right lung: Secondary | ICD-10-CM | POA: Diagnosis not present

## 2020-01-14 DIAGNOSIS — C7802 Secondary malignant neoplasm of left lung: Secondary | ICD-10-CM | POA: Diagnosis not present

## 2020-01-14 DIAGNOSIS — C7951 Secondary malignant neoplasm of bone: Secondary | ICD-10-CM | POA: Diagnosis not present

## 2020-01-16 DIAGNOSIS — C7951 Secondary malignant neoplasm of bone: Secondary | ICD-10-CM | POA: Diagnosis not present

## 2020-01-16 DIAGNOSIS — M8458XD Pathological fracture in neoplastic disease, other specified site, subsequent encounter for fracture with routine healing: Secondary | ICD-10-CM | POA: Diagnosis not present

## 2020-01-16 DIAGNOSIS — D630 Anemia in neoplastic disease: Secondary | ICD-10-CM | POA: Diagnosis not present

## 2020-01-16 DIAGNOSIS — C7801 Secondary malignant neoplasm of right lung: Secondary | ICD-10-CM | POA: Diagnosis not present

## 2020-01-16 DIAGNOSIS — C50919 Malignant neoplasm of unspecified site of unspecified female breast: Secondary | ICD-10-CM | POA: Diagnosis not present

## 2020-01-16 DIAGNOSIS — Z9010 Acquired absence of unspecified breast and nipple: Secondary | ICD-10-CM | POA: Diagnosis not present

## 2020-01-18 DIAGNOSIS — J90 Pleural effusion, not elsewhere classified: Secondary | ICD-10-CM | POA: Diagnosis not present

## 2020-01-18 DIAGNOSIS — C50912 Malignant neoplasm of unspecified site of left female breast: Secondary | ICD-10-CM | POA: Diagnosis not present

## 2020-01-18 DIAGNOSIS — C771 Secondary and unspecified malignant neoplasm of intrathoracic lymph nodes: Secondary | ICD-10-CM | POA: Diagnosis not present

## 2020-01-18 DIAGNOSIS — R918 Other nonspecific abnormal finding of lung field: Secondary | ICD-10-CM | POA: Diagnosis not present

## 2020-01-18 DIAGNOSIS — C7802 Secondary malignant neoplasm of left lung: Secondary | ICD-10-CM | POA: Diagnosis not present

## 2020-01-19 DIAGNOSIS — C7800 Secondary malignant neoplasm of unspecified lung: Secondary | ICD-10-CM | POA: Diagnosis not present

## 2020-01-19 DIAGNOSIS — C50919 Malignant neoplasm of unspecified site of unspecified female breast: Secondary | ICD-10-CM | POA: Diagnosis not present

## 2020-01-19 DIAGNOSIS — J910 Malignant pleural effusion: Secondary | ICD-10-CM | POA: Diagnosis not present

## 2020-01-20 DIAGNOSIS — C7951 Secondary malignant neoplasm of bone: Secondary | ICD-10-CM | POA: Diagnosis not present

## 2020-01-20 DIAGNOSIS — C7802 Secondary malignant neoplasm of left lung: Secondary | ICD-10-CM | POA: Diagnosis not present

## 2020-01-20 DIAGNOSIS — C50912 Malignant neoplasm of unspecified site of left female breast: Secondary | ICD-10-CM | POA: Diagnosis not present

## 2020-01-20 DIAGNOSIS — C7801 Secondary malignant neoplasm of right lung: Secondary | ICD-10-CM | POA: Diagnosis not present

## 2020-01-22 DIAGNOSIS — C50912 Malignant neoplasm of unspecified site of left female breast: Secondary | ICD-10-CM | POA: Diagnosis not present

## 2020-01-22 DIAGNOSIS — C7951 Secondary malignant neoplasm of bone: Secondary | ICD-10-CM | POA: Diagnosis not present

## 2020-01-22 DIAGNOSIS — C7802 Secondary malignant neoplasm of left lung: Secondary | ICD-10-CM | POA: Diagnosis not present

## 2020-01-22 DIAGNOSIS — J90 Pleural effusion, not elsewhere classified: Secondary | ICD-10-CM | POA: Diagnosis not present

## 2020-01-22 DIAGNOSIS — C771 Secondary and unspecified malignant neoplasm of intrathoracic lymph nodes: Secondary | ICD-10-CM | POA: Diagnosis not present

## 2020-01-23 DIAGNOSIS — C771 Secondary and unspecified malignant neoplasm of intrathoracic lymph nodes: Secondary | ICD-10-CM | POA: Diagnosis not present

## 2020-01-23 DIAGNOSIS — C50912 Malignant neoplasm of unspecified site of left female breast: Secondary | ICD-10-CM | POA: Diagnosis not present

## 2020-01-23 DIAGNOSIS — C7802 Secondary malignant neoplasm of left lung: Secondary | ICD-10-CM | POA: Diagnosis not present

## 2020-01-23 DIAGNOSIS — C7951 Secondary malignant neoplasm of bone: Secondary | ICD-10-CM | POA: Diagnosis not present

## 2020-01-24 DIAGNOSIS — C771 Secondary and unspecified malignant neoplasm of intrathoracic lymph nodes: Secondary | ICD-10-CM | POA: Diagnosis not present

## 2020-01-24 DIAGNOSIS — D630 Anemia in neoplastic disease: Secondary | ICD-10-CM | POA: Diagnosis not present

## 2020-01-24 DIAGNOSIS — C50912 Malignant neoplasm of unspecified site of left female breast: Secondary | ICD-10-CM | POA: Diagnosis not present

## 2020-01-24 DIAGNOSIS — Z9010 Acquired absence of unspecified breast and nipple: Secondary | ICD-10-CM | POA: Diagnosis not present

## 2020-01-24 DIAGNOSIS — C7951 Secondary malignant neoplasm of bone: Secondary | ICD-10-CM | POA: Diagnosis not present

## 2020-01-24 DIAGNOSIS — C7802 Secondary malignant neoplasm of left lung: Secondary | ICD-10-CM | POA: Diagnosis not present

## 2020-01-24 DIAGNOSIS — C50919 Malignant neoplasm of unspecified site of unspecified female breast: Secondary | ICD-10-CM | POA: Diagnosis not present

## 2020-01-24 DIAGNOSIS — C7801 Secondary malignant neoplasm of right lung: Secondary | ICD-10-CM | POA: Diagnosis not present

## 2020-01-24 DIAGNOSIS — M8458XD Pathological fracture in neoplastic disease, other specified site, subsequent encounter for fracture with routine healing: Secondary | ICD-10-CM | POA: Diagnosis not present

## 2020-01-25 DIAGNOSIS — C7951 Secondary malignant neoplasm of bone: Secondary | ICD-10-CM | POA: Diagnosis not present

## 2020-01-25 DIAGNOSIS — C7802 Secondary malignant neoplasm of left lung: Secondary | ICD-10-CM | POA: Diagnosis not present

## 2020-01-25 DIAGNOSIS — C50912 Malignant neoplasm of unspecified site of left female breast: Secondary | ICD-10-CM | POA: Diagnosis not present

## 2020-01-25 DIAGNOSIS — C771 Secondary and unspecified malignant neoplasm of intrathoracic lymph nodes: Secondary | ICD-10-CM | POA: Diagnosis not present

## 2020-01-29 DIAGNOSIS — C7951 Secondary malignant neoplasm of bone: Secondary | ICD-10-CM | POA: Diagnosis not present

## 2020-01-29 DIAGNOSIS — C771 Secondary and unspecified malignant neoplasm of intrathoracic lymph nodes: Secondary | ICD-10-CM | POA: Diagnosis not present

## 2020-01-29 DIAGNOSIS — C50912 Malignant neoplasm of unspecified site of left female breast: Secondary | ICD-10-CM | POA: Diagnosis not present

## 2020-01-29 DIAGNOSIS — C7802 Secondary malignant neoplasm of left lung: Secondary | ICD-10-CM | POA: Diagnosis not present

## 2020-02-05 DIAGNOSIS — C7802 Secondary malignant neoplasm of left lung: Secondary | ICD-10-CM | POA: Diagnosis not present

## 2020-02-05 DIAGNOSIS — C771 Secondary and unspecified malignant neoplasm of intrathoracic lymph nodes: Secondary | ICD-10-CM | POA: Diagnosis not present

## 2020-02-05 DIAGNOSIS — C7951 Secondary malignant neoplasm of bone: Secondary | ICD-10-CM | POA: Diagnosis not present

## 2020-02-05 DIAGNOSIS — C50912 Malignant neoplasm of unspecified site of left female breast: Secondary | ICD-10-CM | POA: Diagnosis not present

## 2020-02-06 DIAGNOSIS — C7802 Secondary malignant neoplasm of left lung: Secondary | ICD-10-CM | POA: Diagnosis not present

## 2020-02-06 DIAGNOSIS — C7951 Secondary malignant neoplasm of bone: Secondary | ICD-10-CM | POA: Diagnosis not present

## 2020-02-06 DIAGNOSIS — C771 Secondary and unspecified malignant neoplasm of intrathoracic lymph nodes: Secondary | ICD-10-CM | POA: Diagnosis not present

## 2020-02-06 DIAGNOSIS — C50912 Malignant neoplasm of unspecified site of left female breast: Secondary | ICD-10-CM | POA: Diagnosis not present

## 2020-02-07 DIAGNOSIS — J90 Pleural effusion, not elsewhere classified: Secondary | ICD-10-CM | POA: Diagnosis not present

## 2020-02-07 DIAGNOSIS — C3490 Malignant neoplasm of unspecified part of unspecified bronchus or lung: Secondary | ICD-10-CM | POA: Diagnosis not present

## 2020-02-07 DIAGNOSIS — Z9181 History of falling: Secondary | ICD-10-CM | POA: Diagnosis not present

## 2020-02-07 DIAGNOSIS — C7951 Secondary malignant neoplasm of bone: Secondary | ICD-10-CM | POA: Diagnosis not present

## 2020-02-07 DIAGNOSIS — C7802 Secondary malignant neoplasm of left lung: Secondary | ICD-10-CM | POA: Diagnosis not present

## 2020-02-07 DIAGNOSIS — C771 Secondary and unspecified malignant neoplasm of intrathoracic lymph nodes: Secondary | ICD-10-CM | POA: Diagnosis not present

## 2020-02-07 DIAGNOSIS — C50912 Malignant neoplasm of unspecified site of left female breast: Secondary | ICD-10-CM | POA: Diagnosis not present

## 2020-02-08 DIAGNOSIS — C7951 Secondary malignant neoplasm of bone: Secondary | ICD-10-CM | POA: Diagnosis not present

## 2020-02-08 DIAGNOSIS — C771 Secondary and unspecified malignant neoplasm of intrathoracic lymph nodes: Secondary | ICD-10-CM | POA: Diagnosis not present

## 2020-02-08 DIAGNOSIS — C50912 Malignant neoplasm of unspecified site of left female breast: Secondary | ICD-10-CM | POA: Diagnosis not present

## 2020-02-08 DIAGNOSIS — C7802 Secondary malignant neoplasm of left lung: Secondary | ICD-10-CM | POA: Diagnosis not present

## 2020-02-10 DIAGNOSIS — C7802 Secondary malignant neoplasm of left lung: Secondary | ICD-10-CM | POA: Diagnosis not present

## 2020-02-10 DIAGNOSIS — C7951 Secondary malignant neoplasm of bone: Secondary | ICD-10-CM | POA: Diagnosis not present

## 2020-02-10 DIAGNOSIS — C771 Secondary and unspecified malignant neoplasm of intrathoracic lymph nodes: Secondary | ICD-10-CM | POA: Diagnosis not present

## 2020-02-10 DIAGNOSIS — C50912 Malignant neoplasm of unspecified site of left female breast: Secondary | ICD-10-CM | POA: Diagnosis not present

## 2020-02-12 DIAGNOSIS — R1310 Dysphagia, unspecified: Secondary | ICD-10-CM | POA: Diagnosis not present

## 2020-02-12 DIAGNOSIS — E860 Dehydration: Secondary | ICD-10-CM | POA: Diagnosis not present

## 2020-02-12 DIAGNOSIS — C50912 Malignant neoplasm of unspecified site of left female breast: Secondary | ICD-10-CM | POA: Diagnosis not present

## 2020-02-12 DIAGNOSIS — C50919 Malignant neoplasm of unspecified site of unspecified female breast: Secondary | ICD-10-CM | POA: Diagnosis not present

## 2020-02-12 DIAGNOSIS — J910 Malignant pleural effusion: Secondary | ICD-10-CM | POA: Diagnosis not present

## 2020-02-13 DIAGNOSIS — C50912 Malignant neoplasm of unspecified site of left female breast: Secondary | ICD-10-CM | POA: Diagnosis not present

## 2020-02-13 DIAGNOSIS — C7801 Secondary malignant neoplasm of right lung: Secondary | ICD-10-CM | POA: Diagnosis not present

## 2020-02-13 DIAGNOSIS — C7951 Secondary malignant neoplasm of bone: Secondary | ICD-10-CM | POA: Diagnosis not present

## 2020-02-13 DIAGNOSIS — E860 Dehydration: Secondary | ICD-10-CM | POA: Diagnosis not present

## 2020-02-14 DIAGNOSIS — C50912 Malignant neoplasm of unspecified site of left female breast: Secondary | ICD-10-CM | POA: Diagnosis not present

## 2020-02-14 DIAGNOSIS — C7802 Secondary malignant neoplasm of left lung: Secondary | ICD-10-CM | POA: Diagnosis not present

## 2020-02-14 DIAGNOSIS — C7801 Secondary malignant neoplasm of right lung: Secondary | ICD-10-CM | POA: Diagnosis not present

## 2020-02-14 DIAGNOSIS — E860 Dehydration: Secondary | ICD-10-CM | POA: Diagnosis not present

## 2020-02-14 DIAGNOSIS — C7951 Secondary malignant neoplasm of bone: Secondary | ICD-10-CM | POA: Diagnosis not present

## 2020-02-15 DIAGNOSIS — C50912 Malignant neoplasm of unspecified site of left female breast: Secondary | ICD-10-CM | POA: Diagnosis not present

## 2020-02-15 DIAGNOSIS — E860 Dehydration: Secondary | ICD-10-CM | POA: Diagnosis not present

## 2020-02-16 DIAGNOSIS — C50912 Malignant neoplasm of unspecified site of left female breast: Secondary | ICD-10-CM | POA: Diagnosis not present

## 2020-02-16 DIAGNOSIS — D630 Anemia in neoplastic disease: Secondary | ICD-10-CM | POA: Diagnosis not present

## 2020-02-16 DIAGNOSIS — F418 Other specified anxiety disorders: Secondary | ICD-10-CM | POA: Diagnosis not present

## 2020-02-16 DIAGNOSIS — C7802 Secondary malignant neoplasm of left lung: Secondary | ICD-10-CM | POA: Diagnosis not present

## 2020-02-16 DIAGNOSIS — C7801 Secondary malignant neoplasm of right lung: Secondary | ICD-10-CM | POA: Diagnosis not present

## 2020-02-16 DIAGNOSIS — C7951 Secondary malignant neoplasm of bone: Secondary | ICD-10-CM | POA: Diagnosis not present

## 2020-02-16 DIAGNOSIS — C771 Secondary and unspecified malignant neoplasm of intrathoracic lymph nodes: Secondary | ICD-10-CM | POA: Diagnosis not present

## 2020-02-16 DIAGNOSIS — M84552D Pathological fracture in neoplastic disease, left femur, subsequent encounter for fracture with routine healing: Secondary | ICD-10-CM | POA: Diagnosis not present

## 2020-02-16 DIAGNOSIS — Z452 Encounter for adjustment and management of vascular access device: Secondary | ICD-10-CM | POA: Diagnosis not present

## 2020-02-20 DIAGNOSIS — J90 Pleural effusion, not elsewhere classified: Secondary | ICD-10-CM | POA: Diagnosis not present

## 2020-02-20 DIAGNOSIS — J910 Malignant pleural effusion: Secondary | ICD-10-CM | POA: Diagnosis not present

## 2020-02-20 DIAGNOSIS — Z7901 Long term (current) use of anticoagulants: Secondary | ICD-10-CM | POA: Diagnosis not present

## 2020-02-20 DIAGNOSIS — C50919 Malignant neoplasm of unspecified site of unspecified female breast: Secondary | ICD-10-CM | POA: Diagnosis not present

## 2020-02-20 DIAGNOSIS — C7802 Secondary malignant neoplasm of left lung: Secondary | ICD-10-CM | POA: Diagnosis not present

## 2020-02-20 DIAGNOSIS — C801 Malignant (primary) neoplasm, unspecified: Secondary | ICD-10-CM | POA: Diagnosis not present

## 2020-02-20 DIAGNOSIS — Z79899 Other long term (current) drug therapy: Secondary | ICD-10-CM | POA: Diagnosis not present

## 2020-02-20 DIAGNOSIS — C7951 Secondary malignant neoplasm of bone: Secondary | ICD-10-CM | POA: Diagnosis not present

## 2020-02-20 DIAGNOSIS — C50912 Malignant neoplasm of unspecified site of left female breast: Secondary | ICD-10-CM | POA: Diagnosis not present

## 2020-02-20 DIAGNOSIS — C7801 Secondary malignant neoplasm of right lung: Secondary | ICD-10-CM | POA: Diagnosis not present

## 2020-02-23 DIAGNOSIS — C50912 Malignant neoplasm of unspecified site of left female breast: Secondary | ICD-10-CM | POA: Diagnosis not present

## 2020-02-23 DIAGNOSIS — D630 Anemia in neoplastic disease: Secondary | ICD-10-CM | POA: Diagnosis not present

## 2020-02-23 DIAGNOSIS — C7951 Secondary malignant neoplasm of bone: Secondary | ICD-10-CM | POA: Diagnosis not present

## 2020-02-23 DIAGNOSIS — C771 Secondary and unspecified malignant neoplasm of intrathoracic lymph nodes: Secondary | ICD-10-CM | POA: Diagnosis not present

## 2020-02-23 DIAGNOSIS — C7801 Secondary malignant neoplasm of right lung: Secondary | ICD-10-CM | POA: Diagnosis not present

## 2020-02-23 DIAGNOSIS — C7802 Secondary malignant neoplasm of left lung: Secondary | ICD-10-CM | POA: Diagnosis not present

## 2020-02-26 DIAGNOSIS — R2689 Other abnormalities of gait and mobility: Secondary | ICD-10-CM | POA: Diagnosis not present

## 2020-02-26 DIAGNOSIS — R519 Headache, unspecified: Secondary | ICD-10-CM | POA: Diagnosis not present

## 2020-02-26 DIAGNOSIS — R200 Anesthesia of skin: Secondary | ICD-10-CM | POA: Diagnosis not present

## 2020-02-26 DIAGNOSIS — H532 Diplopia: Secondary | ICD-10-CM | POA: Diagnosis not present

## 2020-02-27 DIAGNOSIS — C7802 Secondary malignant neoplasm of left lung: Secondary | ICD-10-CM | POA: Diagnosis not present

## 2020-02-27 DIAGNOSIS — C7801 Secondary malignant neoplasm of right lung: Secondary | ICD-10-CM | POA: Diagnosis not present

## 2020-02-27 DIAGNOSIS — C7951 Secondary malignant neoplasm of bone: Secondary | ICD-10-CM | POA: Diagnosis not present

## 2020-02-27 DIAGNOSIS — C50912 Malignant neoplasm of unspecified site of left female breast: Secondary | ICD-10-CM | POA: Diagnosis not present

## 2020-02-27 DIAGNOSIS — D630 Anemia in neoplastic disease: Secondary | ICD-10-CM | POA: Diagnosis not present

## 2020-02-27 DIAGNOSIS — C771 Secondary and unspecified malignant neoplasm of intrathoracic lymph nodes: Secondary | ICD-10-CM | POA: Diagnosis not present

## 2020-02-29 DIAGNOSIS — C50919 Malignant neoplasm of unspecified site of unspecified female breast: Secondary | ICD-10-CM | POA: Diagnosis not present

## 2020-02-29 DIAGNOSIS — R1310 Dysphagia, unspecified: Secondary | ICD-10-CM | POA: Diagnosis not present

## 2020-02-29 DIAGNOSIS — J910 Malignant pleural effusion: Secondary | ICD-10-CM | POA: Diagnosis not present

## 2020-03-04 DIAGNOSIS — C7951 Secondary malignant neoplasm of bone: Secondary | ICD-10-CM | POA: Diagnosis not present

## 2020-03-04 DIAGNOSIS — C7801 Secondary malignant neoplasm of right lung: Secondary | ICD-10-CM | POA: Diagnosis not present

## 2020-03-04 DIAGNOSIS — D630 Anemia in neoplastic disease: Secondary | ICD-10-CM | POA: Diagnosis not present

## 2020-03-04 DIAGNOSIS — C50912 Malignant neoplasm of unspecified site of left female breast: Secondary | ICD-10-CM | POA: Diagnosis not present

## 2020-03-04 DIAGNOSIS — C7802 Secondary malignant neoplasm of left lung: Secondary | ICD-10-CM | POA: Diagnosis not present

## 2020-03-04 DIAGNOSIS — C771 Secondary and unspecified malignant neoplasm of intrathoracic lymph nodes: Secondary | ICD-10-CM | POA: Diagnosis not present

## 2020-03-06 DIAGNOSIS — C7951 Secondary malignant neoplasm of bone: Secondary | ICD-10-CM | POA: Diagnosis not present

## 2020-03-06 DIAGNOSIS — C7802 Secondary malignant neoplasm of left lung: Secondary | ICD-10-CM | POA: Diagnosis not present

## 2020-03-06 DIAGNOSIS — D630 Anemia in neoplastic disease: Secondary | ICD-10-CM | POA: Diagnosis not present

## 2020-03-06 DIAGNOSIS — C7801 Secondary malignant neoplasm of right lung: Secondary | ICD-10-CM | POA: Diagnosis not present

## 2020-03-06 DIAGNOSIS — C771 Secondary and unspecified malignant neoplasm of intrathoracic lymph nodes: Secondary | ICD-10-CM | POA: Diagnosis not present

## 2020-03-06 DIAGNOSIS — C50912 Malignant neoplasm of unspecified site of left female breast: Secondary | ICD-10-CM | POA: Diagnosis not present

## 2020-03-12 DIAGNOSIS — C7802 Secondary malignant neoplasm of left lung: Secondary | ICD-10-CM | POA: Diagnosis not present

## 2020-03-12 DIAGNOSIS — C7951 Secondary malignant neoplasm of bone: Secondary | ICD-10-CM | POA: Diagnosis not present

## 2020-03-12 DIAGNOSIS — C7801 Secondary malignant neoplasm of right lung: Secondary | ICD-10-CM | POA: Diagnosis not present

## 2020-03-12 DIAGNOSIS — C771 Secondary and unspecified malignant neoplasm of intrathoracic lymph nodes: Secondary | ICD-10-CM | POA: Diagnosis not present

## 2020-03-12 DIAGNOSIS — D630 Anemia in neoplastic disease: Secondary | ICD-10-CM | POA: Diagnosis not present

## 2020-03-12 DIAGNOSIS — C50912 Malignant neoplasm of unspecified site of left female breast: Secondary | ICD-10-CM | POA: Diagnosis not present

## 2020-03-14 DIAGNOSIS — C7802 Secondary malignant neoplasm of left lung: Secondary | ICD-10-CM | POA: Diagnosis not present

## 2020-03-14 DIAGNOSIS — C771 Secondary and unspecified malignant neoplasm of intrathoracic lymph nodes: Secondary | ICD-10-CM | POA: Diagnosis not present

## 2020-03-14 DIAGNOSIS — C50912 Malignant neoplasm of unspecified site of left female breast: Secondary | ICD-10-CM | POA: Diagnosis not present

## 2020-03-14 DIAGNOSIS — C7951 Secondary malignant neoplasm of bone: Secondary | ICD-10-CM | POA: Diagnosis not present

## 2020-03-14 DIAGNOSIS — C7801 Secondary malignant neoplasm of right lung: Secondary | ICD-10-CM | POA: Diagnosis not present

## 2020-03-14 DIAGNOSIS — D630 Anemia in neoplastic disease: Secondary | ICD-10-CM | POA: Diagnosis not present

## 2020-03-15 DIAGNOSIS — C7951 Secondary malignant neoplasm of bone: Secondary | ICD-10-CM | POA: Diagnosis not present

## 2020-03-15 DIAGNOSIS — C50912 Malignant neoplasm of unspecified site of left female breast: Secondary | ICD-10-CM | POA: Diagnosis not present

## 2020-03-15 DIAGNOSIS — C7801 Secondary malignant neoplasm of right lung: Secondary | ICD-10-CM | POA: Diagnosis not present

## 2020-03-15 DIAGNOSIS — C7802 Secondary malignant neoplasm of left lung: Secondary | ICD-10-CM | POA: Diagnosis not present

## 2020-03-17 DIAGNOSIS — M84552D Pathological fracture in neoplastic disease, left femur, subsequent encounter for fracture with routine healing: Secondary | ICD-10-CM | POA: Diagnosis not present

## 2020-03-17 DIAGNOSIS — C7802 Secondary malignant neoplasm of left lung: Secondary | ICD-10-CM | POA: Diagnosis not present

## 2020-03-17 DIAGNOSIS — C7801 Secondary malignant neoplasm of right lung: Secondary | ICD-10-CM | POA: Diagnosis not present

## 2020-03-17 DIAGNOSIS — C771 Secondary and unspecified malignant neoplasm of intrathoracic lymph nodes: Secondary | ICD-10-CM | POA: Diagnosis not present

## 2020-03-17 DIAGNOSIS — C7951 Secondary malignant neoplasm of bone: Secondary | ICD-10-CM | POA: Diagnosis not present

## 2020-03-17 DIAGNOSIS — Z452 Encounter for adjustment and management of vascular access device: Secondary | ICD-10-CM | POA: Diagnosis not present

## 2020-03-17 DIAGNOSIS — C50912 Malignant neoplasm of unspecified site of left female breast: Secondary | ICD-10-CM | POA: Diagnosis not present

## 2020-03-17 DIAGNOSIS — F418 Other specified anxiety disorders: Secondary | ICD-10-CM | POA: Diagnosis not present

## 2020-03-17 DIAGNOSIS — D630 Anemia in neoplastic disease: Secondary | ICD-10-CM | POA: Diagnosis not present

## 2020-03-19 DIAGNOSIS — C771 Secondary and unspecified malignant neoplasm of intrathoracic lymph nodes: Secondary | ICD-10-CM | POA: Diagnosis not present

## 2020-03-19 DIAGNOSIS — C7801 Secondary malignant neoplasm of right lung: Secondary | ICD-10-CM | POA: Diagnosis not present

## 2020-03-19 DIAGNOSIS — C50912 Malignant neoplasm of unspecified site of left female breast: Secondary | ICD-10-CM | POA: Diagnosis not present

## 2020-03-19 DIAGNOSIS — D630 Anemia in neoplastic disease: Secondary | ICD-10-CM | POA: Diagnosis not present

## 2020-03-19 DIAGNOSIS — C7802 Secondary malignant neoplasm of left lung: Secondary | ICD-10-CM | POA: Diagnosis not present

## 2020-03-19 DIAGNOSIS — C7951 Secondary malignant neoplasm of bone: Secondary | ICD-10-CM | POA: Diagnosis not present

## 2020-03-21 DIAGNOSIS — C7801 Secondary malignant neoplasm of right lung: Secondary | ICD-10-CM | POA: Diagnosis not present

## 2020-03-21 DIAGNOSIS — C771 Secondary and unspecified malignant neoplasm of intrathoracic lymph nodes: Secondary | ICD-10-CM | POA: Diagnosis not present

## 2020-03-21 DIAGNOSIS — C50912 Malignant neoplasm of unspecified site of left female breast: Secondary | ICD-10-CM | POA: Diagnosis not present

## 2020-03-21 DIAGNOSIS — C7951 Secondary malignant neoplasm of bone: Secondary | ICD-10-CM | POA: Diagnosis not present

## 2020-03-21 DIAGNOSIS — D630 Anemia in neoplastic disease: Secondary | ICD-10-CM | POA: Diagnosis not present

## 2020-03-21 DIAGNOSIS — C7802 Secondary malignant neoplasm of left lung: Secondary | ICD-10-CM | POA: Diagnosis not present

## 2020-03-22 DIAGNOSIS — C7801 Secondary malignant neoplasm of right lung: Secondary | ICD-10-CM | POA: Diagnosis not present

## 2020-03-22 DIAGNOSIS — C50912 Malignant neoplasm of unspecified site of left female breast: Secondary | ICD-10-CM | POA: Diagnosis not present

## 2020-03-22 DIAGNOSIS — C7951 Secondary malignant neoplasm of bone: Secondary | ICD-10-CM | POA: Diagnosis not present

## 2020-03-22 DIAGNOSIS — C771 Secondary and unspecified malignant neoplasm of intrathoracic lymph nodes: Secondary | ICD-10-CM | POA: Diagnosis not present

## 2020-03-22 DIAGNOSIS — C7802 Secondary malignant neoplasm of left lung: Secondary | ICD-10-CM | POA: Diagnosis not present
